# Patient Record
Sex: Female | Born: 1940 | Race: White | Hispanic: No | Marital: Single | Smoking: Former smoker
Health system: Southern US, Community
[De-identification: ages and names within clinical notes are randomized; demographics above are authoritative.]

## PROBLEM LIST (undated history)

## (undated) DIAGNOSIS — R079 Chest pain, unspecified: Secondary | ICD-10-CM

## (undated) DIAGNOSIS — Z860100 Personal history of colon polyps, unspecified: Secondary | ICD-10-CM

## (undated) DIAGNOSIS — E039 Hypothyroidism, unspecified: Secondary | ICD-10-CM

## (undated) DIAGNOSIS — Z9889 Other specified postprocedural states: Secondary | ICD-10-CM

## (undated) DIAGNOSIS — M069 Rheumatoid arthritis, unspecified: Secondary | ICD-10-CM

## (undated) DIAGNOSIS — J449 Chronic obstructive pulmonary disease, unspecified: Secondary | ICD-10-CM

## (undated) DIAGNOSIS — Z720 Tobacco use: Secondary | ICD-10-CM

## (undated) DIAGNOSIS — M8580 Other specified disorders of bone density and structure, unspecified site: Secondary | ICD-10-CM

## (undated) DIAGNOSIS — K5900 Constipation, unspecified: Secondary | ICD-10-CM

## (undated) DIAGNOSIS — N6019 Diffuse cystic mastopathy of unspecified breast: Secondary | ICD-10-CM

## (undated) DIAGNOSIS — M25571 Pain in right ankle and joints of right foot: Secondary | ICD-10-CM

## (undated) DIAGNOSIS — K649 Unspecified hemorrhoids: Secondary | ICD-10-CM

## (undated) DIAGNOSIS — M359 Systemic involvement of connective tissue, unspecified: Secondary | ICD-10-CM

## (undated) DIAGNOSIS — G894 Chronic pain syndrome: Secondary | ICD-10-CM

## (undated) DIAGNOSIS — F419 Anxiety disorder, unspecified: Secondary | ICD-10-CM

## (undated) DIAGNOSIS — E0789 Other specified disorders of thyroid: Secondary | ICD-10-CM

## (undated) DIAGNOSIS — R109 Unspecified abdominal pain: Secondary | ICD-10-CM

## (undated) DIAGNOSIS — N644 Mastodynia: Secondary | ICD-10-CM

## (undated) DIAGNOSIS — K219 Gastro-esophageal reflux disease without esophagitis: Secondary | ICD-10-CM

## (undated) DIAGNOSIS — R112 Nausea with vomiting, unspecified: Secondary | ICD-10-CM

## (undated) DIAGNOSIS — I701 Atherosclerosis of renal artery: Secondary | ICD-10-CM

## (undated) DIAGNOSIS — D649 Anemia, unspecified: Secondary | ICD-10-CM

## (undated) DIAGNOSIS — R0789 Other chest pain: Secondary | ICD-10-CM

## (undated) DIAGNOSIS — K5790 Diverticulosis of intestine, part unspecified, without perforation or abscess without bleeding: Secondary | ICD-10-CM

## (undated) DIAGNOSIS — K2970 Gastritis, unspecified, without bleeding: Secondary | ICD-10-CM

## (undated) HISTORY — DX: Diffuse cystic mastopathy of unspecified breast: N60.19

## (undated) HISTORY — PX: JOINT REPLACEMENT: SHX530

## (undated) HISTORY — DX: Anxiety disorder, unspecified: F41.9

## (undated) HISTORY — DX: Anemia, unspecified: D64.9

## (undated) HISTORY — DX: Atherosclerosis of renal artery: I70.1

## (undated) HISTORY — DX: Hypothyroidism, unspecified: E03.9

## (undated) HISTORY — DX: Other chest pain: R07.89

## (undated) HISTORY — DX: Constipation, unspecified: K59.00

## (undated) HISTORY — DX: Gastritis, unspecified, without bleeding: K29.70

## (undated) HISTORY — DX: Personal history of colon polyps, unspecified: Z86.0100

## (undated) HISTORY — DX: Unspecified abdominal pain: R10.9

## (undated) HISTORY — DX: Diverticulosis of intestine, part unspecified, without perforation or abscess without bleeding: K57.90

## (undated) HISTORY — DX: Chronic pain syndrome: G89.4

## (undated) HISTORY — PX: TONSILLECTOMY: SUR1361

## (undated) HISTORY — PX: LEG SURGERY: SHX1003

## (undated) HISTORY — DX: Pain in right ankle and joints of right foot: M25.571

## (undated) HISTORY — DX: Chest pain, unspecified: R07.9

## (undated) HISTORY — PX: CHOLECYSTECTOMY: SHX55

## (undated) HISTORY — DX: Other specified disorders of bone density and structure, unspecified site: M85.80

## (undated) HISTORY — DX: Mastodynia: N64.4

## (undated) HISTORY — DX: Chronic obstructive pulmonary disease, unspecified: J44.9

## (undated) HISTORY — DX: Unspecified hemorrhoids: K64.9

## (undated) HISTORY — DX: Gastro-esophageal reflux disease without esophagitis: K21.9

## (undated) HISTORY — DX: Personal history of colonic polyps: Z86.010

## (undated) HISTORY — DX: Tobacco use: Z72.0

## (undated) MED FILL — Ferumoxytol Inj 510 MG/17ML (30 MG/ML) (Elemental Fe): INTRAVENOUS | Qty: 17 | Status: AC

---

## 2001-07-12 ENCOUNTER — Ambulatory Visit (HOSPITAL_BASED_OUTPATIENT_CLINIC_OR_DEPARTMENT_OTHER): Admission: RE | Admit: 2001-07-12 | Discharge: 2001-07-12 | Payer: Self-pay | Admitting: Otolaryngology

## 2004-04-07 ENCOUNTER — Ambulatory Visit: Payer: Self-pay | Admitting: Unknown Physician Specialty

## 2004-04-13 ENCOUNTER — Ambulatory Visit: Payer: Self-pay | Admitting: Unknown Physician Specialty

## 2004-07-09 ENCOUNTER — Ambulatory Visit: Payer: Self-pay | Admitting: Cardiology

## 2004-07-28 ENCOUNTER — Ambulatory Visit: Payer: Self-pay | Admitting: Cardiology

## 2004-07-28 ENCOUNTER — Ambulatory Visit: Payer: Self-pay

## 2005-01-06 ENCOUNTER — Ambulatory Visit: Payer: Self-pay

## 2005-08-24 ENCOUNTER — Ambulatory Visit: Payer: Self-pay

## 2005-09-08 ENCOUNTER — Ambulatory Visit: Payer: Self-pay

## 2005-09-16 ENCOUNTER — Ambulatory Visit: Payer: Self-pay | Admitting: Orthopedic Surgery

## 2005-09-28 ENCOUNTER — Ambulatory Visit: Payer: Self-pay | Admitting: Orthopedic Surgery

## 2005-10-22 ENCOUNTER — Ambulatory Visit: Payer: Self-pay | Admitting: Internal Medicine

## 2005-11-16 ENCOUNTER — Ambulatory Visit: Payer: Self-pay | Admitting: Unknown Physician Specialty

## 2006-01-18 ENCOUNTER — Ambulatory Visit: Payer: Self-pay | Admitting: Internal Medicine

## 2006-06-21 ENCOUNTER — Ambulatory Visit: Payer: Self-pay | Admitting: Unknown Physician Specialty

## 2006-07-08 ENCOUNTER — Ambulatory Visit: Payer: Self-pay | Admitting: Cardiology

## 2006-07-21 ENCOUNTER — Ambulatory Visit: Payer: Self-pay | Admitting: Unknown Physician Specialty

## 2006-07-27 ENCOUNTER — Ambulatory Visit: Payer: Self-pay | Admitting: Internal Medicine

## 2006-08-01 ENCOUNTER — Ambulatory Visit: Payer: Self-pay

## 2006-08-23 ENCOUNTER — Ambulatory Visit: Payer: Self-pay | Admitting: Cardiology

## 2006-09-09 ENCOUNTER — Ambulatory Visit: Payer: Self-pay | Admitting: Pulmonary Disease

## 2006-11-02 ENCOUNTER — Ambulatory Visit: Payer: Self-pay | Admitting: Cardiology

## 2006-11-04 ENCOUNTER — Ambulatory Visit: Payer: Self-pay | Admitting: Pulmonary Disease

## 2006-11-30 ENCOUNTER — Encounter: Payer: Self-pay | Admitting: Cardiology

## 2006-11-30 ENCOUNTER — Ambulatory Visit: Payer: Self-pay

## 2007-01-05 ENCOUNTER — Ambulatory Visit: Payer: Self-pay | Admitting: Internal Medicine

## 2007-02-13 ENCOUNTER — Ambulatory Visit: Payer: Self-pay | Admitting: Internal Medicine

## 2007-02-15 ENCOUNTER — Ambulatory Visit: Payer: Self-pay | Admitting: Internal Medicine

## 2007-03-06 ENCOUNTER — Emergency Department (HOSPITAL_COMMUNITY): Admission: EM | Admit: 2007-03-06 | Discharge: 2007-03-07 | Payer: Self-pay | Admitting: Emergency Medicine

## 2007-03-08 ENCOUNTER — Ambulatory Visit: Payer: Self-pay | Admitting: Internal Medicine

## 2007-03-08 LAB — CONVERTED CEMR LAB: IgA: 135 mg/dL (ref 68–378)

## 2007-03-17 ENCOUNTER — Ambulatory Visit: Payer: Self-pay | Admitting: Internal Medicine

## 2007-03-17 LAB — CONVERTED CEMR LAB: BUN: 9 mg/dL (ref 6–23)

## 2007-03-24 ENCOUNTER — Ambulatory Visit: Payer: Self-pay | Admitting: Cardiology

## 2007-04-07 ENCOUNTER — Ambulatory Visit: Payer: Self-pay | Admitting: Cardiology

## 2007-04-10 ENCOUNTER — Ambulatory Visit: Payer: Self-pay | Admitting: Internal Medicine

## 2007-08-03 DIAGNOSIS — G894 Chronic pain syndrome: Secondary | ICD-10-CM

## 2007-08-03 DIAGNOSIS — Z860100 Personal history of colon polyps, unspecified: Secondary | ICD-10-CM | POA: Insufficient documentation

## 2007-08-03 DIAGNOSIS — Z8719 Personal history of other diseases of the digestive system: Secondary | ICD-10-CM

## 2007-08-03 DIAGNOSIS — D126 Benign neoplasm of colon, unspecified: Secondary | ICD-10-CM

## 2007-08-03 DIAGNOSIS — M949 Disorder of cartilage, unspecified: Secondary | ICD-10-CM

## 2007-08-03 DIAGNOSIS — K2970 Gastritis, unspecified, without bleeding: Secondary | ICD-10-CM | POA: Insufficient documentation

## 2007-08-03 DIAGNOSIS — R0789 Other chest pain: Secondary | ICD-10-CM

## 2007-08-03 DIAGNOSIS — K649 Unspecified hemorrhoids: Secondary | ICD-10-CM | POA: Insufficient documentation

## 2007-08-03 DIAGNOSIS — K2990 Gastroduodenitis, unspecified, without bleeding: Secondary | ICD-10-CM

## 2007-08-03 DIAGNOSIS — M899 Disorder of bone, unspecified: Secondary | ICD-10-CM | POA: Insufficient documentation

## 2007-08-03 DIAGNOSIS — K5730 Diverticulosis of large intestine without perforation or abscess without bleeding: Secondary | ICD-10-CM | POA: Insufficient documentation

## 2007-08-03 DIAGNOSIS — N6019 Diffuse cystic mastopathy of unspecified breast: Secondary | ICD-10-CM | POA: Insufficient documentation

## 2007-08-03 DIAGNOSIS — F411 Generalized anxiety disorder: Secondary | ICD-10-CM | POA: Insufficient documentation

## 2007-09-26 ENCOUNTER — Encounter: Payer: Self-pay | Admitting: Internal Medicine

## 2008-01-16 ENCOUNTER — Ambulatory Visit: Payer: Self-pay | Admitting: Vascular Surgery

## 2008-07-02 ENCOUNTER — Ambulatory Visit: Payer: Self-pay | Admitting: Cardiology

## 2009-01-09 ENCOUNTER — Telehealth: Payer: Self-pay | Admitting: Cardiology

## 2009-08-27 DIAGNOSIS — Z8639 Personal history of other endocrine, nutritional and metabolic disease: Secondary | ICD-10-CM

## 2009-08-27 DIAGNOSIS — I701 Atherosclerosis of renal artery: Secondary | ICD-10-CM | POA: Insufficient documentation

## 2009-08-27 DIAGNOSIS — J449 Chronic obstructive pulmonary disease, unspecified: Secondary | ICD-10-CM | POA: Insufficient documentation

## 2009-08-27 DIAGNOSIS — Z862 Personal history of diseases of the blood and blood-forming organs and certain disorders involving the immune mechanism: Secondary | ICD-10-CM

## 2009-08-27 DIAGNOSIS — F17200 Nicotine dependence, unspecified, uncomplicated: Secondary | ICD-10-CM | POA: Insufficient documentation

## 2009-08-27 DIAGNOSIS — E039 Hypothyroidism, unspecified: Secondary | ICD-10-CM | POA: Insufficient documentation

## 2009-08-27 DIAGNOSIS — J4489 Other specified chronic obstructive pulmonary disease: Secondary | ICD-10-CM | POA: Insufficient documentation

## 2009-08-27 DIAGNOSIS — K219 Gastro-esophageal reflux disease without esophagitis: Secondary | ICD-10-CM | POA: Insufficient documentation

## 2009-09-01 ENCOUNTER — Ambulatory Visit: Payer: Self-pay | Admitting: Cardiology

## 2009-10-01 ENCOUNTER — Ambulatory Visit: Payer: Self-pay | Admitting: Cardiology

## 2010-02-04 ENCOUNTER — Ambulatory Visit: Payer: Medicare Other | Admitting: Internal Medicine

## 2010-05-06 ENCOUNTER — Encounter (INDEPENDENT_AMBULATORY_CARE_PROVIDER_SITE_OTHER): Payer: Self-pay | Admitting: *Deleted

## 2010-07-09 NOTE — Letter (Signed)
Summary: Appointment - Missed  [facility], Main Office  [address]   [place], [place] [address]   Phone: [phone]  Fax: [phone]     May 06, 2010 MRN: [id number]   ESABELLA STOCKINGER [address] [place], [place]  [address]   Dear Ms. Erling Conte,  Our records indicate you missed your appointment in September with Dr. Riley Kill. It is very important that we reach you to reschedule this appointment. We look forward to participating in your health care needs. Please contact us at the number listed above at your earliest convenience to reschedule this appointment.     Sincerely, Neurosurgeon Team LG

## 2010-07-09 NOTE — Assessment & Plan Note (Signed)
Summary: F1Y   Visit Type:  1 year follow up  CC:  Pain on center of breast.  History of Present Illness: Has chronic acid reflux.  Took therapy for pelvic floor muscles, so she had physical therapy.  SHe has lost a fair amount of weight.   Took therapy for rectum, and was able to start having BM's again.  She gets some chest discomfort, mostly related to when her bowels are not working appropriately.   She still does smoke, probably less than a pack per day.    Current Medications (verified): 1)  Ascorbic Acid 250 Mg Tabs (Ascorbic Acid) .... Take 1 Tablet By Mouth Once A Day 2)  Calcium 600+d Plus Minerals 600-400 Mg-Unit Tabs (Calcium Carbonate-Vit D-Min) .... Take 1 Tablet By Mouth Once A Day 3)  Vitamin D 1000 Unit Tabs (Cholecalciferol) .... Take 1 Tablet By Mouth Once A Day 4)  Premarin 0.625 Mg/gm Crea (Estrogens, Conjugated) .... As Directed 5)  Hydroxychloroquine Sulfate 200 Mg Tabs (Hydroxychloroquine Sulfate) .... 2 Tablets By Mouth Once A Day 6)  Levothyroxine Sodium 50 Mcg Tabs (Levothyroxine Sodium) .... Take 1 Tablet By Mouth Once A Day 7)  Methotrexate 2.5 Mg Tabs (Methotrexate Sodium) .... 8 Tablets Once A Week 8)  Multivitamins  Tabs (Multiple Vitamin) .... Take 1 Tablet By Mouth Once A Day 9)  Nexium 40 Mg Cpdr (Esomeprazole Magnesium) .... Two Times A Day 10)  Prednisone 1 Mg Tabs (Prednisone) .... Take 1 Tablet By Mouth Once A Day 11)  Zantac 150 Mg Tabs (Ranitidine Hcl) .... Take 1 Tablet By Mouth Once A Day 12)  Sertraline Hcl 50 Mg Tabs (Sertraline Hcl) .... Take 1 Tablet By Mouth Once A Day 13)  Trazodone Hcl 50 Mg Tabs (Trazodone Hcl) .... At Bedtime 14)  Cymbalta 20 Mg Cpep (Duloxetine Hcl) .... At Bedtime 15)  Align  Caps (Probiotic Product) .... As Needed 16)  Cyclobenzaprine Hcl 10 Mg Tabs (Cyclobenzaprine Hcl) .... As Needed 17)  Ibuprofen 600 Mg Tabs (Ibuprofen) .... As Needed  Allergies (verified): 1)  ! * Symmetral 2)  ! * Etrafon 3)  ! *  Desbatal  Vital Signs:  Patient profile:   70 year old female Height:      65 inches Weight:      103.50 pounds BMI:     17.29 Pulse rate:   78 / minute Pulse rhythm:   regular Resp:     18 per minute BP sitting:   124 / 82  (left arm) Cuff size:   regular  Vitals Entered By: Vikki Ports (September 01, 2009 3:25 PM)  Physical Exam  General:  Well developed, well nourished, in no acute distress. Head:  normocephalic and atraumatic Eyes:  PERRLA/EOM intact; conjunctiva and lids normal. Lungs:  Prolonged expiration. Heart:  NOrmal S1 and S2.  No murmur, rub, or gallop. Abdomen:  Bowel sounds positive; abdomen soft and non-tender without masses, organomegaly, or hernias noted. No hepatosplenomegaly.  No abdominal bruit.   Rectal:  normal external exam   EKG  Procedure date:  09/01/2009  Findings:      NSR. WNl.  Impression & Recommendations:  Problem # 1:  CHEST PAIN, ATYPICAL (ICD-786.59) symptoms likely from reflux.  Not likely cardiac.  Will do POET study  (routine treadmill).  Nuclear study in 2008 was not ischemic, with normal perfusion and EF 78%, 7.5 minutes of exercise, normal contractility.  Therefore, simple GXT.  Discussion re: mechanism of ACS. Orders: EKG w/ Interpretation (93000)  Treadmill (Treadmill)  Problem # 2:  TOBACCO ABUSE (ICD-305.1) Clearly needs to stop.  Long discussion about this. Orders: EKG w/ Interpretation (93000) Treadmill (Treadmill)  Patient Instructions: 1)  Your physician recommends that you continue on your current medications as directed. Please refer to the Current Medication list given to you today. 2)  Your physician has requested that you have an exercise tolerance test.  For further information please visit [email or web address]  Please also follow instruction sheet, as given.  Appended Document: F1Y BP is normal.  Has no abdominal bruit.  Of note, CT previously revealed LRA with calcified lesion of 80%.

## 2010-10-20 NOTE — Assessment & Plan Note (Signed)
[facility]                            CARDIOLOGY OFFICE NOTE   NAME:[name], [name]                           MRN:          [id number]  DATE:11/02/2006                            DOB:          05-03-1941    [name] is in for follow-up.  She was unable to see the  gastroenterologist as they would not take her referral.  She has been  seen previously in [facility] and wants to be seen by [facility].  From a cardiac standpoint, she has not had any chest  pain or significant shortness of breath.  Her myocardial perfusion study  was not markedly abnormal.  She has seen a pulmonary doctor.  She does  continue to smoke about 15 cigarettes a day.   PHYSICAL EXAMINATION:  VITAL SIGNS:  Today blood pressure 116/70, pulse  79, there is slight decrease in prolonged expiration.  HEART:  Rhythm is regular.  NECK:  There are no carotid bruits.   EKG reveals normal sinus rhythm with incomplete right bundle branch  block.   We plan to get a two-dimensional echocardiogram on her and I will see  her back in follow-up in 6 months.  She will continue to follow up in  pulmonary medicine and we will make a referral to GI.     Arturo Morton. Riley Kill, MD, [facility]  Electronically Signed    TDS/MedQ  DD: 11/02/2006  DT: 11/02/2006  Job #: [id number]

## 2010-10-20 NOTE — Assessment & Plan Note (Signed)
[facility]                         GASTROENTEROLOGY OFFICE NOTE   NAME:[name], [name]                    MRN:          [id number]  DATE:02/13/2007                            DOB:          12/22/1940    CHIEF COMPLAINT:  Followup constipation.   Ms. [name] tried a MiraLax purge and MiraLax daily, but over time that  did not seem to help.  She increased the MiraLax to twice a day and  eventually ended up adding 2 Dulcolax a day, and for the past several  days of that she has had some bowel movements.  She feels she had very  infrequent bowel movements for a few weeks prior to this. She gets lower  abdominal pain radiating up into the chest and a lot of gas.  This is  making her life somewhat difficult.  She has been started on  methotrexate in the interim.  Her other medications are reviewed and  otherwise unchanged from my notes.  She has questions about whether or  not she should take Protonix given that she has osteoporosis.  She did  not start Boniva as recommended by Dr. Judithann Sheen.  She says her thyroid  problem is just mildly low.  She wonders about seeing an  endocrinologist about that.  She does not seem to have any other new  symptoms.  In fact, these symptoms are fairly chronic and similar to  what she has had before.   PHYSICAL EXAMINATION:  VITAL SIGNS:  She is 5 feet 5 inches, weight 109  pounds, looking appropriate for age.  Pulse 86, blood pressure 86/58.  ABDOMEN: Soft, not very distended if at all.  Bowel sounds are present.  There is some mild tenderness in the right and left lower quadrant  without mass.   ASSESSMENT:  This lady has constipation, predominantly irritable bowel  syndrome.  She is perhaps a little better at this time, but the MiraLax  did not work as well as I had hoped. It sounds like she has been on  Amitiza in the past, perhaps at the 24 mcg dose, and it caused nausea.  The bloating and gaseousness suggest the  possibility of bacterial  overgrowth.   PLAN:  1. I think a trial of Xifaxan 400 mg b.i.d. for 10 days is worth a      try.  2. Continue the current regimen.  We will try to go to every other day      with the Dulcolax.  3. Consider low-dose Amitiza depending upon these results.  4. Further plans pending the above.  I will see her back in 2 months,      but she is to contact me after she completes the 10-day course of      Xifaxan to see if that helps.  She is taking some sort of a      probiotic.  I had given her Align samples, but she switched to      something different.  5. Since she has gastroesophageal reflux disease, it sounds to me like      she should continue her  proton pump inhibitor therapy.  I think      that perhaps Prednisone and smoking did more to cause osteoporosis      than Protonix, though that is a risk factor.  She should take her      Boniva, I think, and I have asked her to follow up with Dr. Judithann Sheen      about that.  6. If she is not on calcium and vitamin D, that would be appropriate.      If she is on that, probably needs more and will discuss that with      her when she comes back.     Iva Boop, MD,FACG  Electronically Signed    CEG/MedQ  DD: 02/13/2007  DT: 02/13/2007  Job #: [id number]   cc:   Aram Beecham, MD

## 2010-10-20 NOTE — Assessment & Plan Note (Signed)
[facility]                         GASTROENTEROLOGY OFFICE NOTE   NAME:[name], [name]                           MRN:          [id number]  DATE:04/10/2007                            DOB:          [birth year]    CHIEF COMPLAINT:  Followup of constipation.   HISTORY OF PRESENT ILLNESS:  She continues to have problems with severe  constipation.  Her weight is really stable at this point since September  at 109 pounds.  I went ahead with a CT scan angiogram which showed no  mesenteric problems.  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  She tells me Dr. Riley Kill did talk to  her about this and indicated that he would not do anything further.  I  think that makes sense.  Of note, she was seen in October as well and  she was 107 pounds.   MEDICATIONS:  Medications are listed and reviewed in the chart.  1. MiraLax two scoops a day, sometimes one.  2. Dulcolax suppositories and pills intermittently.  3. Protonix 40 mg daily.  4. Caltrate Plus D 600 daily.  5. Centrum Silver daily.  6. Vitamin C 500 mg daily.   MEDICATION ALLERGIES:  Reviewed and updated.   PAST MEDICAL HISTORY:  Reviewed and updated, unchanged from previously.   Note, she has been taking something every day because she feels like she  has inadequate defecation and small stools.  There is a variety of  laxatives she has been using.  Lately, she has been taking extra MiraLax  and Dulcolax tablets.  Sometimes a Dulcolax suppository.  She tells me  Fleet's enemas do not really seem to work.   I reviewed the CT angio with her.  She was on Amitiza in the past at 24  mcg that caused profound nausea.   ASSESSMENT:  Constipation predominant irritable bowel syndrome.  Refractory to typical therapies.  We have tried antibiotics.  She has  tried high dose Amitiza. She has tried Clinical biochemist.   She had previously had  endoscopic evaluations, see my note of January 05, 2007, elsewhere.  Nothing seems to have helped her.  These are chronic problems.  She had  a 5 mm adenoma in 2005.   She has no antibodies for Celiac disease. It was negative.  She also had  a colonoscopy in 2008 which was negative as far as polyps or cancer.  She had internal hemorrhoids, diverticulosis and a tortuous colon.   RECOMMENDATIONS/PLANS:  1. At this point we will try the low dose Amitiza, 8 mcg b.i.d.  2. Also continue her regimen of MiraLax twice a day.  She add Dulcolax      suppositories and perhaps an enema using an enema bag with a 500 cc      warm water enema to see if that helps.  She will be referred to      [facility], with Dr. Dorita Fray, who is a constipation expert.  Note:  I thought I had done a Sitzmark study but that has not been      done.  I will defer this to the physicians in [place] to do so.      She may need an anorectal manometry.  She understands the plan.     Iva Boop, MD,FACG  Electronically Signed    CEG/MedQ  DD: 04/10/2007  DT: 04/11/2007  Job #: [id number]   cc:   [facility]  Aram Beecham, M.D.  Arturo Morton. Riley Kill, MD, [facility]

## 2010-10-20 NOTE — Assessment & Plan Note (Signed)
[facility]                         GASTROENTEROLOGY OFFICE NOTE   NAME:[name], [name]                           MRN:          [id number]  DATE:03/08/2007                            DOB:          08-08-1940    CHIEF COMPLAINT:  Persistent gas and bloating and constipation. See  previous note.   I have her Xifaxan for 10 days. That did not seem to help. She still has  these attacks as what she describes as gas with severe diffuse abdominal  pain and bloating. She is using Dulcolax 2 tablets a day and MiraLax  twice a day and complains that she is not really moving her bowels. She  has these watery small bowel movements. She chronically feels sore in  her abdomen. In the ER, they gave her a GI cocktail and an enema. It did  not provide relief. It had been several days before she had what she  would think was an adequate bowel movement.   MEDICATIONS:  Are listed and reviewed in the chart. Otherwise, she is  confused about Protonix and a probiotic that she is taking; she is  taking some type of a homeopathic probiotic after I had previously given  her Align which she says did not help. She says that she has had these  problems for 30 years and is concerned about them still.   She has also tried some coffee enemas without relief. She says she is  not really using any other homeopathic remedies.   PHYSICAL:  Weight 107 pounds, pulse 68, blood pressure 104/68. The  abdomen is soft. She is diffusely tender to superficial palpation  without organomegaly or mass.   ASSESSMENT:  This is most consistent with irritable-bowel phenomenon  with constipation-predominant irritable bowel and some alternating  habits at times.   PLAN:  1. She should not use the Dulcolax every day, waiting every few days      if she has not had a decent bowel habit to try that.  2. She is to continue the MiraLax.  3. Check celiac sprue antibiotics with tissue transglutaminase  antibody and an IgA level.  4. Abdominal film, two view, to be taken.  5. I have discussed low-dose amitiza. She had a terrible time with 24      mcg and said she did not move her bowels with that.  6. Trial of Florastor daily.  7. Telephone call pending results of these labs. She may need to go to      the [facility] functional bowel disorders clinic. She does not      really have obvious post-prandial pain and persistent weight loss,      so given that and the chronicity of these symptoms, ischemia is      less likely. She has lost some weight over the last 1-2 yrs. CT      angiogram of abdomen may be appropriate. Adhesions are another      possibility, though she had a small-bowel series a      couple of years ago.  8. Add dicyclomine 20  mg p.r.n.     Pamela Boop, MD,FACG  Electronically Signed    CEG/MedQ  DD: 03/08/2007  DT: 03/09/2007  Job #: [id number]   cc:   Aram Beecham, M.D.

## 2010-10-20 NOTE — Consult Note (Signed)
VASCULAR SURGERY CONSULTATION   [name], [name]  DOB:  1940-10-07                                       01/16/2008  HYQMV#:[id number]   I saw the patient in the office today in consultation concerning a left  renal artery stenosis.  She was referred by Dr. Vincente Poli.  This is a  pleasant 70 year old woman who has been having problems with chronic  abdominal pain, weight loss and loss of appetite.  On 11/20/2007 she  underwent a CT angiogram to work this up.  The celiac axis and SMA were  noted to be widely patent.  She was noted to have two renal arteries on  the right and two renal arteries on the left.  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  She had only mild aortoiliac atherosclerotic disease.   The patient states she has really never had problems with her blood  pressure and has no history of renal insufficiency.  She has had no  hematuria.  She is currently on no medications for her blood pressure.  She has apparently undergone a fairly extensive workup from a GI  standpoint and was told that she has a slow colon.  She also has  reflux and apparently some spasm in the rectum.   She denies any history of claudication, rest pain, nonhealing ulcers.  She has had no history of DVT or phlebitis.   PAST MEDICAL HISTORY:  Is fairly unremarkable.  She denies any history  of diabetes, hypertension, hypercholesterolemia, history of previous  myocardial infarction, history of congestive heart failure or history of  COPD.   FAMILY HISTORY:  She had a brother who had open heart surgery in his 79s  for coronary artery disease.   SOCIAL HISTORY:  She is married.  She has one child.  She smokes a pack  per day of cigarettes, has been smoking for as long she can remember.   Her review of systems and medications are documented on the medical  history form in her chart.   PHYSICAL EXAMINATION:  General:  This  is a pleasant 70 year old woman  who appears her stated age.  Vital signs:  Blood pressure is 101/70,  heart rate is 89.  Neck:  Is supple.  There is no cervical  lymphadenopathy.  I do not detect any carotid bruits.  Lungs:  Are clear  bilaterally to auscultation.  Cardiac:  She has a regular rate and  rhythm.  Abdomen:  Soft and nontender.  I cannot palpate an aneurysm.  I  cannot appreciate an abdominal bruit.  Vascular:  She has palpable  femoral, popliteal and pedal pulses.  I cannot appreciate femoral  bruits.  She has no significant lower extremity swelling.   I think that her left inferior pole renal artery stenosis is  asymptomatic and for this reason would not recommend any further workup  such as arteriography or consideration for intervention.  Likewise the  CT angiogram did not show any evidence of significant mesenteric artery  occlusive disease and I feel it is unlikely that her symptoms are  related to this given these findings.  The films were done at the  [facility] office and therefore I do not have access to the actual films  but the patient is willing to bring  these in for me to review and I  would be happy to review these once I have the actual films.  Finally,  she does complain of some leg pain at times.  However, I do not get any  clear-cut history of claudication, rest pain or nonhealing ulcers.  She  has palpable pedal pulses.  If in the future if she develops problems  with her blood pressure or evidence of renal insufficiency then I would  recommend further evaluation of the renal artery stenosis.  I will be  happy to see her back if any new vascular issues arise.   Di Kindle. Edilia Bo, M.D.  Electronically Signed  CSD/MEDQ  D:  01/16/2008  T:  01/17/2008  Job:  [id number]   cc:   Marcelino Duster L. Vincente Poli, M.D.  Dr Charyl Dancer

## 2010-10-20 NOTE — Letter (Signed)
April 07, 2007    Rica Records, MD  [address]  [place], [place] [address]   RE:  [name], SINGLE  MRN:  [id number]  /  DOB:  May 22, 1941   Dear Dr. Judithann Sheen:   I had the pleasure of seeing [name] in the office today in followup.  She really is asymptomatic from a cardiac standpoint.  She has seen the  pulmonologist here and she has been seeing gastroenterology for chronic  constipation.  They have made some changes in her medicines.  By CT  scanning, she does have a short segment in the inferior renal artery.  She has not been hypertensive, nor has she had significant renovascular  disease.  She also has mild aortoiliac atheromatous changes without  stenosis.  She feels well.   CURRENT MEDICATIONS:  1. Vitamin C 500 mg daily.  2. Centrum Silver daily.  3. Caltrate 600 D+.  4. Dulcolax daily.   PHYSICAL:  The blood pressure is 110/76, pulse 80.  There is slight decrease in breath sounds with some prolonged  expiration.  Cardiac rhythm is regular without a significant murmur.  There is no extremity edema.   ELECTROCARDIOGRAM:  Demonstrates normal sinus rhythm with incomplete  right bundle branch block.   I have strongly encouraged her and spent quite a bit of time with her  counseling her on smoking cessation.  Also, I think she needs a lipid  profile, and I thought it would be most appropriate to do this in your  office.  I have asked her to contact you for this.  With the renal  artery stenosis, if  her LDL is elevated, it would be reasonable, it seems, to consider  reasonably aggressive therapy for this.  Certainly, I would leave this  to your discretion.  Thanks for allowing me to share in her care.    Sincerely,      Arturo Morton. Riley Kill, MD, [facility]  Electronically Signed    TDS/MedQ  DD: 04/07/2007  DT: 04/08/2007  Job #: [id number]   CC:    Iva Boop, MD,FACG

## 2010-10-20 NOTE — Assessment & Plan Note (Signed)
[facility]                             PULMONARY OFFICE NOTE   NAME:[name]                           MRN:          [id number]  DATE:11/04/2006                            DOB:          June 26, 1940    I saw [name] in followup today for her tobacco abuse.   Since her last visit, she had undergone pulmonary function tests.  These  showed a post-bronchodilator FEV1/FVC ratio of 76%.  Her FEV1 was 2.52,  which was 11.2% of predicted. Her FVC was 3.32, which was 108%  predicted.  There was no significant bronchodilator response.  Her total  lung capacity was 5.30 liters, which was 1.05% predicted.  Her diffusion  capacity was 99% predicted.  Overall, these results are consistent with  the essential normal pulmonary function tests.   She says that her breathing was reasonably stable.  She has not started  the use of the Spiriva that I had given her at her last visit.  She had  apparently confused this with Advair and was concerned that she could  develop osteoporosis from this.  She also says that she had bought the  nicotine patches but never actually started using them.  She continues  to smoke cigarettes.   I have had an extensive conversation with her again regarding the  importance of smoking cessation.  I have emphasized to her that while we  did not see any evidence for significant air flow obstruction on her  pulmonary function tests from today, it is certainly possible that her  breathing symptoms and her pulmonary function tests could get worse in  the future if she did not stop smoking.   I have given her a prescription for Chantix, and I have discussed with  her the use of this as well as the possible side effects.  She says that  she would like to think about whether she would want to try using the  Chantix or not.  I have advised her that if she decides to use the  Chantix, she should call to schedule a follow-up appointment with me  about 4-6 weeks after she starts using the medication, otherwise I plan  on following up with her in approximately one year, at which time I  would like for her to undergo a follow-up chest x-ray.     Coralyn Helling, MD  Electronically Signed    VS/MedQ  DD: 11/04/2006  DT: 11/04/2006  Job #: [id number]   cc:   Arturo Morton. Riley Kill, MD, [facility]

## 2010-10-20 NOTE — Assessment & Plan Note (Signed)
[facility]                         GASTROENTEROLOGY OFFICE NOTE   NAME:[name], [name]                MRN:          [id number]  DATE:01/05/2007                            DOB:          16-Jul-1940    REQUESTING PHYSICIAN:  Shawnie Pons.   REASON FOR CONSULTATION:  Gas and constipation.   ASSESSMENT:  A 70 year old white woman with what sounds like  constipation-predominant irritable bowel syndrome.  She has had multiple  workups in the past.  It clearly seems like a functional syndrome to me.   RECOMMENDATIONS AND PLAN:  1. She is somewhat obstipated at this time.  She is to try a MiraLax      purge and then 1-2 doses of MiraLax daily on a regular basis.  If      that does not help, she is to call me back.  2. A trial of Align daily to try to alleviate gas and bloating.  3. Return to see me in 1 month for reassessment.   HISTORY:  [name] is complaining of problems of epigastric pain,  distension and bloating associated with difficulty in defecation.  There  has been, occasionally, nausea and vomiting at times of small amounts.  She has had some bright red blood per rectum after an enema.  Her  husband indicates she has tried some MiraLax and other laxatives like  that, but she has not been on something consistently.  She had been on  doxepin for years for insomnia, and she believes that that has  contributed to some of her problems.  She has had extensive workup  elsewhere.  She believes she has been losing some weight.  When she does  move her bowels she gets relief.  There is no real spontaneous bleeding  that I am aware of.  She is not describing any diarrhea.  There is no  reflux, there is no dysphagia, i.e., no classic reflux symptoms that I  can see at this time.  She is on Protonix.  Part of her previous workup  through Dr. Mechele Collin, the gastroenterologist at the [facility] in  [place], she has had a gastric emptying study  on June 21, 2006  which showed rapid emptying with 100% of administered meal clearing at  95 minutes.  Abdominal films with nonspecific gas patterns.  Multiple  times for the x-rays.  Upper GI with small bowel, November 16, 2005, showed  a small amount of reflux, a small duodenal diverticulum, otherwise  normal exam to the ileum.  CT abdomen and pelvis, August 24, 2005, normal  without significant abnormality.  Colonoscopy, July 21, 2006,  internal hemorrhoids, sigmoid diverticulosis, tortuous colon which  required good sedation and external pressure.  She was given 100 mg  phentanyl, 5 mg midazolam, 12.5 promethazine, and 4 mg Zofran.  Exam was  to the terminal ileum.  Upper GI endoscopy, July 21, 2006, showed  small hiatus hernia about 2 cm with EG junction at 38 cm.  Patchy,  erythematous mucosa in the gastric antrum was found and biopsies.  Biopsies demonstrated reactive changes with smooth muscle ingrowth into  the lamina.  She had previously had a colonoscopy in 2005 with a  tubular adenoma at that point.   CURRENT MEDICATIONS:  1. Celebrex 200 mg daily.  2. Plaquenil 400 mg daily.  3. Prednisone 1 mg daily.  4. Cymbalta 30 mg daily.  5. Synthroid 50 mcg daily.  6. Protonix 40 mg 2 each day.  7. Trazodone 150 mg at bedtime.  8. Folic acid 1 mg daily.  9. Flexeril has been used p.r.n.   DRUG ALLERGIES:  SYMMETREL, ETRAFON, DESTAL.   Additional supplements include vitamin C, Centrum Silver, and Caltrate  Plus.   PAST MEDICAL HISTORY:  1. As described in the HPI.  2. Hypothyroidism.  3. Chronic obstructive pulmonary disease.  4. Gastroesophageal reflux disease.  5. Anxiety.  6. Chronic constipation/irritable bowel syndrome.  7. Adenomatous polyp of the colon, 2005, none 2008.  8. Diverticulosis.  9. Hiatal hernia.  10.Atypical chest pain.  11.Bladder polyps.  12.Fibrocystic breast disease.  13.Rheumatoid arthritis.  14.Chronic pain syndrome.  15.Osteopenia.   16.Prior abnormal Pap smears.  17.History of abnormal pancreas on CT scan but negative endoscopic      ultrasound, Dr. Daisey Must, [facility], 2000.  18.Prior cholecystectomy.  19.Prior appendectomy.  20.Prior oophorectomy.  21.Prior cataract surgery.   FAMILY HISTORY:  Brother with heart disease, brother with prostate  cancer, half sister has diabetes, no colon polyps or cancer.   SOCIAL HISTORY:  The patient is divorced.  She is here with her  companion (I inadvertently said husband in the previous HPI).  She has 1  daughter.  She smokes a pack per day.  No alcohol or drug use.   REVIEW OF SYSTEMS:  Chronic back and joint pain, frequent cough,  anxiety,   PHYSICAL EXAMINATION:  Physical exam reveals a thin but well-developed,  middle-aged white woman in no acute distress.  Weight 109 pounds, blood pressure 110/68, pulse 60.  EYES:  Anicteric.  ENT:  Normal mouth, posterior pharynx.  NECK:  Supple, no thyromegaly or mass.  CHEST:  Clear.  HEART:  S1, S2, no murmurs, rubs, or gallops.  ABDOMEN:  Shows some mild tenderness below the umbilicus and some  fullness there.  I think this is probably some palpable colon, actually.  There is no discrete mass, there is no other organomegaly or mass.  RECTAL EXAM:  In the presence of female medical staff shows normal tone,  good squeeze, no abnormal distention, no mass, no rectocele.  EXTREMITIES:  No edema.  SKIN:  No rash that I can see.  PSYCH:  She is alert and oriented x3.  Appropriate affect, perhaps  slightly anxious, but, overall, normal.   I have reviewed multiple medical records from the [facility].  Note  that she has seen Dr. Caryl Ada at [facility] as well in the past.  Her  primary care physician is Dr. Aram Beecham.   Would be interesting to know if she has ever been tested with antibodies  for celiac disease.  I suspect she has, but we will try to clarify that  when she returns.     Iva Boop, MD,FACG  Electronically Signed    CEG/MedQ  DD: 01/11/2007  DT: 01/11/2007  Job #: [id number]   cc:   Arturo Morton. Riley Kill, MD, [facility]  Aram Beecham, MD

## 2010-10-20 NOTE — Assessment & Plan Note (Signed)
[facility]                            CARDIOLOGY OFFICE NOTE   NAME:[name]                  MRN:          [id number]  DATE:07/02/2008                            DOB:          07-Jul-1940    [name] is here for a followup visit.  She generally is quite stable.  She is not having any ongoing chest pain.  She does have a little bit of  muscle aches from time to time.  They had placed her on methotrexate for  her rheumatoid arthritis and she got scared and stopped it.  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  This was noted by CT  scanning.  She has not been significantly hypertensive in the past and  she has seen Dr. Waverly Ferrari in followup about this.  He has  recommended conservative course of management.   She also had some discomfort behind the sternum, which she thinks may be  related to indigestion.  There is absolutely no diaphoresis or shortness  of breath.  It is there for some prolonged period.  She does gets hoarse  at times.   Her medications include:  1. Pantoprazole 40 mg.  2. Fish oil daily.  3. __________ daily.  4. Plaquenil 200 mg two tablets daily.  5. __________ 1 daily.  6. Nicotine patch which she is using.  7. Vitamin C.  8. Centrum Silver.  9. Caltrate.  10.Celebrex 200 mg daily.  11.Prednisone 10 mg daily.  12.Synthroid 50 mcg daily.  13.Zoloft.  14.Dulcolax.  15.Vitamin D.  16.Finally, she is also taking a trazodone at night.  It has been cut      back because of constipation.   PHYSICAL EXAMINATION:  VITAL SIGNS:  Today, blood pressure 130/90, the  weight is 103 pounds, the pulse is 72.  HEENT:  Carotid upstrokes are brisk without bruits.  LUNGS:  The lung fields are clear.  There is no definite tenderness on  the chest noted.  EXTREMITIES:  No edema.  The blood pressure is equal in both arms.   The EKG  reveals sinus rhythm, incomplete right bundle.   IMPRESSION:  1. Continued tobacco use.  2. Evidence of renal artery stenosis without significant hypertension.  3. History of constipation.  4. Rheumatoid arthritis.   PLAN:  1. Return to clinic in 1 year.  2. At that time, a renal artery ultrasound will be recommended for      followup.     Arturo Morton. Riley Kill, MD, [facility]  Electronically Signed    TDS/MedQ  DD: 07/02/2008  DT: 07/03/2008  Job #: [id number]

## 2010-10-23 ENCOUNTER — Ambulatory Visit: Payer: Medicare Other | Admitting: Unknown Physician Specialty

## 2010-10-23 NOTE — Assessment & Plan Note (Signed)
[facility]                            CARDIOLOGY OFFICE NOTE   NAME:[name]                           MRN:          [id number]  DATE:07/08/2006                            DOB:          [birth year]    [name] is sent back through the courtesy of Dr. Lynnae Prude.  She  is a 70 year old female who has had a history of intermittent chest  discomfort.  The discomfort for the most part is not exertional,  although she did have some on the treadmill the other night.  She has  had a gastric emptying study and has had an extensive GI workup.  She is  scheduled for endoscopy on February 14.  Unfortunately the patient  smokes about 15-20 cigarettes a day; has a chronic inflammatory disease  in the form of rheumatoid arthritis for which she is on prednisone,  Plaquenil, and Celebrex; and also has a family history of heart disease  particularly in her brother.  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  She is seen at [facility] for her rheumatoid arthritis and has  been on a prednisone taper.  When she gets these episodes she becomes  hot and sweaty.  She takes Gaviscon which relieves some of the symptoms  and often she throws up.   PAST MEDICAL HISTORY:  Outlined previously in the chart.   MEDICATIONS:  1. Celebrex 200 mg daily.  2. Macrobid 100 mg p.r.n.  3. Plaquenil 200 mg two tablets daily.  4. Prednisone 1 mg daily.  5. Cymbalta 30 mg daily.  6. Synthroid 50 mcg daily.  7. Protonix 40 mg two tablets daily.  8. Trazodone.  9. Folic acid.   PHYSICAL EXAMINATION:  Blood pressure is 120/78, the pulse is 78.  LUNGS:  Fields are clear.  CARDIAC:  Rhythm is regular, the PMI is nondisplaced.  There is normal  first and second heart sounds, there is no murmurs, rubs, or gallops.   The electrocardiogram demonstrates normal sinus rhythm and essentially  within normal limits.   IMPRESSION:  1. Chest pain.  More than likely, the  patient's pain is not cardiac in      origin.  She has had this for quite some time, it is not clearly      exertion related.  Despite occasional and prior severe events she      has never had any known cardiac event and her current      electrocardiogram is unremarkable.  2. She has rheumatoid arthritis.  3. Smoker.   PLAN:  The patient's chest discomfort is certainly atypical for coronary  artery disease.  Its intermittent nature, lack of response to exercise,  and associated symptoms do not suggest a cardiac etiology.  Nonetheless,  with a chronic inflammatory disorder, continued smoking use, and  familial history of coronary artery disease she certainly is at risk.  An exercise tolerance test has been requested apparently by her  gastroenterologist, and we will make arrangements for her to have a  stress radionuclide study.  We considered  doing a routine exercise  stress test, but the patient has had chest pain on the treadmill.  We  will try to make that arrangement in the next few weeks.     Arturo Morton. Riley Kill, MD, [facility]  Electronically Signed    TDS/MedQ  DD: 07/08/2006  DT: 07/08/2006  Job #: [id number]

## 2010-10-23 NOTE — Assessment & Plan Note (Signed)
[facility]                            CARDIOLOGY OFFICE NOTE   NAME:[name], [name]                           MRN:          [id number]  DATE:08/23/2006                            DOB:          [birth year]    CHIEF COMPLAINT:  Shortness of breath.   HISTORY OF PRESENT ILLNESS:  The patient is in for followup.  We  reviewed in careful detail her nuclide imaging study from August 01, 2006.  She was exercised for 7-1/2 minutes.  There were no ST-T wave  changes, and myocardial perfusion study imaging was normal.  The  ejection fraction was 78%.  She has not had further chest pain.  Her  major complaint is that of shortness of breath.  I inquired about a  chest x-ray, and she apparently had been told at [facility]; she was called  and told there was a mass and that the mass was bigger.  They asked her  to get a TB test.  She says she has had this done.  She asked for  referral to pulmonary.  Her major complaint is that of shortness of  breath.  We have no data on her chest x-rays.  She did say she had a CT  scan done in [place].   PHYSICAL EXAMINATION:  VITAL SIGNS: Weight 111 pounds.  Blood pressure  128/81, pulse 83.  LUNGS:  The lung fields reveal decreased breath sounds bilaterally.  CARDIAC:  The heart sounds are slightly distant but without an obvious  murmur noted.  The PMI is nondisplaced.  There is no extremity edema.   At the present time, I am not sure as to the etiology of her symptoms or  findings.  She asked for referral to pulmonary medicine, and we will  make an appointment for her to see one of the pulmonary doctors.  With  regard to her x-ray findings, I have asked her to get her x-rays from  both [facility] and [place] so they can be seen at the office.  She does  continue to smoke, and we had an extensive discussion about this today.  She has rheumatoid arthritis and takes a number of medications, and she  has had difficulty with  discontinuation of smoking in the past.  I also  explained the mechanism of acute coronary syndrome to her and explained  to her that a myocardial perfusion imaging study does not exclude that.  We will make an appointment for her to see pulmonary medicine, and I  have asked her to get all of her x-rays to follow up with them.     Arturo Morton. Riley Kill, MD, [facility]  Electronically Signed    TDS/MedQ  DD: 08/23/2006  DT: 08/23/2006  Job #: [id number]   cc:   Leslye Peer, MD

## 2010-10-23 NOTE — Assessment & Plan Note (Signed)
[facility]                             PULMONARY OFFICE NOTE   NAME:[name]                           MRN:          [id number]  DATE:09/09/2006                            DOB:          1940-07-18    REFERRING PHYSICIAN:  Arturo Morton. Riley Kill, MD, [facility]   I met [name] today for evaluation of her abdominal chest x-ray.     She has a history of rheumatoid arthritis and had undergone a chest x-  ray at [facility] on July 29, 2006, and apparently the patient  was told that she had a mass on the chest x-ray, and as a result she had  a PPD placed, prior to being started on prednisone for her rheumatoid  arthritis.  Per the patient, the PPD was found to be negative, and she  was subsequently started on prednisone.  She continues to have symptoms  of dyspnea.  In addition to the concern about a possible lung mass,  pulmonary consultation was requested.  She says that she currently has  symptoms of a cough, with the production of clear to brownish sputum  mostly in the morning, on a daily basis.  She denies having any symptoms  of chest tightness or wheezing.  She has not had any recent fevers,  chills or sweats.  She say that she has been losing weight for  approximately the last two years, but her appetite is reasonably stable.  She does not have any chest pains, abdominal pain or nauseousness.  She  denies any skin rashes or leg swelling.  She had previously been on  methotrexate for her rheumatoid arthritis but is currently on Plaquenil  and prednisone.  She was previously seen by Dr. Deboraha Sprang in [facility] for  her pulmonary difficulties, but has not seen a pulmonologist for the  last several years.   PAST MEDICAL HISTORY:  Significant for reflux, rheumatoid arthritis,  hypothyroidism, anxiety, sciatica.   PAST SURGICAL HISTORY:  Significant for a cholecystectomy, tubal  ligation, tonsillectomy.   CURRENT MEDICATIONS:  1. Celebrex 200 mg  q.d.  2. Plaquenil 400 mg q.d.  3. Prednisone 1 mg q.d.  4. Cymbalta 30 mg q.d.  5. Synthroid 50 mcg q.d.  6. Protonix 80 mg q.d.  7. Trazodone.  8. Folic acid 1 mg q.d.  9. Flexeril 10 mg q.d.   ALLERGIES:  SHE HAS ALLERGIES TO SYMMETREL AND ETRAFON.   SOCIAL HISTORY:  She is single.  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  She currently smokes 3/4 packs of cigarettes per  day.  She has smoked for the last 30 years.  There is no significant  history of alcohol use.   FAMILY HISTORY:  Significant for her brother had heart disease and  prostate cancer.   REVIEW OF SYSTEMS:  She does complain of a feeling of gas pressure, as  well as occasional reflux.   PHYSICAL EXAMINATION:  VITAL SIGNS:  She is 5 feet 5 inches tall, 113  pounds, temperature 97.9.  Blood pressure is 120/80.  Heart rate is 81,  Oxygen saturation 96% on room air.  HEENT:  Pupils reactive.  There is no sinus tenderness, no nasal  discharge, no oral lesions, no lymphadenopathy.  HEART:  S1, S2, regular rate and rhythm.  CHEST:  There were decreased breath sounds, prolonged expiratory phase  but no wheezing.  ABDOMEN:  Thin, soft, nontender.  EXTREMITIES:  With no edema, cyanosis or clubbing.  NEUROLOGIC:  No focal deficits were appreciated.   Chest x-ray from July 29, 2006 was compared to x-ray from November 08, 2002, and showed bi-apical pleural thickening, with mild apical  fibrosis.  CT scan of the chest from July 27, 2006 was reviewed by  me and showed minimal apical fibrotic changes, no adenopathy, no focal  masses and no infiltrates.   IMPRESSION:  1. Abnormal chest x-ray.  This likely represents either a prior      infection or a inflammatory process.  It certainly does not appear      that she has an acute process.  I did not see any evidence for a      mass lesion, as was the impression of the patient.  Therefore I do      not feel that she would need to have any further  intervention such      as bronchoscopy or tissue sampling at this time.  2. Dyspnea with a history of tobacco abuse.  I am concerned that she      probably has underlying chronic obstructive pulmonary disease with      emphysema.  To further evaluate this, I will schedule her for a      pulmonary function test.  I have also given her a sample of      Spiriva, which she is to use one puff daily.  3. Tobacco abuse.  I have counseled her with regards to smoking      cessation.  She says she would like to try a nicotine patch at the      present time, although I discussed with her several other medical      therapy that she could try, if she is unsuccessful with the      nicotine patch.  4. Rheumatoid arthritis, with previous use of methotrexate, currently      on Plaquenil and prednisone.  She is due to have followup with Dr.      Close at [facility].  I will review her pulmonary function      tests to determine if she is having any involvement of her      rheumatoid arthritis, which could be attributing to her symptoms of      dyspnea.   I will follow up with her in approximately 4 to 6 weeks.     Coralyn Helling, MD  Electronically Signed    VS/MedQ  DD: 09/13/2006  DT: 09/13/2006  Job #: [id number]   cc:   Arturo Morton. Riley Kill, MD, [facility]  Aram Beecham, MD  Megan Close, MD

## 2010-10-30 ENCOUNTER — Ambulatory Visit: Payer: Medicare Other | Admitting: Podiatry

## 2011-02-24 ENCOUNTER — Emergency Department: Payer: Self-pay | Admitting: Emergency Medicine

## 2011-03-18 LAB — URINALYSIS, ROUTINE W REFLEX MICROSCOPIC
Bilirubin Urine: NEGATIVE
Ketones, ur: NEGATIVE
Nitrite: NEGATIVE
Protein, ur: NEGATIVE

## 2011-09-17 ENCOUNTER — Other Ambulatory Visit: Payer: Self-pay | Admitting: Obstetrics and Gynecology

## 2011-09-17 DIAGNOSIS — R928 Other abnormal and inconclusive findings on diagnostic imaging of breast: Secondary | ICD-10-CM

## 2011-09-21 ENCOUNTER — Ambulatory Visit
Admission: RE | Admit: 2011-09-21 | Discharge: 2011-09-21 | Disposition: A | Discharge status: Home / Self Care | Payer: Medicare Other | Source: Ambulatory Visit | Attending: Obstetrics and Gynecology | Admitting: Obstetrics and Gynecology

## 2011-09-21 DIAGNOSIS — R928 Other abnormal and inconclusive findings on diagnostic imaging of breast: Secondary | ICD-10-CM

## 2011-11-05 ENCOUNTER — Emergency Department (HOSPITAL_COMMUNITY): Payer: Medicare Other

## 2011-11-05 ENCOUNTER — Emergency Department (HOSPITAL_COMMUNITY)
Admission: EM | Admit: 2011-11-05 | Discharge: 2011-11-05 | Disposition: A | Discharge status: Home Health | Payer: Medicare Other | Attending: Emergency Medicine | Admitting: Emergency Medicine

## 2011-11-05 ENCOUNTER — Encounter (HOSPITAL_COMMUNITY): Payer: Self-pay | Admitting: *Deleted

## 2011-11-05 DIAGNOSIS — M069 Rheumatoid arthritis, unspecified: Secondary | ICD-10-CM | POA: Insufficient documentation

## 2011-11-05 DIAGNOSIS — K5900 Constipation, unspecified: Secondary | ICD-10-CM | POA: Insufficient documentation

## 2011-11-05 DIAGNOSIS — R110 Nausea: Secondary | ICD-10-CM | POA: Insufficient documentation

## 2011-11-05 DIAGNOSIS — F17200 Nicotine dependence, unspecified, uncomplicated: Secondary | ICD-10-CM | POA: Insufficient documentation

## 2011-11-05 DIAGNOSIS — R109 Unspecified abdominal pain: Secondary | ICD-10-CM | POA: Insufficient documentation

## 2011-11-05 HISTORY — DX: Rheumatoid arthritis, unspecified: M06.9

## 2011-11-05 HISTORY — DX: Other specified disorders of thyroid: E07.89

## 2011-11-05 LAB — POCT I-STAT, CHEM 8
Calcium, Ion: 1.18 mmol/L (ref 1.12–1.32)
Glucose, Bld: 90 mg/dL (ref 70–99)
HCT: 37 % (ref 36.0–46.0)
TCO2: 26 mmol/L (ref 0–100)

## 2011-11-05 MED ORDER — PEG 3350-KCL-NA BICARB-NACL 420 G PO SOLR: 4000.0000 mL | Freq: Once | ORAL | Status: AC

## 2011-11-05 NOTE — ED Notes (Signed)
Pt states she has hx of "sluggish colon". Sees gastrologist at home, couldn't get an apt so came to ED. C/o pain in colon and abd. States stomach gets "bloated" easily. Pt used enema last night, states only "small amount of water went in" and felt nauseous after.

## 2011-11-05 NOTE — Discharge Instructions (Signed)
You do not  Have an obstruction.  Use golytely to promote a bowel movement.  Follow up with your doctor for reevaluation. Return for worse symptoms.

## 2011-11-05 NOTE — ED Provider Notes (Signed)
History     CSN: [id number]  Arrival date & time 11/05/11  1006   First MD Initiated Contact with Patient 11/05/11 1056      Chief Complaint  Patient presents with  . Constipation    (Consider location/radiation/quality/duration/timing/severity/associated sxs/prior treatment) Patient is a 71 y.o. female presenting with constipation. The history is provided by the patient.  Constipation  Associated symptoms include abdominal pain and nausea. Pertinent negatives include no fever, no diarrhea and no vomiting.  the pt is a 10 y female with hx of chronic constipation. She c/o no bm for several days despite using laxatives and enemas.   She feels nauseated but no vomiting. No hx of abd surgery.   She is passing gas.    Past Medical History  Diagnosis Date  . Arthritis, rheumatoid   . Thyroid binding globulin deficiency     Past Surgical History  Procedure Date  . Cholecystectomy   . Tonsillectomy     No family history on file.  History  Substance Use Topics  . Smoking status: Current Everyday Smoker -- 1.0 packs/day    Types: Cigarettes  . Smokeless tobacco: Not on file  . Alcohol Use: No    OB History    Grav Para Term Preterm Abortions TAB SAB Ect Mult Living                  Review of Systems  Constitutional: Negative for fever and chills.  Gastrointestinal: Positive for nausea, abdominal pain and constipation. Negative for vomiting and diarrhea.  All other systems reviewed and are negative.    Allergies  Hydrocodone  Home Medications   Current Outpatient Rx  Name Route Sig Dispense Refill  . CYANOCOBALAMIN 1000 MCG/ML IJ SOLN Intramuscular Inject 1,000 mcg into the muscle once. Pt gets every 6 weeks    . DEXLANSOPRAZOLE 60 MG PO CPDR Oral Take 60 mg by mouth daily.    Marland Kitchen FLUTICASONE PROPIONATE 50 MCG/ACT NA SUSP Nasal Place 2 sprays into the nose daily.    Marland Kitchen HYDROXYCHLOROQUINE SULFATE 200 MG PO TABS Oral Take 400 mg by mouth daily.    Marland Kitchen LEVOTHYROXINE  SODIUM 88 MCG PO TABS Oral Take 88 mcg by mouth daily.    Marland Kitchen METHOTREXATE 2.5 MG PO TABS Oral Take 20 mg by mouth once a week. Caution:Chemotherapy. Protect from light. ** pt takes 8 tabs of 2.5mg  tablet to equal total dose of 20mg ** pt takes on Sunday    . MULTI-VITAMIN/MINERALS PO TABS Oral Take 1 tablet by mouth daily.    . SERTRALINE HCL 50 MG PO TABS Oral Take 50 mg by mouth daily.    . TRAZODONE HCL 50 MG PO TABS Oral Take 50 mg by mouth at bedtime.    Marland Kitchen VITAMIN C 500 MG PO TABS Oral Take 500 mg by mouth daily.    Marland Kitchen VITAMIN D (CHOLECALCIFEROL) PO Oral Take 1 tablet by mouth daily.      BP 102/60  Pulse 86  Temp(Src) 98.6 F (37 C) (Oral)  Resp 18  SpO2 100%  Physical Exam  Nursing note and vitals reviewed. Constitutional: She is oriented to person, place, and time. She appears well-developed and well-nourished. No distress.  HENT:  Head: Normocephalic and atraumatic.  Eyes: Conjunctivae are normal.  Neck: Normal range of motion.  Cardiovascular: Normal rate.   No murmur heard. Pulmonary/Chest: Effort normal. No respiratory distress.  Abdominal: Soft. She exhibits no distension. There is no tenderness.  Hypoactive bowel sounds  Genitourinary:       Rectal exam performed with rn chaperone No stool in rectal vault  Musculoskeletal: Normal range of motion.  Neurological: She is alert and oriented to person, place, and time.  Skin: Skin is warm and dry.  Psychiatric: She has a normal mood and affect. Thought content normal.    ED Course  Procedures (including critical care time)  Labs Reviewed - No data to display No results found.   No diagnosis found.    MDM  constipation        Cheri Guppy, MD 11/05/11 1335

## 2011-11-05 NOTE — ED Notes (Signed)
Pt c/o constipation that began on 10/11/11. States she saw PCP on 10/14/11 who recommended Milk of Magnesia. Pt states she used enema last night, colon only "accept small amount of water", followed by "heavy nausea".

## 2012-04-07 ENCOUNTER — Ambulatory Visit: Payer: Self-pay

## 2012-05-01 ENCOUNTER — Ambulatory Visit: Payer: Self-pay | Admitting: Otolaryngology

## 2012-05-11 ENCOUNTER — Ambulatory Visit: Payer: Self-pay | Admitting: Otolaryngology

## 2013-02-03 ENCOUNTER — Ambulatory Visit: Payer: Self-pay | Admitting: Internal Medicine

## 2013-09-17 ENCOUNTER — Ambulatory Visit: Payer: Self-pay | Admitting: Otolaryngology

## 2013-09-24 ENCOUNTER — Telehealth: Payer: Self-pay | Admitting: Internal Medicine

## 2013-09-24 NOTE — Telephone Encounter (Signed)
I have contacted the patient and she does not want to make an appt.  She wants an appt with a Dr. Justin Mend.  I have notified her that there is no Dr. Justin Mend in our practice.  She declines an appt.  I have notified Dr. Earlie Server office

## 2013-10-02 ENCOUNTER — Ambulatory Visit: Payer: Medicare Other | Admitting: Internal Medicine

## 2013-10-10 DIAGNOSIS — D510 Vitamin B12 deficiency anemia due to intrinsic factor deficiency: Secondary | ICD-10-CM | POA: Insufficient documentation

## 2013-10-10 DIAGNOSIS — M810 Age-related osteoporosis without current pathological fracture: Secondary | ICD-10-CM | POA: Insufficient documentation

## 2014-01-10 ENCOUNTER — Ambulatory Visit: Payer: Self-pay | Admitting: Internal Medicine

## 2014-04-05 ENCOUNTER — Ambulatory Visit: Payer: Self-pay | Admitting: Internal Medicine

## 2014-04-18 ENCOUNTER — Other Ambulatory Visit: Payer: Self-pay | Admitting: Gastroenterology

## 2014-04-18 DIAGNOSIS — R634 Abnormal weight loss: Secondary | ICD-10-CM

## 2014-04-18 DIAGNOSIS — R1012 Left upper quadrant pain: Secondary | ICD-10-CM

## 2014-04-23 ENCOUNTER — Other Ambulatory Visit: Payer: Self-pay | Admitting: Gastroenterology

## 2014-04-23 DIAGNOSIS — K559 Vascular disorder of intestine, unspecified: Secondary | ICD-10-CM

## 2014-04-23 DIAGNOSIS — R1012 Left upper quadrant pain: Secondary | ICD-10-CM

## 2014-04-24 ENCOUNTER — Ambulatory Visit
Admission: RE | Admit: 2014-04-24 | Discharge: 2014-04-24 | Disposition: A | Discharge status: Home / Self Care | Payer: Medicare Other | Source: Ambulatory Visit | Attending: Gastroenterology | Admitting: Gastroenterology

## 2014-04-24 DIAGNOSIS — R1012 Left upper quadrant pain: Secondary | ICD-10-CM

## 2014-04-24 DIAGNOSIS — K559 Vascular disorder of intestine, unspecified: Secondary | ICD-10-CM

## 2014-04-24 MED ORDER — IOHEXOL 350 MG/ML SOLN
80.0000 mL | Freq: Once | INTRAVENOUS | Status: AC | PRN
  Administered 2014-04-24: 80 mL via INTRAVENOUS

## 2014-09-04 ENCOUNTER — Telehealth: Payer: Self-pay

## 2014-09-04 NOTE — Telephone Encounter (Signed)
09/04/14 3 Disc Received from [facility] and filed on shelf.Britt Bottom

## 2014-09-24 NOTE — Op Note (Signed)
PATIENT NAME:  [name], [name] MR#:  [id number] DATE OF BIRTH:  08/25/1940  DATE OF PROCEDURE:  05/11/2012  PREOPERATIVE DIAGNOSIS: Left pharyngoepiglottic fold lesion.  POSTOPERATIVE DIAGNOSIS: Left pharyngoepiglottic fold lesion.  PROCEDURE: Microscopic direct laryngoscopy with microscopic cold steel excision of left pharyngoepiglottic fold lesion.   SURGEON: Janalee Dane, MD  DESCRIPTION OF PROCEDURE: The patient was placed in the supine position on the Operating Room table. After the patient had been turned 90 degrees counterclockwise, placed in the slight neck extended position, the Dedo laryngoscope was used to perform laryngoscopy. The laryngoscope was suspended using the suspension system after careful examination of the larynx, pharynx and hypopharynx had been carried out with the Dedo. The only lesion that was found was in the left pharyngoepiglottic fold. Using the microscope with the 400 mm lens and up-biting cup forceps with microlaryngoscopy scissors the lesion was meticulously dissected from the left pharyngoepiglottic fold. A local soaked Neuro pledget was placed, left in place for approximately five minutes then replaced with another Neuro Patty, left in place for another five minutes. The bleeding had stopped. Another careful examination revealed no other lesions. The patient was then returned to anesthesia, allowed to emerge from anesthesia in the Operating Room, taken to the recovery room in stable condition. There were no complications. Estimated blood loss less than 5 mL.   ____________________________ J. Nadeen Landau, MD j[facility]:cms D: 05/11/2012 09:47:06 ET T: 05/11/2012 10:40:12 ET JOB#: [id number]  cc: Janalee Dane, MD, <Dictator> Nicholos Johns MD ELECTRONICALLY SIGNED 05/26/2012 11:38

## 2014-12-28 ENCOUNTER — Other Ambulatory Visit: Payer: Self-pay

## 2014-12-28 ENCOUNTER — Emergency Department: Payer: Medicare Other

## 2014-12-28 ENCOUNTER — Emergency Department
Admission: EM | Admit: 2014-12-28 | Discharge: 2014-12-28 | Disposition: A | Discharge status: Home / Self Care | Payer: Medicare Other | Attending: Emergency Medicine | Admitting: Emergency Medicine

## 2014-12-28 ENCOUNTER — Encounter: Payer: Self-pay | Admitting: Emergency Medicine

## 2014-12-28 DIAGNOSIS — Z79899 Other long term (current) drug therapy: Secondary | ICD-10-CM | POA: Insufficient documentation

## 2014-12-28 DIAGNOSIS — L03115 Cellulitis of right lower limb: Secondary | ICD-10-CM

## 2014-12-28 DIAGNOSIS — R1110 Vomiting, unspecified: Secondary | ICD-10-CM | POA: Diagnosis not present

## 2014-12-28 DIAGNOSIS — Z720 Tobacco use: Secondary | ICD-10-CM | POA: Diagnosis not present

## 2014-12-28 DIAGNOSIS — E871 Hypo-osmolality and hyponatremia: Secondary | ICD-10-CM | POA: Insufficient documentation

## 2014-12-28 DIAGNOSIS — K5901 Slow transit constipation: Secondary | ICD-10-CM | POA: Insufficient documentation

## 2014-12-28 DIAGNOSIS — Z7951 Long term (current) use of inhaled steroids: Secondary | ICD-10-CM | POA: Diagnosis not present

## 2014-12-28 DIAGNOSIS — K5900 Constipation, unspecified: Secondary | ICD-10-CM | POA: Diagnosis present

## 2014-12-28 LAB — URINALYSIS COMPLETE WITH MICROSCOPIC (ARMC ONLY)
Bacteria, UA: NONE SEEN
Bilirubin Urine: NEGATIVE
GLUCOSE, UA: NEGATIVE mg/dL
HGB URINE DIPSTICK: NEGATIVE
Ketones, ur: NEGATIVE mg/dL
LEUKOCYTES UA: NEGATIVE
NITRITE: NEGATIVE
PH: 6 (ref 5.0–8.0)
PROTEIN: NEGATIVE mg/dL
RBC / HPF: NONE SEEN RBC/hpf (ref 0–5)
Specific Gravity, Urine: 1.003 — ABNORMAL LOW (ref 1.005–1.030)

## 2014-12-28 LAB — COMPREHENSIVE METABOLIC PANEL
ALT: 21 U/L (ref 14–54)
ANION GAP: 10 (ref 5–15)
AST: 29 U/L (ref 15–41)
Albumin: 4.3 g/dL (ref 3.5–5.0)
Alkaline Phosphatase: 76 U/L (ref 38–126)
BUN: 9 mg/dL (ref 6–20)
CALCIUM: 8.6 mg/dL — AB (ref 8.9–10.3)
CO2: 27 mmol/L (ref 22–32)
CREATININE: 0.93 mg/dL (ref 0.44–1.00)
Chloride: 88 mmol/L — ABNORMAL LOW (ref 101–111)
GFR, EST NON AFRICAN AMERICAN: 60 mL/min — AB (ref >60)
GLUCOSE: 144 mg/dL — AB (ref 65–99)
Potassium: 2.8 mmol/L — CL (ref 3.5–5.1)
SODIUM: 125 mmol/L — AB (ref 135–145)
TOTAL PROTEIN: 6.9 g/dL (ref 6.5–8.1)
Total Bilirubin: 1.5 mg/dL — ABNORMAL HIGH (ref 0.3–1.2)

## 2014-12-28 LAB — CBC
HEMATOCRIT: 38.1 % (ref 35.0–47.0)
HEMOGLOBIN: 13.3 g/dL (ref 12.0–16.0)
MCH: 31.3 pg (ref 26.0–34.0)
MCHC: 34.9 g/dL (ref 32.0–36.0)
MCV: 89.7 fL (ref 80.0–100.0)
Platelets: 299 10*3/uL (ref 150–440)
RBC: 4.24 MIL/uL (ref 3.80–5.20)
RDW: 14.5 % (ref 11.5–14.5)
WBC: 11.7 10*3/uL — ABNORMAL HIGH (ref 3.6–11.0)

## 2014-12-28 LAB — SODIUM: Sodium: 127 mmol/L — ABNORMAL LOW (ref 135–145)

## 2014-12-28 LAB — LIPASE, BLOOD: LIPASE: 15 U/L — AB (ref 22–51)

## 2014-12-28 MED ORDER — POTASSIUM CHLORIDE CRYS ER 20 MEQ PO TBCR
40.0000 meq | EXTENDED_RELEASE_TABLET | Freq: Once | ORAL | Status: AC
  Administered 2014-12-28: 40 meq via ORAL
  Filled 2014-12-28: qty 2

## 2014-12-28 MED ORDER — SODIUM CHLORIDE 0.9 % IV BOLUS (SEPSIS)
500.0000 mL | Freq: Once | INTRAVENOUS | Status: AC
  Administered 2014-12-28: 500 mL via INTRAVENOUS

## 2014-12-28 MED ORDER — SODIUM CHLORIDE 0.9 % IV BOLUS (SEPSIS)
250.0000 mL | Freq: Once | INTRAVENOUS | Status: AC
  Administered 2014-12-28: 250 mL via INTRAVENOUS

## 2014-12-28 MED ORDER — DOXYCYCLINE HYCLATE 100 MG PO CAPS
100.0000 mg | ORAL_CAPSULE | Freq: Two times a day (BID) | ORAL | Status: DC
Start: 2014-12-28 — End: 2015-02-18

## 2014-12-28 MED ORDER — POTASSIUM CHLORIDE CRYS ER 20 MEQ PO TBCR: 20.0000 meq | EXTENDED_RELEASE_TABLET | Freq: Every day | ORAL | Status: DC

## 2014-12-28 MED ORDER — MAGNESIUM CITRATE PO SOLN: 1.0000 | Freq: Once | ORAL | Status: DC

## 2014-12-28 MED ORDER — DOXYCYCLINE HYCLATE 100 MG PO TABS
100.0000 mg | ORAL_TABLET | Freq: Once | ORAL | Status: AC
  Administered 2014-12-28: 100 mg via ORAL
  Filled 2014-12-28: qty 1

## 2014-12-28 NOTE — ED Provider Notes (Signed)
[facility] Emergency Department Provider Note  ____________________________________________  Time seen: Approximately 3:06 PM  I have reviewed the triage vital signs and the nursing notes.   HISTORY  Chief Complaint Constipation and Foot Pain    HPI [name] is a 74 y.o. female states he has a history of slow bowels she has trouble with for years. She states that she has been unable to have a bowel movement for about 1 week, but is passing gas. She states she has some crampy pain intermittent abdomen from time to time. Presently mild. She's been taking oral laxity is and tried an enema at home with no relief. She states this happened many a time and this is not uncommon for her. No new or unusual symptoms but does think she needs additional enema. In addition she has not been able to have as much oral intake recently because of her constipation though she is not vomiting. No pain or in her around the rectum. Does not feel impacted.  Patient also relates that for about 24-48 hours for right foot has been slightly red and swollen over the right ankle and across the bridge of the foot. This happened about 6 months ago and was treated with cortisone and she states is improved. She does not endorse any fevers, she is able to walk on the foot but is slightly sore over the lateral ankle. He denies history of gout.  She does have a history of rheumatoid arthritis.  Past Medical History  Diagnosis Date  . Arthritis, rheumatoid   . Thyroid binding globulin deficiency     Patient Active Problem List   Diagnosis Date Noted  . HYPOTHYROIDISM 08/27/2009  . TOBACCO ABUSE 08/27/2009  . RENAL ARTERY STENOSIS 08/27/2009  . COPD 08/27/2009  . GERD 08/27/2009  . ARTHRITIS, RHEUMATOID, HX OF 08/27/2009  . COLONIC POLYPS 08/03/2007  . ANXIETY 08/03/2007  . CHRONIC PAIN SYNDROME 08/03/2007  . HEMORRHOIDS 08/03/2007  . GASTRITIS 08/03/2007  . DIVERTICULOSIS, COLON  08/03/2007  . FIBROCYSTIC BREAST DISEASE 08/03/2007  . OSTEOPENIA 08/03/2007  . CHEST PAIN, ATYPICAL 08/03/2007  . PERSONAL HISTORY OF COLONIC POLYPS 08/03/2007  . CONSTIPATION, CHRONIC, HX OF 08/03/2007    Past Surgical History  Procedure Laterality Date  . Cholecystectomy    . Tonsillectomy      Current Outpatient Rx  Name  Route  Sig  Dispense  Refill  . cyanocobalamin (,VITAMIN B-12,) 1000 MCG/ML injection   Intramuscular   Inject 1,000 mcg into the muscle once. Pt gets every 6 weeks         . dexlansoprazole (DEXILANT) 60 MG capsule   Oral   Take 60 mg by mouth daily.         . fluticasone (FLONASE) 50 MCG/ACT nasal spray   Nasal   Place 2 sprays into the nose daily.         . hydroxychloroquine (PLAQUENIL) 200 MG tablet   Oral   Take 400 mg by mouth daily.         Marland Kitchen levothyroxine (SYNTHROID, LEVOTHROID) 88 MCG tablet   Oral   Take 88 mcg by mouth daily.         . methotrexate (RHEUMATREX) 2.5 MG tablet   Oral   Take 20 mg by mouth once a week. Caution:Chemotherapy. Protect from light. ** pt takes 8 tabs of 2.5mg  tablet to equal total dose of 20mg ** pt takes on Sunday         . Multiple  Vitamins-Minerals (MULTIVITAMIN WITH MINERALS) tablet   Oral   Take 1 tablet by mouth daily.         . sertraline (ZOLOFT) 50 MG tablet   Oral   Take 50 mg by mouth daily.         . traZODone (DESYREL) 50 MG tablet   Oral   Take 50 mg by mouth at bedtime.         . vitamin C (ASCORBIC ACID) 500 MG tablet   Oral   Take 500 mg by mouth daily.         Marland Kitchen VITAMIN D, CHOLECALCIFEROL, PO   Oral   Take 1 tablet by mouth daily.           Allergies Hydrocodone  No family history on file.  Social History History  Substance Use Topics  . Smoking status: Current Every Day Smoker -- 1.00 packs/day    Types: Cigarettes  . Smokeless tobacco: Not on file  . Alcohol Use: No    Review of Systems Constitutional: No fever/chills Eyes: No visual  changes. ENT: No sore throat. Cardiovascular: Denies chest pain. Respiratory: Denies shortness of breath. Gastrointestinal: No nausea, no vomiting.  No diarrhea.  Genitourinary: Negative for dysuria. Musculoskeletal: Negative for back pain. Skin: Negative for rash. Neurological: Negative for headaches, focal weakness or numbness.  10-point ROS otherwise negative.  ____________________________________________   PHYSICAL EXAM:  VITAL SIGNS: ED Triage Vitals  Enc Vitals Group     BP 12/28/14 1330 83/62 mmHg     Pulse Rate 12/28/14 1330 98     Resp 12/28/14 1330 20     Temp 12/28/14 1330 98.7 F (37.1 C)     Temp Source 12/28/14 1330 Oral     SpO2 12/28/14 1330 98 %     Weight 12/28/14 1330 94 lb (42.638 kg)     Height 12/28/14 1330 5\' 5"  (1.651 m)     Head Cir --      Peak Flow --      Pain Score 12/28/14 1336 7     Pain Loc --      Pain Edu? --      Excl. in [place]? --    Patient's blood pressure 989 systolic at the time I examined 3:15 PM.  Constitutional: Alert and oriented. Well appearing and in no acute distress. Eyes: Conjunctivae are normal. PERRL. EOMI. Head: Atraumatic. Nose: No congestion/rhinnorhea. Mouth/Throat: Mucous membranes are moist.  Oropharynx non-erythematous. Neck: No stridor.   Cardiovascular: Normal rate, regular rhythm. Grossly normal heart sounds.  Good peripheral circulation. Respiratory: Normal respiratory effort.  No retractions. Lungs CTAB. Gastrointestinal: Soft and nontender except for some very mild aching periumbilical. No distention. No abdominal bruits. No CVA tenderness. Normal bowel sounds. Musculoskeletal: No lower extremity tenderness nor edema.  No joint effusions. Dorsalis pedis normal both feet. The right foot demonstrates an area about the size of the palm with some erythema and slight induration and tenderness over the bridge of the right foot and lateral ankle. She has good range of motion of the ankle without significant pain in  the joint and there is no joint effusion. Neurologic:  Normal speech and language. No gross focal neurologic deficits are appreciated. No gait instability. Skin:  Skin is warm, dry and intact. No rash noted. Psychiatric: Mood and affect are normal. Speech and behavior are normal.  ____________________________________________   LABS (all labs ordered are listed, but only abnormal results are displayed)  Labs Reviewed  LIPASE, BLOOD -  Abnormal; Notable for the following:    Lipase 15 (*)    All other components within normal limits  COMPREHENSIVE METABOLIC PANEL - Abnormal; Notable for the following:    Sodium 125 (*)    Potassium 2.8 (*)    Chloride 88 (*)    Glucose, Bld 144 (*)    Calcium 8.6 (*)    Total Bilirubin 1.5 (*)    GFR calc non Af Amer 60 (*)    All other components within normal limits  CBC - Abnormal; Notable for the following:    WBC 11.7 (*)    All other components within normal limits  URINALYSIS COMPLETEWITH MICROSCOPIC ([facility] ONLY) - Abnormal; Notable for the following:    Color, Urine YELLOW (*)    APPearance CLEAR (*)    Specific Gravity, Urine 1.003 (*)    Squamous Epithelial / LPF 0-5 (*)    All other components within normal limits  SODIUM - Abnormal; Notable for the following:    Sodium 127 (*)    All other components within normal limits   ____________________________________________  EKG  ED ECG REPORT I, QUALE, MARK, the attending physician, personally viewed and interpreted this ECG.  Date: 12/28/2014 EKG Time: 3:05 PM Rate: 86 Rhythm: normal sinus rhythm QRS Axis: Incomplete right bundle Intervals: normal ST/T Wave abnormalities: normal Conduction Disutrbances: Sinus rhythm occasional PVC, incomplete right bundle Narrative Interpretation: Sinus rhythm occasional PVC with an incomplete right bundle without ischemic change noted.  ____________________________________________  RADIOLOGY  DG Foot Complete Right (Final result) Result  time: 12/28/14 14:37:48   Final result by Rad Results In Interface (12/28/14 14:37:48)   Narrative:   CLINICAL DATA: Pain at the base of the fifth metatarsal with tenderness and swelling. Initial encounter.  EXAM: RIGHT FOOT COMPLETE - 3+ VIEW  COMPARISON: None.  FINDINGS: No acute bony or joint abnormality is identified. There is degenerative change about the fifth MTP joint. Subchondral cyst formation and mild deformity of fifth metatarsal head may be due to old trauma or avascular necrosis. Soft tissues are unremarkable.  IMPRESSION: No acute abnormality.  Fifth MTP degenerative change with possible remote trauma or avascular necrosis of the fifth metatarsal head.   DG Abd 2 Views (Final result) Result time: 12/28/14 15:57:12   Final result by Rad Results In Interface (12/28/14 15:57:12)   Narrative:   CLINICAL DATA: Abdominal pain and no bowel movement for 1 week  EXAM: ABDOMEN - 2 VIEW  COMPARISON: None.  FINDINGS: Scattered large and small bowel gas is noted. No free air is seen. Increased density is noted along the course of the duodenum consistent with ingested material. Fecal material is seen within the colon although no constipation or obstructive changes are noted. No acute bony abnormality is seen.  IMPRESSION: No acute abnormality noted.   Electronically Signed By: Inez Catalina M.D. On: 12/28/2014 15:57        ____________________________________________   PROCEDURES  Procedure(s) performed: None  Critical Care performed: No  ____________________________________________   INITIAL IMPRESSION / ASSESSMENT AND PLAN / ED COURSE  Pertinent labs & imaging results that were available during my care of the patient were reviewed by me and considered in my medical decision making (see chart for details).  Patient presents with 2 concerns that seem to be unrelated. She has abdominal pain and constipation which sounds to be very  chronic in nature and unchanged from previous episodes. We will obtain a abdominal x-ray to evaluate for evidence of obstruction though her history  does not suggest it as she is not vomiting and she is passing flatus with a very benign exam the abdomen at this time. We'll give a soapsuds enema as well.  In addition, patient has some tenderness and swelling over the right foot and ankle. impression that with a slightly elevated white count this may represent cellulitis, and less likely to be a rheumatoid type flare. We will treat her with doxycycline for this. There is nothing to suggest acute septic joint at this time based on exam.  Patient also has mild hypotension with blood [id number] - 166 systolic, initially was in the 80s at triage however I question the accuracy of this single read. We will hydrate her gently with 500 mL of normal saline I suspect she is slightly hyponatremic and hypokalemic from not eating well and some mild dehydration, we will recheck her sodium thereafter. I will also replete her potassium.  ----------------------------------------- 4:13 PM on 12/28/2014 -----------------------------------------  Patient's blood pressure is improved to 108/52. Heart rate 80. She is currently on the bedpan and states that initially started as liquid stool but is now having good bowel movement. Appears that she is improved. Her x-ray demonstrates no acute abnormality.  We will recheck sodium now having hydrated.   ----------------------------------------- 5:20 PM on 12/28/2014 -----------------------------------------  Patient sodium level has improved slowly with hydration, I do believe that she was slightly hypovolemic with some mild hypotension and mild hyponatremia associated. This appears to be correcting. Her current blood pressure is 130/83 with a heart rate of 82 she denies pain and feels well. She tolerated by mouth here. We'll discharge her to home to follow-up with Dr. Sabra Heck  early this week. We'll place her on doxycycline for treatment of what appears to be a mild right lower leg cellulitis and also give her referral to orthopedics for evaluation.  I discussed return precautions, hydration, and treatment recommendations for her cellulitis with both the patient and her brother who are very accommodating and pleasant.  ____________________________________________   FINAL CLINICAL IMPRESSION(S) / ED DIAGNOSES  Final diagnoses:  Vomiting  Cellulitis of right ankle  Slow transit constipation  Dehydration with hyponatremia      Delman Kitten, MD 12/28/14 [phone]

## 2014-12-28 NOTE — ED Notes (Signed)
States has abd pain and no BM x 1 week, also concerned re right foot noted swollen outer aspect, painful.

## 2014-12-28 NOTE — ED Notes (Signed)
Pt arrives with complaints of right foot pain and redness and tenderness, papable dorsal pedial pulses +2, pt states the sam thing happened 6 months ago and she was given a cortisone shot, pt denies any injury to site, pt also complains of lower abd pain, pt states she has not had a BM in 1 week, pt states hx of sluggish colon and has been doing enemas with no success, pt awake and alert during assessment in no distress, husband at bedside

## 2014-12-28 NOTE — ED Notes (Signed)
Pt had large BM.  

## 2014-12-28 NOTE — Discharge Instructions (Signed)
Cellulitis  Please follow-up with Dr. Emily Filbert early this week for reevaluation of both her cellulitis as well as your low sodium and mild dehydration.  Return to the emergency room if you feel weak or dizzy, have confusion vomiting or nausea, developed severe abdominal pain, notice the  redness is worsening around the ankle you are unable to walk on the ankle or other new concerns arise.  You may wish to follow up with Dr. Earnestine Leys of orthopedics next week as well if you're ankle pain and redness is not improving.   Cellulitis is an infection of the skin and the tissue beneath it. The infected area is usually red and tender. Cellulitis occurs most often in the arms and lower legs.  CAUSES  Cellulitis is caused by bacteria that enter the skin through cracks or cuts in the skin. The most common types of bacteria that cause cellulitis are staphylococci and streptococci. SIGNS AND SYMPTOMS   Redness and warmth.  Swelling.  Tenderness or pain.  Fever. DIAGNOSIS  Your health care provider can usually determine what is wrong based on a physical exam. Blood tests may also be done. TREATMENT  Treatment usually involves taking an antibiotic medicine. HOME CARE INSTRUCTIONS   Take your antibiotic medicine as directed by your health care provider. Finish the antibiotic even if you start to feel better.  Keep the infected arm or leg elevated to reduce swelling.  Apply a warm cloth to the affected area up to 4 times per day to relieve pain.  Take medicines only as directed by your health care provider.  Keep all follow-up visits as directed by your health care provider. SEEK MEDICAL CARE IF:   You notice red streaks coming from the infected area.  Your red area gets larger or turns dark in color.  Your bone or joint underneath the infected area becomes painful after the skin has healed.  Your infection returns in the same area or another area.  You notice a swollen bump in  the infected area.  You develop new symptoms.  You have a fever. SEEK IMMEDIATE MEDICAL CARE IF:   You feel very sleepy.  You develop vomiting or diarrhea.  You have a general ill feeling (malaise) with muscle aches and pains. MAKE SURE YOU:   Understand these instructions.  Will watch your condition.  Will get help right away if you are not doing well or get worse. Document Released: 03/03/2005 Document Revised: 10/08/2013 Document Reviewed: 08/09/2011 [facility] Patient Information 2015 [place], [place]. This information is not intended to replace advice given to you by your health care provider. Make sure you discuss any questions you have with your health care provider.

## 2014-12-28 NOTE — ED Notes (Signed)
Critical lab Potassium, 2.8  Archie Balboa, MD informed new orders for a EKG. Pt in xray at this time.

## 2014-12-28 NOTE — ED Notes (Signed)
Pt unable to urinate and bp 83/62. Pt states feels fine, but has not been eating or drinking well since her foot began hurting. States had blood work this past week and they only found her sodium to be low. Has had this foot pain in past and they gave her a cortisone shot for it, but she doesn't remember the diagnosis

## 2014-12-30 ENCOUNTER — Emergency Department
Admission: EM | Admit: 2014-12-30 | Discharge: 2014-12-30 | Disposition: A | Discharge status: Home / Self Care | Payer: Medicare Other | Attending: Emergency Medicine | Admitting: Emergency Medicine

## 2014-12-30 ENCOUNTER — Other Ambulatory Visit: Payer: Self-pay

## 2014-12-30 ENCOUNTER — Emergency Department: Payer: Medicare Other

## 2014-12-30 ENCOUNTER — Encounter: Payer: Self-pay | Admitting: Emergency Medicine

## 2014-12-30 DIAGNOSIS — R1084 Generalized abdominal pain: Secondary | ICD-10-CM | POA: Diagnosis not present

## 2014-12-30 DIAGNOSIS — M7981 Nontraumatic hematoma of soft tissue: Secondary | ICD-10-CM | POA: Diagnosis not present

## 2014-12-30 DIAGNOSIS — R109 Unspecified abdominal pain: Secondary | ICD-10-CM | POA: Diagnosis present

## 2014-12-30 DIAGNOSIS — Z720 Tobacco use: Secondary | ICD-10-CM | POA: Insufficient documentation

## 2014-12-30 DIAGNOSIS — K5900 Constipation, unspecified: Secondary | ICD-10-CM | POA: Diagnosis not present

## 2014-12-30 LAB — COMPREHENSIVE METABOLIC PANEL
ALT: 17 U/L (ref 14–54)
AST: 23 U/L (ref 15–41)
Albumin: 3.7 g/dL (ref 3.5–5.0)
Alkaline Phosphatase: 62 U/L (ref 38–126)
Anion gap: 8 (ref 5–15)
BILIRUBIN TOTAL: 0.8 mg/dL (ref 0.3–1.2)
BUN: 6 mg/dL (ref 6–20)
CO2: 27 mmol/L (ref 22–32)
Calcium: 8.6 mg/dL — ABNORMAL LOW (ref 8.9–10.3)
Chloride: 93 mmol/L — ABNORMAL LOW (ref 101–111)
Creatinine, Ser: 0.76 mg/dL (ref 0.44–1.00)
GFR calc Af Amer: >60 mL/min (ref >60)
GFR calc non Af Amer: >60 mL/min (ref >60)
Glucose, Bld: 95 mg/dL (ref 65–99)
Potassium: 3.5 mmol/L (ref 3.5–5.1)
Sodium: 128 mmol/L — ABNORMAL LOW (ref 135–145)
Total Protein: 6.3 g/dL — ABNORMAL LOW (ref 6.5–8.1)

## 2014-12-30 LAB — CBC
HCT: 33.5 % — ABNORMAL LOW (ref 35.0–47.0)
HEMOGLOBIN: 11.9 g/dL — AB (ref 12.0–16.0)
MCH: 32.1 pg (ref 26.0–34.0)
MCHC: 35.4 g/dL (ref 32.0–36.0)
MCV: 90.7 fL (ref 80.0–100.0)
Platelets: 257 10*3/uL (ref 150–440)
RBC: 3.7 MIL/uL — ABNORMAL LOW (ref 3.80–5.20)
RDW: 14.4 % (ref 11.5–14.5)
WBC: 6.1 10*3/uL (ref 3.6–11.0)

## 2014-12-30 LAB — LIPASE, BLOOD: LIPASE: 21 U/L — AB (ref 22–51)

## 2014-12-30 MED ORDER — ONDANSETRON HCL 4 MG/2ML IJ SOLN
INTRAMUSCULAR | Status: AC
  Administered 2014-12-30: 4 mg via INTRAVENOUS
  Filled 2014-12-30: qty 2

## 2014-12-30 MED ORDER — ONDANSETRON HCL 4 MG/2ML IJ SOLN
4.0000 mg | Freq: Once | INTRAMUSCULAR | Status: AC
  Administered 2014-12-30: 4 mg via INTRAVENOUS

## 2014-12-30 MED ORDER — IOHEXOL 240 MG/ML SOLN
25.0000 mL | Freq: Once | INTRAMUSCULAR | Status: AC | PRN
  Administered 2014-12-30: 25 mL via ORAL

## 2014-12-30 MED ORDER — DIAZEPAM 5 MG PO TABS
5.0000 mg | ORAL_TABLET | Freq: Once | ORAL | Status: AC
  Administered 2014-12-30: 5 mg via ORAL
  Filled 2014-12-30: qty 1

## 2014-12-30 MED ORDER — IOHEXOL 300 MG/ML  SOLN
80.0000 mL | Freq: Once | INTRAMUSCULAR | Status: AC | PRN
  Administered 2014-12-30: 80 mL via INTRAVENOUS

## 2014-12-30 MED ORDER — IOHEXOL 240 MG/ML SOLN: 25.0000 mL | Freq: Once | INTRAMUSCULAR | Status: DC | PRN

## 2014-12-30 NOTE — ED Provider Notes (Signed)
[facility] Emergency Department Provider Note     Time seen: ----------------------------------------- 3:50 PM on 12/30/2014 -----------------------------------------    I have reviewed the triage vital signs and the nursing notes.   HISTORY  Chief Complaint Abdominal Pain    HPI [name] is a 74 y.o. female who presents to ER with abdominal pain and indigestion. Patient states she was here on Saturday for foot pain and abdominal pain. She states she has slow transit constipation and abdominal pain. States she sees Dr. Collene Mares in [facility] for gastroenterology. She denies fevers chills or other complaints, had normal labs done several days ago. Pain is dull, nonfocal. Patient reports more pain around the umbilicus.   Past Medical History  Diagnosis Date  . Arthritis, rheumatoid   . Thyroid binding globulin deficiency     Patient Active Problem List   Diagnosis Date Noted  . HYPOTHYROIDISM 08/27/2009  . TOBACCO ABUSE 08/27/2009  . RENAL ARTERY STENOSIS 08/27/2009  . COPD 08/27/2009  . GERD 08/27/2009  . ARTHRITIS, RHEUMATOID, HX OF 08/27/2009  . COLONIC POLYPS 08/03/2007  . ANXIETY 08/03/2007  . CHRONIC PAIN SYNDROME 08/03/2007  . HEMORRHOIDS 08/03/2007  . GASTRITIS 08/03/2007  . DIVERTICULOSIS, COLON 08/03/2007  . FIBROCYSTIC BREAST DISEASE 08/03/2007  . OSTEOPENIA 08/03/2007  . CHEST PAIN, ATYPICAL 08/03/2007  . PERSONAL HISTORY OF COLONIC POLYPS 08/03/2007  . CONSTIPATION, CHRONIC, HX OF 08/03/2007    Past Surgical History  Procedure Laterality Date  . Cholecystectomy    . Tonsillectomy      Allergies Hydrocodone and Percocet  Social History History  Substance Use Topics  . Smoking status: Current Every Day Smoker -- 1.00 packs/day    Types: Cigarettes  . Smokeless tobacco: Not on file  . Alcohol Use: No    Review of Systems Constitutional: Negative for fever. Eyes: Negative for visual changes. ENT: Negative  for sore throat. Cardiovascular: Negative for chest pain. Respiratory: Negative for shortness of breath. Gastrointestinal: Positive for abdominal pain, constipation Genitourinary: Negative for dysuria. Musculoskeletal: Negative for back pain. Positive right ankle pain and swelling and ecchymosis noted around the right ankle laterally Skin: Negative for rash. Neurological: Negative for headaches, focal weakness or numbness.  10-point ROS otherwise negative.  ____________________________________________   PHYSICAL EXAM:  VITAL SIGNS: ED Triage Vitals  Enc Vitals Group     BP 12/30/14 1156 121/79 mmHg     Pulse Rate 12/30/14 1156 84     Resp 12/30/14 1156 20     Temp 12/30/14 1156 98 F (36.7 C)     Temp Source 12/30/14 1156 Oral     SpO2 12/30/14 1156 100 %     Weight 12/30/14 1154 94 lb (42.638 kg)     Height 12/30/14 1154 5\' 5"  (1.651 m)     Head Cir --      Peak Flow --      Pain Score 12/30/14 1154 8     Pain Loc --      Pain Edu? --      Excl. in [place]? --     Constitutional: Alert and oriented. Well appearing and in no distress. Eyes: Conjunctivae are normal. PERRL. Normal extraocular movements. ENT   Head: Normocephalic and atraumatic.   Nose: No congestion/rhinnorhea.   Mouth/Throat: Mucous membranes are moist.   Neck: No stridor. Hematological/Lymphatic/Immunilogical: No cervical lymphadenopathy. Cardiovascular: Normal rate, regular rhythm. Normal and symmetric distal pulses are present in all extremities. No murmurs, rubs, or gallops. Respiratory: Normal respiratory effort without tachypnea  nor retractions. Breath sounds are clear and equal bilaterally. No wheezes/rales/rhonchi. Gastrointestinal: Soft and nontender. No distention. No abdominal bruits. There is no CVA tenderness. Musculoskeletal: Nontender with normal range of motion in all extremities. No joint effusions.  No lower extremity tenderness nor edema. Neurologic:  Normal speech and  language. No gross focal neurologic deficits are appreciated. Speech is normal. No gait instability. Skin:  Skin is warm, dry and intact. Mild ecchymosis noted around the lateral malleolus and foot laterally. Psychiatric: Mood and affect are normal. Speech and behavior are normal. Patient exhibits appropriate insight and judgment. ____________________________________________  EKG: Interpreted by me. Normal sinus rhythm with occasional PVCs, rate is 83 bpm, normal PR interval, normal QRS with, normal QT interval. No evidence of acute infarction  ____________________________________________  ED COURSE:  Pertinent labs & imaging results that were available during my care of the patient were reviewed by me and considered in my medical decision making (see chart for details). Patient is in no acute distress, appears to have chronic pain ____________________________________________    LABS (pertinent positives/negatives)  Labs Reviewed  LIPASE, BLOOD - Abnormal; Notable for the following:    Lipase 21 (*)    All other components within normal limits  COMPREHENSIVE METABOLIC PANEL - Abnormal; Notable for the following:    Sodium 128 (*)    Chloride 93 (*)    Calcium 8.6 (*)    Total Protein 6.3 (*)    All other components within normal limits  CBC - Abnormal; Notable for the following:    RBC 3.70 (*)    Hemoglobin 11.9 (*)    HCT 33.5 (*)    All other components within normal limits    RADIOLOGY  CT abdomen and pelvis IMPRESSION: Minimal wall thickening of the distal descending and proximal sigmoid colon without significant adjacent inflammatory change or free fluid. Findings may represent under distention versus mild acute colitis.  Stable liver cysts and sub cm left renal cortical hypodensity likely a cyst but too small to characterize.  Diverticulosis of the colon.  Mild stable compression along the left side of the superior endplate of  L3. ____________________________________________  FINAL ASSESSMENT AND PLAN  Abdominal pain, right ankle pain  Plan: Patient with labs and imaging as dictated above. Patient is in no acute distress, no clear cause for her symptoms. She stable for outpatient follow-up doctor or with their gastroenterologist.   Earleen Newport, MD   Earleen Newport, MD 12/30/14 [phone]

## 2014-12-30 NOTE — Discharge Instructions (Signed)

## 2014-12-30 NOTE — ED Notes (Signed)
Pt presents with RLQ pain and indigestion. Pt was seen here on Saturday for right foot pain. Pt was then started on doxycycline and has only had two doses and started having abd pain. Pt was also given po potassium for  Low levels on Saturday. Pt denies any n/v.

## 2014-12-30 NOTE — ED Notes (Signed)
Pt arrives with complaints of lower abd pain, nausea and vomiting and cellulitis to right ankle, pt was seen in Er on Saturday for same symptoms

## 2015-02-13 DIAGNOSIS — J449 Chronic obstructive pulmonary disease, unspecified: Secondary | ICD-10-CM | POA: Insufficient documentation

## 2015-02-13 DIAGNOSIS — R0789 Other chest pain: Secondary | ICD-10-CM | POA: Insufficient documentation

## 2015-02-13 DIAGNOSIS — K5900 Constipation, unspecified: Secondary | ICD-10-CM | POA: Insufficient documentation

## 2015-02-13 DIAGNOSIS — K2970 Gastritis, unspecified, without bleeding: Secondary | ICD-10-CM | POA: Insufficient documentation

## 2015-02-13 DIAGNOSIS — E0789 Other specified disorders of thyroid: Secondary | ICD-10-CM | POA: Insufficient documentation

## 2015-02-13 DIAGNOSIS — R109 Unspecified abdominal pain: Secondary | ICD-10-CM | POA: Insufficient documentation

## 2015-02-13 DIAGNOSIS — N6019 Diffuse cystic mastopathy of unspecified breast: Secondary | ICD-10-CM | POA: Insufficient documentation

## 2015-02-13 DIAGNOSIS — K5790 Diverticulosis of intestine, part unspecified, without perforation or abscess without bleeding: Secondary | ICD-10-CM | POA: Insufficient documentation

## 2015-02-13 DIAGNOSIS — N644 Mastodynia: Secondary | ICD-10-CM | POA: Insufficient documentation

## 2015-02-13 DIAGNOSIS — K219 Gastro-esophageal reflux disease without esophagitis: Secondary | ICD-10-CM | POA: Insufficient documentation

## 2015-02-13 DIAGNOSIS — F419 Anxiety disorder, unspecified: Secondary | ICD-10-CM | POA: Insufficient documentation

## 2015-02-13 DIAGNOSIS — Z860100 Personal history of colon polyps, unspecified: Secondary | ICD-10-CM | POA: Insufficient documentation

## 2015-02-13 DIAGNOSIS — K649 Unspecified hemorrhoids: Secondary | ICD-10-CM | POA: Insufficient documentation

## 2015-02-13 DIAGNOSIS — M8580 Other specified disorders of bone density and structure, unspecified site: Secondary | ICD-10-CM | POA: Insufficient documentation

## 2015-02-13 DIAGNOSIS — R079 Chest pain, unspecified: Secondary | ICD-10-CM | POA: Insufficient documentation

## 2015-02-13 DIAGNOSIS — M069 Rheumatoid arthritis, unspecified: Secondary | ICD-10-CM | POA: Insufficient documentation

## 2015-02-13 DIAGNOSIS — Z720 Tobacco use: Secondary | ICD-10-CM | POA: Insufficient documentation

## 2015-02-13 DIAGNOSIS — E039 Hypothyroidism, unspecified: Secondary | ICD-10-CM | POA: Insufficient documentation

## 2015-02-13 DIAGNOSIS — I701 Atherosclerosis of renal artery: Secondary | ICD-10-CM | POA: Insufficient documentation

## 2015-02-18 ENCOUNTER — Encounter: Payer: Self-pay | Admitting: Cardiology

## 2015-02-18 ENCOUNTER — Ambulatory Visit (INDEPENDENT_AMBULATORY_CARE_PROVIDER_SITE_OTHER): Payer: Medicare Other | Admitting: Cardiology

## 2015-02-18 VITALS — BP 100/62 | HR 92 | Ht 63.0 in | Wt 91.0 lb

## 2015-02-18 DIAGNOSIS — K219 Gastro-esophageal reflux disease without esophagitis: Secondary | ICD-10-CM

## 2015-02-18 DIAGNOSIS — M069 Rheumatoid arthritis, unspecified: Secondary | ICD-10-CM | POA: Diagnosis not present

## 2015-02-18 DIAGNOSIS — R0789 Other chest pain: Secondary | ICD-10-CM

## 2015-02-18 NOTE — Progress Notes (Signed)
Cardiology Office Note   Date:  02/18/2015   ID:  [name], [name] [birth year], MRN [id number]  PCP:  Rusty Aus., MD  Cardiologist:   Candee Furbish, MD   Chief Complaint  Patient presents with  . New Patient (Initial Visit)      History of Present Illness: [name] is a 74 y.o. female former patient of Dr. Maren Beach here to establish care. Last clinic visit was reviewed from 2008 where a nuclear stress test was reviewed, she exercised for 7-1/2 minutes with no ST segment changes in perfusion images were normal as well as ejection fraction. Apparently she had been told at [facility] but there was a mass that was getting bigger. She had extensive workup, referral to pulmonary was requested at that time. Dr. Lia Foyer stated that he was not sure of the etiology of her findings. She was smoking at the time. She also had rheumatoid arthritis and took many medications.  Here today with complaint of previous epigastric chest discomfort that radiated up her chest wall bilaterally. She had one isolated episode of left arm pain at night. She does not recall this being positional or having any muscular strain associated with this. She had seen her gastroenterologist recently who is treating her for GERD and they suggested she get a cardiac evaluation based upon her chest and symptoms. Brother had CBAG. She is still smoking.   Currently she is chest pain-free. She has problems maintaining weight.      Past Medical History  Diagnosis Date  . Arthritis, rheumatoid   . Thyroid binding globulin deficiency   . Breast pain   . GERD (gastroesophageal reflux disease)   . Chest pain   . Tobacco abuse   . Chest discomfort   . Abdominal pain   . Hypothyroidism   . Renal artery stenosis   . COPD (chronic obstructive pulmonary disease)   . Hx of colonic polyps   . Anxiety   . Chronic pain syndrome   . Hemorrhoids   . Gastritis   . Diverticulosis   . Fibrocystic breast disease   .  Osteopenia   . Constipation   . Right ankle pain     Past Surgical History  Procedure Laterality Date  . Cholecystectomy    . Tonsillectomy    . Leg surgery       Current Outpatient Prescriptions  Medication Sig Dispense Refill  . ALPRAZolam (XANAX) 0.5 MG tablet Take by mouth.    . cyanocobalamin (,VITAMIN B-12,) 1000 MCG/ML injection Inject 1,000 mcg into the muscle once. Pt gets every 6 weeks    . dexlansoprazole (DEXILANT) 60 MG capsule Take 60 mg by mouth daily.    . DULoxetine (CYMBALTA) 20 MG capsule Take 20 mg by mouth daily.    Marland Kitchen esomeprazole (NEXIUM) 40 MG capsule Take 40 mg by mouth 2 (two) times daily before a meal.    . fluticasone (FLONASE) 50 MCG/ACT nasal spray Place 2 sprays into the nose daily.    . hydroxychloroquine (PLAQUENIL) 200 MG tablet TAKE ONE TABLET TWICE DAILY    . levothyroxine (SYNTHROID, LEVOTHROID) 88 MCG tablet Take 88 mcg by mouth daily.    . Methotrexate Sodium (METHOTREXATE, PF,) 200 MG/8ML injection ADMINISTER 0.8MLS ONCE A WEEK    . Multiple Vitamins-Minerals (MULTIVITAMIN WITH MINERALS) tablet Take 1 tablet by mouth daily.    Marland Kitchen omeprazole (PRILOSEC) 20 MG capsule Take 20 mg by mouth.    . potassium chloride SA (K-DUR,KLOR-CON) 20 MEQ  tablet Take 1 tablet (20 mEq total) by mouth daily. 3 tablet 0  . Probiotic Product (ALIGN PO) Take by mouth as needed.    . ranitidine (ZANTAC) 150 MG tablet Take 150 mg by mouth 2 (two) times daily.    . sertraline (ZOLOFT) 50 MG tablet Take 50 mg by mouth daily.    . traZODone (DESYREL) 50 MG tablet Take 50 mg by mouth at bedtime.    . vitamin C (ASCORBIC ACID) 500 MG tablet Take 500 mg by mouth daily.    Marland Kitchen VITAMIN D, CHOLECALCIFEROL, PO Take 1 tablet by mouth daily.    Marland Kitchen senna (SENOKOT) 8.6 MG tablet Take 16 mg by mouth.     No current facility-administered medications for this visit.    Allergies:   Etrafon; Hydrocodone; Mirtazapine; Doxycycline; and Percocet    Social History:  The patient  reports  that she has been smoking Cigarettes.  She has been smoking about 1.00 pack per day. She uses smokeless tobacco. She reports that she does not use illicit drugs.   Family History:  The patient's family history includes Heart attack in her brother; Heart disease in her sister; Hyperlipidemia in her brother; Hypertension in her brother.    ROS:  Please see the history of present illness.   Otherwise, review of systems are positive for generalized fatigue, body aches at times, arthralgias, anxiety..   All other systems are reviewed and negative.    PHYSICAL EXAM: VS:  BP 100/62 mmHg  Pulse 92  Ht 5\' 3"  (1.6 m)  Wt 91 lb (41.277 kg)  BMI 16.12 kg/m2 , BMI Body mass index is 16.12 kg/(m^2). GEN: Thin, in no acute distress, well dressed HEENT: normal Neck: no JVD, carotid bruits, or masses Cardiac: RRR; no murmurs, rubs, or gallops,no edema no ectopy Respiratory:  clear to auscultation bilaterally, normal work of breathing GI: soft, nontender, nondistended, + BS, scaphoid MS: no deformity or atrophy Skin: warm and dry, no rash Neuro:  Strength and sensation are intact Pulses: Palpable distal pulses Psych: euthymic mood, full affect   EKG:  EKG is not ordered today. The ekg from 7/25 sets 16 shows sinus rhythm with PVCs, nonspecific ST-T wave changes   Recent Labs: 12/30/2014: ALT 17; BUN 6; Creatinine, Ser 0.76; Hemoglobin 11.9*; Platelets 257; Potassium 3.5; Sodium 128*    Lipid Panel No results found for: CHOL, TRIG, HDL, CHOLHDL, VLDL, LDLCALC, LDLDIRECT    Wt Readings from Last 3 Encounters:  02/18/15 91 lb (41.277 kg)  12/30/14 94 lb (42.638 kg)  12/28/14 94 lb (42.638 kg)      Other studies Reviewed: Additional studies/ records that were reviewed today include: Prior paper chart records and scanned in office documents reviewed. Lab work reviewed. EKGs reviewed. Review of the above records demonstrates: As above   ASSESSMENT AND PLAN:  1.  Atypical chest pain/left  arm pain-with her advanced age, rheumatoid arthritis, continued smoking and brother who had open-heart surgery, we will proceed with nuclear stress test for further risk stratification. Pharmacologic test. She would not be able to walk the treadmill. Hopefully if low risk, this will be reassuring. Continue with treatment for GERD otherwise. Left arm pain could have been musculoskeletal. We shall see.  2. Smoking-encourage cessation. She is not ready to quit.  3. Rheumatoid arthritis-medications reviewed per rheumatology.  4. GERD - per GI. Medications reviewed.   Current medicines are reviewed at length with the patient today.  The patient does not have concerns regarding medicines.  The following changes have been made:  no change  Labs/ tests ordered today include: Pharmacologic nuclear stress test.  No orders of the defined types were placed in this encounter.     Disposition:   We will go ahead and follow-up with results of stress test.  Signed, Candee Furbish, MD  02/18/2015 11:43 AM    [facility], [place], [place]  [address] Phone: [phone]; Fax: [phone]

## 2015-02-18 NOTE — Patient Instructions (Signed)
Medication Instructions:  The current medical regimen is effective;  continue present plan and medications.  Testing/Procedures: Your physician has requested that you have a lexiscan myoview. For further information please visit [email or web address]. Please follow instruction sheet, as given.  Follow-Up: Follow up as needed after testing.  Thank you for choosing [facility]!!     

## 2015-02-25 ENCOUNTER — Encounter: Payer: Self-pay | Admitting: Cardiology

## 2015-02-26 ENCOUNTER — Telehealth (HOSPITAL_COMMUNITY): Payer: Self-pay | Admitting: *Deleted

## 2015-02-26 NOTE — Telephone Encounter (Signed)
Patient given detailed instructions per Myocardial Perfusion Study Information Sheet for test on 03/03/15 at 915 Patient notified to arrive 15 minutes early and that it is imperative to arrive on time for appointment to keep from having the test rescheduled.  If you need to cancel or reschedule your appointment, please call the office within 24 hours of your appointment. Failure to do so may result in a cancellation of your appointment, and a $50 no show fee. Patient verbalized understanding. Hubbard Robinson, RN

## 2015-03-03 ENCOUNTER — Encounter (HOSPITAL_COMMUNITY): Payer: Medicare Other

## 2015-03-04 DIAGNOSIS — M8080XA Other osteoporosis with current pathological fracture, unspecified site, initial encounter for fracture: Secondary | ICD-10-CM | POA: Insufficient documentation

## 2015-03-07 DIAGNOSIS — M1A00X0 Idiopathic chronic gout, unspecified site, without tophus (tophi): Secondary | ICD-10-CM | POA: Insufficient documentation

## 2015-03-19 ENCOUNTER — Other Ambulatory Visit: Payer: Self-pay | Admitting: Internal Medicine

## 2015-03-19 DIAGNOSIS — R0602 Shortness of breath: Secondary | ICD-10-CM

## 2015-03-20 ENCOUNTER — Ambulatory Visit
Admission: RE | Admit: 2015-03-20 | Discharge: 2015-03-20 | Disposition: A | Discharge status: Home / Self Care | Payer: Medicare Other | Source: Ambulatory Visit | Attending: Internal Medicine | Admitting: Internal Medicine

## 2015-03-20 DIAGNOSIS — R911 Solitary pulmonary nodule: Secondary | ICD-10-CM | POA: Diagnosis not present

## 2015-03-20 DIAGNOSIS — R0602 Shortness of breath: Secondary | ICD-10-CM | POA: Insufficient documentation

## 2015-03-20 HISTORY — DX: Systemic involvement of connective tissue, unspecified: M35.9

## 2015-03-20 MED ORDER — IOHEXOL 300 MG/ML  SOLN
75.0000 mL | Freq: Once | INTRAMUSCULAR | Status: AC | PRN
  Administered 2015-03-20: 60 mL via INTRAVENOUS

## 2015-03-21 ENCOUNTER — Other Ambulatory Visit: Payer: Self-pay | Admitting: Internal Medicine

## 2015-03-21 DIAGNOSIS — I38 Endocarditis, valve unspecified: Secondary | ICD-10-CM | POA: Insufficient documentation

## 2015-03-21 DIAGNOSIS — I6523 Occlusion and stenosis of bilateral carotid arteries: Secondary | ICD-10-CM

## 2015-03-25 ENCOUNTER — Ambulatory Visit
Admission: RE | Admit: 2015-03-25 | Discharge: 2015-03-25 | Disposition: A | Discharge status: Home / Self Care | Payer: Medicare Other | Source: Ambulatory Visit | Attending: Internal Medicine | Admitting: Internal Medicine

## 2015-03-25 DIAGNOSIS — I6523 Occlusion and stenosis of bilateral carotid arteries: Secondary | ICD-10-CM | POA: Insufficient documentation

## 2015-05-07 ENCOUNTER — Encounter: Payer: Medicare Other | Attending: Surgery | Admitting: Surgery

## 2015-05-07 DIAGNOSIS — M069 Rheumatoid arthritis, unspecified: Secondary | ICD-10-CM | POA: Insufficient documentation

## 2015-05-07 DIAGNOSIS — L02521 Furuncle right hand: Secondary | ICD-10-CM | POA: Diagnosis not present

## 2015-05-07 DIAGNOSIS — X58XXXA Exposure to other specified factors, initial encounter: Secondary | ICD-10-CM | POA: Insufficient documentation

## 2015-05-07 DIAGNOSIS — L97222 Non-pressure chronic ulcer of left calf with fat layer exposed: Secondary | ICD-10-CM | POA: Diagnosis not present

## 2015-05-07 DIAGNOSIS — T8131XA Disruption of external operation (surgical) wound, not elsewhere classified, initial encounter: Secondary | ICD-10-CM | POA: Insufficient documentation

## 2015-05-07 DIAGNOSIS — F17210 Nicotine dependence, cigarettes, uncomplicated: Secondary | ICD-10-CM | POA: Insufficient documentation

## 2015-05-07 DIAGNOSIS — J449 Chronic obstructive pulmonary disease, unspecified: Secondary | ICD-10-CM | POA: Insufficient documentation

## 2015-05-14 ENCOUNTER — Encounter: Payer: Medicare Other | Attending: Surgery | Admitting: Surgery

## 2015-05-14 DIAGNOSIS — X58XXXA Exposure to other specified factors, initial encounter: Secondary | ICD-10-CM | POA: Insufficient documentation

## 2015-05-14 DIAGNOSIS — M069 Rheumatoid arthritis, unspecified: Secondary | ICD-10-CM | POA: Insufficient documentation

## 2015-05-14 DIAGNOSIS — T8131XA Disruption of external operation (surgical) wound, not elsewhere classified, initial encounter: Secondary | ICD-10-CM | POA: Insufficient documentation

## 2015-05-14 DIAGNOSIS — L02521 Furuncle right hand: Secondary | ICD-10-CM | POA: Diagnosis not present

## 2015-05-14 DIAGNOSIS — R600 Localized edema: Secondary | ICD-10-CM | POA: Diagnosis not present

## 2015-05-14 DIAGNOSIS — L97222 Non-pressure chronic ulcer of left calf with fat layer exposed: Secondary | ICD-10-CM | POA: Diagnosis not present

## 2015-05-14 DIAGNOSIS — J449 Chronic obstructive pulmonary disease, unspecified: Secondary | ICD-10-CM | POA: Insufficient documentation

## 2015-05-14 DIAGNOSIS — F17210 Nicotine dependence, cigarettes, uncomplicated: Secondary | ICD-10-CM | POA: Diagnosis not present
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
## 2015-05-14 NOTE — Progress Notes (Signed)
YEILYN, ROBERTA ([id number]) Visit Report for 05/07/2015 Chief Complaint Document Details Patient Name: RIONNA, GLEED Date of Service: 05/07/2015 9:45 AM Medical Record Patient Account Number: [id number] Number: Treating RN: Cornell Barman 07-Apr-1941 (74 y.o. Other Clinician: Date of Birth/Sex: Female) Treating BURNS III, Primary Care Physician: Emily Filbert Physician/Extender: Thayer Jew Referring Physician: Suella Grove in Treatment: 0 Information Obtained from: Patient Chief Complaint Left calf and right finger ulceration Electronic Signature(s) Signed: 05/07/2015 4:02:10 PM By: Loletha Grayer MD Entered By: Loletha Grayer on 05/07/2015 13:17:18 Leone Payor ([id number]) -------------------------------------------------------------------------------- Debridement Details Patient Name: Leone Payor Date of Service: 05/07/2015 9:45 AM Medical Record Patient Account Number: [id number] Number: Treating RN: Cornell Barman 07-19-1940 (74 y.o. Other Clinician: Date of Birth/Sex: Female) Treating BURNS III, Primary Care Physician: Emily Filbert Physician/Extender: Thayer Jew Referring Physician: Suella Grove in Treatment: 0 Debridement Performed for Wound #2 Left,Anterior Lower Leg Assessment: Performed By: Physician BURNS III, Teressa Senter., MD Debridement: Debridement Pre-procedure Yes Verification/Time Out Taken: Start Time: 11:29 Pain Control: Other : lidocaine 4% Level: Skin/Subcutaneous Tissue Total Area Debrided (L x 0.5 (cm) x 0.5 (cm) = 0.25 (cm) W): Tissue and other Viable, Non-Viable, Fat, Fibrin/Slough material debrided: Instrument: Curette Bleeding: Minimum Hemostasis Achieved: Pressure End Time: 11:31 Procedural Pain: 0 Post Procedural Pain: 0 Response to Treatment: Procedure was tolerated well Post Debridement Measurements of Total Wound Length: (cm) 0.5 Width: (cm) 0.5 Depth: (cm) 0.4 Volume: (cm) 0.079 Post Procedure  Diagnosis Same as Pre-procedure Electronic Signature(s) Signed: 05/07/2015 4:02:10 PM By: Loletha Grayer MD Signed: 05/12/2015 5:12:08 PM By: Gretta Cool RN, BSN, Kim RN, BSN Entered By: Loletha Grayer on 05/07/2015 13:15:59 Leone Payor ([id number]) -------------------------------------------------------------------------------- Debridement Details Patient Name: Leone Payor Date of Service: 05/07/2015 9:45 AM Medical Record Patient Account Number: [id number] Number: Treating RN: Cornell Barman 09-Mar-1941 (74 y.o. Other Clinician: Date of Birth/Sex: Female) Treating BURNS III, Primary Care Physician: Emily Filbert Physician/Extender: Thayer Jew Referring Physician: Suella Grove in Treatment: 0 Debridement Performed for Wound #1 Right,Posterior Hand - 4th Digit Assessment: Performed By: Physician BURNS III, Teressa Senter., MD Debridement: Open Wound/Selective Debridement Selective Description: Pre-procedure Yes Verification/Time Out Taken: Start Time: 11:29 Pain Control: Other : lidocaine 4% Total Area Debrided (L x 1 (cm) x 1.5 (cm) = 1.5 (cm) W): Tissue and other Non-Viable, Callus, Exudate, Fibrin/Slough material debrided: Instrument: Curette Bleeding: None End Time: 11:31 Procedural Pain: 0 Post Procedural Pain: 0 Response to Treatment: Procedure was tolerated well Post Debridement Measurements of Total Wound Length: (cm) 1 Width: (cm) 1.5 Depth: (cm) 0.2 Volume: (cm) 0.2 Post Procedure Diagnosis Same as Pre-procedure Electronic Signature(s) Signed: 05/07/2015 4:02:10 PM By: Loletha Grayer MD Signed: 05/12/2015 5:12:08 PM By: Gretta Cool RN, BSN, Kim RN, BSN Entered By: Loletha Grayer on 05/07/2015 13:16:30 Leone Payor ([id number]) -------------------------------------------------------------------------------- HPI Details Patient Name: Leone Payor Date of Service: 05/07/2015 9:45 AM Medical Record Patient Account Number:  [id number] Number: Treating RN: Cornell Barman 10-17-1940 (74 y.o. Other Clinician: Date of Birth/Sex: Female) Treating BURNS III, Primary Care Physician: Emily Filbert Physician/Extender: Thayer Jew Referring Physician: Suella Grove in Treatment: 0 History of Present Illness HPI Description: Pleasant 74 year old with history of rheumatoid arthritis, smoking, and COPD. No history of diabetes or PAD. Left ABI 1.2. 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 Pathology reportedly showed clear margins. The wound was repaired primarily but dehisced. She developed localized edema and was treated with Unna boots without significant improvement. Completed a course of doxycycline. Currently  performing dressing changes with mupirocin cream and triamcinolone cream. She has compression stockings but cannot apply them secondary to weakness in her hands. She complains of a raised area on the dorsum of her right ring finger which she attributes to trauma from a ring. She denies any significant pain. No fever or chills. No significant drainage. Electronic Signature(s) Signed: 05/07/2015 4:02:10 PM By: Loletha Grayer MD Entered By: Loletha Grayer on 05/07/2015 13:23:32 Leone Payor ([id number]) -------------------------------------------------------------------------------- Physical Exam Details Patient Name: Leone Payor Date of Service: 05/07/2015 9:45 AM Medical Record Patient Account Number: [id number] Number: Treating RN: Cornell Barman 1940/12/17 (74 y.o. Other Clinician: Date of Birth/Sex: Female) Treating BURNS III, Primary Care Physician: Emily Filbert Physician/Extender: Thayer Jew Referring Physician: Suella Grove in Treatment: 0 Constitutional . Pulse regular. Respirations normal and unlabored. Afebrile. Marland Kitchen Respiratory WNL. No retractions.. Cardiovascular Pedal Pulses WNL. Integumentary (Hair, Skin) .Marland Kitchen Neurological Sensation normal to  touch, pin,and vibration. Psychiatric Judgement and insight Intact.. Oriented times 3.. No evidence of depression, anxiety, or agitation.. Notes Left anterior calf ulceration. Full thickness. No exposed deep structures. No evidence for infection. Minimal edema. Palpable DP. L ABI 1.2. Raised hypertrophic callus on right ring finger. No drainage or infection. Electronic Signature(s) Signed: 05/07/2015 4:02:10 PM By: Loletha Grayer MD Entered By: Loletha Grayer on 05/07/2015 13:25:24 Leone Payor ([id number]) -------------------------------------------------------------------------------- Physician Orders Details Patient Name: Leone Payor Date of Service: 05/07/2015 9:45 AM Medical Record Patient Account Number: [id number] Number: Treating RN: Ahmed Prima 02/23/1941 (74 y.o. Other Clinician: Date of Birth/Sex: Female) Treating BURNS III, Primary Care Physician: Emily Filbert Physician/Extender: Thayer Jew Referring Physician: Suella Grove in Treatment: 0 Verbal / Phone Orders: Yes Clinician: Carolyne Fiscal, Debi Read Back and Verified: Yes Diagnosis Coding Wound Cleansing Wound #1 Right,Posterior Hand - 4th Digit o Clean wound with Normal Saline. Wound #2 Left,Anterior Lower Leg o Clean wound with Normal Saline. Primary Wound Dressing Wound #1 Right,Posterior Hand - 4th Digit o Other: - mupirocin cream Wound #2 Left,Anterior Lower Leg o Prisma Ag - prisma on inside wound and Triamcinolone cream around outside of wound Secondary Dressing Wound #1 Right,Posterior Hand - 4th Digit o Gauze and Kerlix/Conform Wound #2 Left,Anterior Lower Leg o Gauze and Kerlix/Conform Dressing Change Frequency Wound #1 Right,Posterior Hand - 4th Digit o Change dressing every day. Wound #2 Left,Anterior Lower Leg o Change dressing every other day. Follow-up Appointments Wound #1 Right,Posterior Hand - 4th Digit o Return Appointment in 1 week. Wound #2  Left,Anterior Lower Leg o Return Appointment in 1 week. KIMELA, ADCOX ([id number]) Edema Control Wound #1 Right,Posterior Hand - 4th Digit o Tubigrip - left leg Wound #2 Left,Anterior Lower Leg o Tubigrip - left leg Electronic Signature(s) Signed: 05/07/2015 4:02:10 PM By: Loletha Grayer MD Signed: 05/13/2015 6:00:56 PM By: Alric Quan Entered By: Alric Quan on 05/07/2015 11:40:56 Leone Payor ([id number]) -------------------------------------------------------------------------------- Problem List Details Patient Name: Leone Payor Date of Service: 05/07/2015 9:45 AM Medical Record Patient Account Number: [id number] Number: Treating RN: Cornell Barman 30-Sep-1940 (74 y.o. 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 T81.31XA Disruption of external operation (surgical) wound, not 05/07/2015 Yes elsewhere classified, initial encounter M06.9 Rheumatoid arthritis, unspecified 05/07/2015 Yes J44.9 Chronic obstructive pulmonary disease, unspecified 05/07/2015 Yes L02.521 Furuncle right hand 05/07/2015  Yes Inactive Problems Resolved Problems Electronic Signature(s) Signed: 05/07/2015 4:02:10 PM By: Loletha Grayer MD Entered By: Loletha Grayer on 05/07/2015 13:16:57 Leone Payor ([id number]) -------------------------------------------------------------------------------- Progress Note/History and Physical Details Patient Name: Leone Payor Date of Service: 05/07/2015 9:45 AM Medical Record Patient Account Number: [id number] Number: Treating RN: Cornell Barman [birth year] (74 y.o. Other Clinician: Date of Birth/Sex: Female)  Treating BURNS III, Primary Care Physician: Emily Filbert Physician/Extender: Thayer Jew Referring Physician: Suella Grove in Treatment: 0 Subjective Chief Complaint Information obtained from Patient Left calf and right finger ulceration History of Present Illness (HPI) Pleasant 74 year old with history of rheumatoid arthritis, smoking, and COPD. No history of diabetes or PAD. Left ABI 1.2. 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 Pathology reportedly showed clear margins. The wound was repaired primarily but dehisced. She developed localized edema and was treated with Unna boots without significant improvement. Completed a course of doxycycline. Currently performing dressing changes with mupirocin cream and triamcinolone cream. She has compression stockings but cannot apply them secondary to weakness in her hands. She complains of a raised area on the dorsum of her right ring finger which she attributes to trauma from a ring. She denies any significant pain. No fever or chills. No significant drainage. Wound History Patient presents with 2 open wounds that have been present for approximately 2 months. Patient has been treating wounds in the following manner: vasline. Laboratory tests have not been performed in the last month. Patient reportedly has not tested positive for an antibiotic resistant organism. Patient reportedly has not tested positive for osteomyelitis. Patient reportedly has not had testing performed to evaluate circulation in the legs. Patient experiences the following problems associated with their wounds: swelling. Patient History Information obtained from Patient. Allergies Percocet (Severity: Moderate, Reaction: vomiting), oxycodone (Severity: Moderate, Reaction: vomiting) Family History Cancer - Siblings, Diabetes - Siblings, Heart Disease - Siblings, Hypertension - Siblings, Stroke - Siblings, No family history of Hereditary  Spherocytosis, Kidney Disease, Lung Disease, Seizures, Thyroid Problems, Tuberculosis. TYFFANI, ZIMMERS ([id number]) Social History Current every day smoker - 3/4 pack a day, Marital Status - Widowed, Alcohol Use - Never, Drug Use - No History, Caffeine Use - Daily. Medical History Eyes Patient has history of Cataracts - removed and implants put in Respiratory Denies history of Chronic Obstructive Pulmonary Disease (COPD) Musculoskeletal Patient has history of Rheumatoid Arthritis Medical And Surgical History Notes Gastrointestinal IBS Musculoskeletal 3rd lumbar fx never fixed Review of Systems (ROS) Constitutional Symptoms (General Health) The patient has no complaints or symptoms. Eyes Complains or has symptoms of Glasses / Contacts - glasses. Ear/Nose/Mouth/Throat The patient has no complaints or symptoms. Hematologic/Lymphatic The patient has no complaints or symptoms. Respiratory The patient has no complaints or symptoms. Cardiovascular The patient has no complaints or symptoms. Endocrine Complains or has symptoms of Thyroid disease. Genitourinary The patient has no complaints or symptoms. Immunological The patient has no complaints or symptoms. Integumentary (Skin) Complains or has symptoms of Wounds, Swelling. Neurologic The patient has no complaints or symptoms. Oncologic The patient has no complaints or symptoms. Psychiatric Complains or has symptoms of Anxiety. MARYETTA, CSASZAR ([id number]) Objective Constitutional Pulse regular. Respirations normal and unlabored. Afebrile. Vitals Time Taken: 10:35 AM, Height: 64 in, Source: Stated, Weight: 88 lbs, Source: Stated, BMI: 15.1, Temperature: 97.7 F, Pulse: 80 bpm, Respiratory Rate: 18 breaths/min, Blood Pressure: 102/67 mmHg. Respiratory WNL. No retractions.. Cardiovascular Pedal Pulses WNL. Neurological Sensation normal to touch, pin,and vibration. Psychiatric Judgement and insight  Intact..  Oriented times 3.. No evidence of depression, anxiety, or agitation.. General Notes: Left anterior calf ulceration. Full thickness. No exposed deep structures. No evidence for infection. Minimal edema. Palpable DP. L ABI 1.2. Raised hypertrophic callus on right ring finger. No drainage or infection. Integumentary (Hair, Skin) Wound #1 status is Open. Original cause of wound was Gradually Appeared. The wound is located on the Right,Posterior Hand - 4th Digit. The wound measures 1cm length x 1.5cm width x 0.1cm depth; 1.178cm^2 area and 0.118cm^3 volume. The wound is limited to skin breakdown. There is no tunneling or undermining noted. There is a none present amount of drainage noted. The wound margin is flat and intact. There is medium (34-66%) pink granulation within the wound bed. There is no necrotic tissue within the wound bed. The periwound skin appearance had no abnormalities noted for color. The periwound skin appearance exhibited: Scarring, Dry/Scaly. Periwound temperature was noted as No Abnormality. The periwound has tenderness on palpation. Wound #2 status is Open. Original cause of wound was Surgical Injury. The wound is located on the Left,Anterior Lower Leg. The wound measures 0.5cm length x 0.5cm width x 0.3cm depth; 0.196cm^2 area and 0.059cm^3 volume. The wound is limited to skin breakdown. There is no tunneling or undermining noted. There is a none present amount of drainage noted. The wound margin is flat and intact. There is no granulation within the wound bed. There is a large (67-100%) amount of necrotic tissue within the wound bed including Adherent Slough. The periwound skin appearance had no abnormalities noted for texture. The periwound skin appearance exhibited: Dry/Scaly. NIKKIE, THORSON ([id number]) Assessment Active Problems ICD-10 [phone] - Non-pressure chronic ulcer of left calf with fat layer exposed T81.31XA - Disruption of external operation (surgical)  wound, not elsewhere classified, initial encounter M06.9 - Rheumatoid arthritis, unspecified J44.9 - Chronic obstructive pulmonary disease, unspecified L02.521 - Furuncle right hand Left anterior calf ulceration, status post excision of squamous cell carcinoma. Right ring finger callus. Procedures Wound #1 Wound #1 is a To be determined located on the Right,Posterior Hand - 4th Digit . There was an Open Wound debridement with total area of 1.5 sq cm performed by BURNS III, Teressa Senter., MD. with the following instrument(s): Curette to remove Non-Viable tissue/material including Exudate, Fibrin/Slough, and Callus after achieving pain control using Other (lidocaine 4%). A time out was conducted prior to the start of the procedure. There was no bleeding. The procedure was tolerated well with a pain level of 0 throughout and a pain level of 0 following the procedure. Post Debridement Measurements: 1cm length x 1.5cm width x 0.2cm depth; 0.2cm^3 volume. Post procedure Diagnosis Wound #1: Same as Pre-Procedure Wound #2 Wound #2 is a To be determined located on the Left,Anterior Lower Leg . There was a Skin/Subcutaneous Tissue Debridement [id number]) debridement with total area of 0.25 sq cm performed by BURNS III, Teressa Senter., MD. with the following instrument(s): Curette to remove Viable and Non-Viable tissue/material including Fat and Fibrin/Slough after achieving pain control using Other (lidocaine 4%). A time out was conducted prior to the start of the procedure. A Minimum amount of bleeding was controlled with Pressure. The procedure was tolerated well with a pain level of 0 throughout and a pain level of 0 following the procedure. Post Debridement Measurements: 0.5cm length x 0.5cm width x 0.4cm depth; 0.079cm^3 volume. Post procedure Diagnosis Wound #2: Same as Pre-Procedure KENLEIGH, ASHWELL ([id number]) Plan Wound Cleansing: Wound #1 Right,Posterior Hand - 4th Digit: Clean wound with  Normal Saline. Wound #2 Left,Anterior Lower Leg: Clean wound with Normal Saline. Primary Wound Dressing: Wound #1 Right,Posterior Hand - 4th Digit: Other: - mupirocin cream Wound #2 Left,Anterior Lower Leg: Prisma Ag - prisma on inside wound and Triamcinolone cream around outside of wound Secondary Dressing: Wound #1 Right,Posterior Hand - 4th Digit: Gauze and Kerlix/Conform Wound #2 Left,Anterior Lower Leg: Gauze and Kerlix/Conform Dressing Change Frequency: Wound #1 Right,Posterior Hand - 4th Digit: Change dressing every day. Wound #2 Left,Anterior Lower Leg: Change dressing every other day. Follow-up Appointments: Wound #1 Right,Posterior Hand - 4th Digit: Return Appointment in 1 week. Wound #2 Left,Anterior Lower Leg: Return Appointment in 1 week. Edema Control: Wound #1 Right,Posterior Hand - 4th Digit: Tubigrip - left leg Wound #2 Left,Anterior Lower Leg: Tubigrip - left leg Promogran Prisma to left calf ulceration. Tubigrip for edema control. Mupirocin cream to right ring finger skin lesion. Electronic Signature(s) Signed: 05/07/2015 4:02:10 PM By: Loletha Grayer MD Leone Payor ([id number]) Entered By: Loletha Grayer on 05/07/2015 13:30:59 Leone Payor ([id number]) -------------------------------------------------------------------------------- ROS/PFSH Details Patient Name: Leone Payor Date of Service: 05/07/2015 9:45 AM Medical Record Patient Account Number: [id number] Number: Treating RN: Ahmed Prima 23-Aug-1940 (74 y.o. Other Clinician: Date of Birth/Sex: Female) Treating BURNS III, Primary Care Physician: Emily Filbert Physician/Extender: Thayer Jew Referring Physician: Suella Grove in Treatment: 0 Label Progress Note Print Version as History and Physical for this encounter Information Obtained From Patient Wound History Do you currently have one or more open woundso Yes How many open wounds do you currently haveo  2 Approximately how long have you had your woundso 2 months How have you been treating your wound(s) until nowo vasline Has your wound(s) ever healed and then re-openedo No Have you had any lab work done in the past montho No Have you tested positive for an antibiotic resistant organism (MRSA, VRE)o No Have you tested positive for osteomyelitis (bone infection)o No Have you had any tests for circulation on your legso No Have you had other problems associated with your woundso Swelling Eyes Complaints and Symptoms: Positive for: Glasses / Contacts - glasses Medical History: Positive for: Cataracts - removed and implants put in Endocrine Complaints and Symptoms: Positive for: Thyroid disease Integumentary (Skin) Complaints and Symptoms: Positive for: Wounds; Swelling Psychiatric Complaints and Symptoms: Positive for: Anxiety Constitutional Symptoms (General Health) EARLYNE, FERDERER ([id number]) Complaints and Symptoms: No Complaints or Symptoms Ear/Nose/Mouth/Throat Complaints and Symptoms: No Complaints or Symptoms Hematologic/Lymphatic Complaints and Symptoms: No Complaints or Symptoms Respiratory Complaints and Symptoms: No Complaints or Symptoms Medical History: Negative for: Chronic Obstructive Pulmonary Disease (COPD) Cardiovascular Complaints and Symptoms: No Complaints or Symptoms Gastrointestinal Medical History: Past Medical History Notes: IBS Genitourinary Complaints and Symptoms: No Complaints or Symptoms Immunological Complaints and Symptoms: No Complaints or Symptoms Musculoskeletal Medical History: Positive for: Rheumatoid Arthritis Past Medical History Notes: 3rd lumbar fx never fixed Neurologic LAKITA, DULING ([id number]) Complaints and Symptoms: No Complaints or Symptoms Oncologic Complaints and Symptoms: No Complaints or Symptoms HBO Extended History Items Eyes: Cataracts Immunizations Immunization Notes: pt states that she is  unsure Family and Social History Cancer: Yes - Siblings; Diabetes: Yes - Siblings; Heart Disease: Yes - Siblings; Hereditary Spherocytosis: No; Hypertension: Yes - Siblings; Kidney Disease: No; Lung Disease: No; Seizures: No; Stroke: Yes - Siblings; Thyroid Problems: No; Tuberculosis: No; Current every day smoker - 3/4 pack a day; Marital Status - Widowed; Alcohol Use: Never; Drug Use: No History; Caffeine Use: Daily; Financial Concerns: No; Food, Clothing or  Shelter Needs: No; Support System Lacking: No; Transportation Concerns: No; Advanced Directives: No; Patient does not want information on Advanced Directives; Do not resuscitate: No; Living Will: Yes (Not Provided); Medical Power of Attorney: No Physician Affirmation I have reviewed and agree with the above information. Electronic Signature(s) Signed: 05/07/2015 4:02:10 PM By: Loletha Grayer MD Signed: 05/13/2015 6:00:56 PM By: Alric Quan Entered By: Loletha Grayer on 05/07/2015 13:27:53 Leone Payor ([id number]) -------------------------------------------------------------------------------- SuperBill Details Patient Name: Leone Payor Date of Service: 05/07/2015 Medical Record Patient Account Number: [id number] Number: Treating RN: Cornell Barman 10/22/1940 (74 y.o. Other Clinician: Date of Birth/Sex: Female) Treating BURNS III, Primary Care Physician: Emily Filbert Physician/Extender: Thayer Jew Referring Physician: Suella Grove in Treatment: 0 Diagnosis Coding ICD-10 Codes Code Description [phone] Non-pressure chronic ulcer of left calf with fat layer exposed Disruption of external operation (surgical) wound, not elsewhere classified, initial T81.31XA encounter M06.9 Rheumatoid arthritis, unspecified J44.9 Chronic obstructive pulmonary disease, unspecified L02.521 Furuncle right hand L84 Corns and callosities Facility Procedures CPT4 Code: [id number] Description: 11042 - DEB SUBQ TISSUE 20 SQ CM/<  ICD-10 Description Diagnosis L97.222 Non-pressure chronic ulcer of left calf with fat lay Modifier: er exposed Quantity: 1 CPT4 Code: [id number] Description: 97597 - DEBRIDE WOUND 1ST 20 SQ CM OR < ICD-10 Description Diagnosis L02.521 Furuncle right hand L84 Corns and callosities Modifier: Quantity: 1 Physician Procedures CPT4: Description Modifier Quantity Code [id number] - WC PHYS LEVEL 4 - NEW PT 1 ICD-10 Description Diagnosis L97.222 Non-pressure chronic ulcer of left calf with fat layer exposed T81.31XA Disruption of external operation (surgical) wound, not  elsewhere classified, initial encounter L02.521 Furuncle right hand SHONDIA, FLIKKEMA ([id number]) Electronic Signature(s) Signed: 05/07/2015 4:02:10 PM By: Loletha Grayer MD Entered By: Loletha Grayer on 05/07/2015 13:27:36

## 2015-05-14 NOTE — Progress Notes (Signed)
[name], [name] ([id number]) Visit Report for 05/07/2015 Abuse/Suicide Risk Screen Details Patient Name: [name], [name] Date of Service: 05/07/2015 9:45 AM Medical Record Patient Account Number: [id number] Number: Treating RN: Ahmed Prima 1940-08-10 (74 y.o. Other Clinician: Date of Birth/Sex: Female) Treating BURNS III, Primary Care Physician: Emily Filbert Physician/Extender: Thayer Jew Referring Physician: Suella Grove in Treatment: 0 Abuse/Suicide Risk Screen Items Answer ABUSE/SUICIDE RISK SCREEN: Has anyone close to you tried to hurt or harm you recentlyo No Do you feel uncomfortable with anyone in your familyo No Has anyone forced you do things that you didnot want to doo No Do you have any thoughts of harming yourselfo No Patient displays signs or symptoms of abuse and/or neglect. No Electronic Signature(s) Signed: 05/13/2015 6:00:56 PM By: Alric Quan Entered By: Alric Quan on 05/07/2015 11:16:48 [name] ([id number]) -------------------------------------------------------------------------------- Activities of Daily Living Details Patient Name: [name] Date of Service: 05/07/2015 9:45 AM Medical Record Patient Account Number: [id number] Number: Treating RN: Ahmed Prima 08/01/1940 (74 y.o. Other Clinician: Date of Birth/Sex: Female) Treating BURNS III, Primary Care Physician: Emily Filbert Physician/Extender: Thayer Jew Referring Physician: Suella Grove in Treatment: 0 Activities of Daily Living Items Answer Activities of Daily Living (Please select one for each item) Drive Automobile Completely Able Take Medications Completely Able Use Telephone Completely Able Care for Appearance Completely Able Use Toilet Completely Able Bath / Shower Completely Able Dress Self Completely Able Feed Self Completely Able Walk Completely Able Get In / Out Bed Completely Able Housework Completely Able Prepare Meals Completely  [place] for Self Completely Able Electronic Signature(s) Signed: 05/13/2015 6:00:56 PM By: Alric Quan Entered By: Alric Quan on 05/07/2015 11:16:53 [name] ([id number]) -------------------------------------------------------------------------------- Education Assessment Details Patient Name: [name] Date of Service: 05/07/2015 9:45 AM Medical Record Patient Account Number: [id number] Number: Treating RN: Ahmed Prima 1940-06-26 (74 y.o. Other Clinician: Date of Birth/Sex: Female) Treating BURNS III, Primary Care Physician: Emily Filbert Physician/Extender: Thayer Jew Referring Physician: Suella Grove in Treatment: 0 Primary Learner Assessed: Patient Learning Preferences/Education Level/Primary Language Learning Preference: Explanation, Printed Material Highest Education Level: College or Above Preferred Language: English Cognitive Barrier Assessment/Beliefs Language Barrier: No Translator Needed: No Memory Deficit: No Emotional Barrier: No Cultural/Religious Beliefs Affecting Medical No Care: Physical Barrier Assessment Impaired Vision: Yes Glasses Impaired Hearing: No Decreased Hand dexterity: No Knowledge/Comprehension Assessment Knowledge Level: High Comprehension Level: High Ability to understand written High instructions: Ability to understand verbal High instructions: Motivation Assessment Anxiety Level: Calm Cooperation: Cooperative Education Importance: Acknowledges Need Interest in Health Problems: Asks Questions Perception: Coherent Readiness to Engage in Self- High Management Activities: Electronic Signature(s) [name], [name] ([id number]) Signed: 05/13/2015 6:00:56 PM By: Alric Quan Entered By: Alric Quan on 05/07/2015 11:16:57 [name] ([id number]) -------------------------------------------------------------------------------- Fall Risk Assessment  Details Patient Name: [name] Date of Service: 05/07/2015 9:45 AM Medical Record Patient Account Number: [id number] Number: Treating RN: Ahmed Prima [birth year] (74 y.o. Other Clinician: Date of Birth/Sex: Female) Treating BURNS III, Primary Care Physician: Emily Filbert Physician/Extender: Thayer Jew Referring Physician: Suella Grove in Treatment: 0 Fall Risk Assessment Items Have you had 2 or more falls in the last 12 monthso 0 No Have you had any fall that resulted in injury in the last 12 monthso 0 No FALL RISK ASSESSMENT: History of falling - immediate or within 3 months 0 No Secondary diagnosis 0 No Ambulatory aid None/bed rest/wheelchair/nurse 0 No Crutches/cane/walker 0 No Furniture 0 No IV Access/Saline Lock 0 No Gait/Training Normal/bed rest/immobile  0 No Weak 10 Yes Impaired 0 No Mental Status Oriented to own ability 0 No Electronic Signature(s) Signed: 05/13/2015 6:00:56 PM By: Alric Quan Entered By: Alric Quan on 05/07/2015 11:17:01 [name] ([id number]) -------------------------------------------------------------------------------- Foot Assessment Details Patient Name: [name] Date of Service: 05/07/2015 9:45 AM Medical Record Patient Account Number: [id number] Number: Treating RN: Ahmed Prima 05/13/1941 (74 y.o. Other Clinician: Date of Birth/Sex: Female) Treating BURNS III, Primary Care Physician: Emily Filbert Physician/Extender: Thayer Jew Referring Physician: Suella Grove in Treatment: 0 Foot Assessment Items Site Locations + = Sensation present, - = Sensation absent, C = Callus, U = Ulcer R = Redness, W = Warmth, M = Maceration, PU = Pre-ulcerative lesion F = Fissure, S = Swelling, D = Dryness Assessment Right: Left: Other Deformity: No No Prior Foot Ulcer: No No Prior Amputation: No No Charcot Joint: No No Ambulatory Status: Gait: Electronic Signature(s) Signed: 05/13/2015 6:00:56 PM By:  Alric Quan Entered By: Alric Quan on 05/07/2015 11:17:14 [name] ([id number]) -------------------------------------------------------------------------------- Nutrition Risk Assessment Details Patient Name: [name] Date of Service: 05/07/2015 9:45 AM Medical Record Patient Account Number: [id number] Number: Treating RN: Ahmed Prima Jan 30, 1941 (74 y.o. Other Clinician: Date of Birth/Sex: Female) Treating BURNS III, Primary Care Physician: Emily Filbert Physician/Extender: Thayer Jew Referring Physician: Suella Grove in Treatment: 0 Height (in): 64 Weight (lbs): 88 Body Mass Index (BMI): 15.1 Nutrition Risk Assessment Items NUTRITION RISK SCREEN: I have an illness or condition that made me change the kind and/or 0 No amount of food I eat I eat fewer than two meals per day 0 No I eat few fruits and vegetables, or milk products 0 No I have three or more drinks of beer, liquor or wine almost every day 0 No I have tooth or mouth problems that make it hard for me to eat 0 No I don't always have enough money to buy the food I need 0 No I eat alone most of the time 1 Yes I take three or more different prescribed or over-the-counter drugs a 0 No day Without wanting to, I have lost or gained 10 pounds in the last six 0 No months I am not always physically able to shop, cook and/or feed myself 0 No Nutrition Protocols Good Risk Protocol Moderate Risk Protocol Electronic Signature(s) Signed: 05/13/2015 6:00:56 PM By: Alric Quan Entered By: Alric Quan on 05/07/2015 11:17:04

## 2015-05-15 NOTE — Progress Notes (Addendum)
SHALLAN, CINDRIC ([id number]) Visit Report for 05/14/2015 Arrival Information Details Patient Name: [name], [name] Date of Service: 05/14/2015 12:45 PM Medical Record Number: [id number] Patient Account Number: [id number] Date of Birth/Sex: 05-18-1941 (74 y.o. Female) Treating RN: Afful, RN, BSN, Velva Harman Primary Care Physician: Emily Filbert Other Clinician: Referring Physician: Emily Filbert Treating Physician/Extender: BURNS III, Charlean Sanfilippo in Treatment: 1 Visit Information History Since Last Visit Added or deleted any medications: No Patient Arrived: Ambulatory Any new allergies or adverse reactions: No Arrival Time: 12:52 Had a fall or experienced change in No Accompanied By: self activities of daily living that may affect Transfer Assistance: None risk of falls: Patient Identification Verified: Yes Signs or symptoms of abuse/neglect since last No Secondary Verification Process Yes visito Completed: Hospitalized since last visit: No Patient Requires Transmission-Based No Has Dressing in Place as Prescribed: Yes Precautions: Pain Present Now: No Patient Has Alerts: No Electronic Signature(s) Signed: 05/14/2015 12:55:00 PM By: Regan Lemming BSN, RN Entered By: Regan Lemming on 05/14/2015 12:55:00 Leone Payor ([id number]) -------------------------------------------------------------------------------- Encounter Discharge Information Details Patient Name: Leone Payor Date of Service: 05/14/2015 12:45 PM Medical Record Number: [id number] Patient Account Number: [id number] Date of Birth/Sex: 1941-04-12 (74 y.o. Female) Treating RN: Baruch Gouty, RN, BSN, Velva Harman Primary Care Physician: Emily Filbert Other Clinician: Referring Physician: Emily Filbert Treating Physician/Extender: BURNS III, Charlean Sanfilippo in Treatment: 1 Encounter Discharge Information Items Discharge Pain Level: 0 Discharge Condition: Stable Ambulatory Status: Ambulatory Discharge Destination:  Home Transportation: Private Auto Accompanied By: self Schedule Follow-up Appointment: No Medication Reconciliation completed and provided to Patient/Care No Birdella Sippel: Provided on Clinical Summary of Care: 05/14/2015 Form Type Recipient Paper Patient [place] Electronic Signature(s) Signed: 05/14/2015 1:23:22 PM By: Regan Lemming BSN, RN Previous Signature: 05/14/2015 1:22:07 PM Version By: Ruthine Dose Entered By: Regan Lemming on 05/14/2015 13:23:22 Leone Payor ([id number]) -------------------------------------------------------------------------------- Lower Extremity Assessment Details Patient Name: Leone Payor Date of Service: 05/14/2015 12:45 PM Medical Record Number: [id number] Patient Account Number: [id number] Date of Birth/Sex: 05-28-1941 (74 y.o. Female) Treating RN: Afful, RN, BSN, Velva Harman Primary Care Physician: Emily Filbert Other Clinician: Referring Physician: Emily Filbert Treating Physician/Extender: BURNS III, Charlean Sanfilippo in Treatment: 1 Edema Assessment Assessed: [Left: No] [Right: No] E[Left: dema] [Right: :] Calf Left: Right: Point of Measurement: 31.5 cm From Medial Instep cm cm Ankle Left: Right: Point of Measurement: 9.5 cm From Medial Instep cm cm Vascular Assessment Claudication: Claudication Assessment [Left:None] Pulses: Posterior Tibial Dorsalis Pedis Palpable: [Left:Yes] Extremity colors, hair growth, and conditions: Extremity Color: [Left:Normal] Hair Growth on Extremity: [Left:No] Temperature of Extremity: [Left:Warm] Capillary Refill: [Left:< 3 seconds] Toe Nail Assessment Left: Right: Thick: No Discolored: No Deformed: No Improper Length and Hygiene: No Electronic Signature(s) Signed: 05/14/2015 12:59:29 PM By: Regan Lemming BSN, RN Entered By: Regan Lemming on 05/14/2015 12:59:29 Leone Payor ([id number] Payor ([id number]) -------------------------------------------------------------------------------- Multi  Wound Chart Details Patient Name: Leone Payor Date of Service: 05/14/2015 12:45 PM Medical Record Number: [id number] Patient Account Number: [id number] Date of Birth/Sex: 08/31/1940 (74 y.o. Female) Treating RN: Baruch Gouty, RN, BSN, Velva Harman Primary Care Physician: Emily Filbert Other Clinician: Referring Physician: Emily Filbert Treating Physician/Extender: BURNS III, Charlean Sanfilippo in Treatment: 1 Vital Signs Height(in): 64 Pulse(bpm): 79 Weight(lbs): 88 Blood Pressure 123/70 (mmHg): Body Mass Index(BMI): 15 Temperature(F): 97.5 Respiratory Rate 18 (breaths/min): Photos: [1:No Photos] [2:No Photos] [N/A:N/A] Wound Location: [1:Right Hand - 4th Digit - Posterior] [2:Left Lower Leg - Anterior N/A] Wounding Event: [1:Gradually Appeared] [2:Surgical Injury] [N/A:N/A] Primary Etiology: [1:To be determined] [  2:To be determined] [N/A:N/A] Comorbid History: [1:Cataracts, Rheumatoid Arthritis] [2:Cataracts, Rheumatoid Arthritis] [N/A:N/A] Date Acquired: [1:04/06/2015] [2:04/06/2015] [N/A:N/A] Weeks of Treatment: [1:1] [2:1] [N/A:N/A] Wound Status: [1:Open] [2:Open] [N/A:N/A] Measurements L x W x D 0x0x0 [2:0.4x0.4x0.3] [N/A:N/A] (cm) Area (cm) : [1:0] [2:0.126] [N/A:N/A] Volume (cm) : [1:0] [2:0.038] [N/A:N/A] % Reduction in Area: [1:100.00%] [2:35.70%] [N/A:N/A] % Reduction in Volume: 100.00% [2:35.60%] [N/A:N/A] Classification: [1:Partial Thickness] [2:Partial Thickness] [N/A:N/A] Exudate Amount: [1:None Present] [2:Small] [N/A:N/A] Exudate Type: [1:N/A] [2:Serous] [N/A:N/A] Exudate Color: [1:N/A] [2:amber] [N/A:N/A] Wound Margin: [1:Flat and Intact] [2:Flat and Intact] [N/A:N/A] Granulation Amount: [1:Medium (34-66%)] [2:None Present (0%)] [N/A:N/A] Granulation Quality: [1:Pink] [2:N/A] [N/A:N/A] Necrotic Amount: [1:None Present (0%)] [2:Large (67-100%)] [N/A:N/A] Exposed Structures: [1:Fascia: No Fat: No Tendon: No Muscle: No Joint: No Bone: No] [2:Fascia: No Fat: No Tendon: No  Muscle: No Joint: No Bone: No] [N/A:N/A] Limited to Skin Limited to Skin Breakdown Breakdown Epithelialization: None None N/A Periwound Skin Texture: Scarring: Yes No Abnormalities Noted N/A Periwound Skin Dry/Scaly: Yes Moist: Yes N/A Moisture: Dry/Scaly: No Periwound Skin Color: No Abnormalities Noted No Abnormalities Noted N/A Temperature: No Abnormality No Abnormality N/A Tenderness on No No N/A Palpation: Wound Preparation: Ulcer Cleansing: Ulcer Cleansing: N/A Rinsed/Irrigated with Rinsed/Irrigated with Saline Saline Topical Anesthetic Topical Anesthetic Applied: None Applied: Other: lidocaine 4% Treatment Notes Electronic Signature(s) Signed: 05/14/2015 1:08:58 PM By: Regan Lemming BSN, RN Entered By: Regan Lemming on 05/14/2015 13:08:58 Leone Payor ([id number]) -------------------------------------------------------------------------------- Multi-Disciplinary Care Plan Details Patient Name: Leone Payor Date of Service: 05/14/2015 12:45 PM Medical Record Number: [id number] Patient Account Number: [id number] Date of Birth/Sex: 1940-09-20 (74 y.o. Female) Treating RN: Afful, RN, BSN, Velva Harman Primary Care Physician: Emily Filbert Other Clinician: Referring Physician: Emily Filbert Treating Physician/Extender: BURNS III, Charlean Sanfilippo in Treatment: 1 Active Inactive Abuse / Safety / Falls / Self Care Management Nursing Diagnoses: Potential for falls Goals: Patient will not experience any injury related to fire Date Initiated: 05/07/2015 Goal Status: Active Interventions: Assess fall risk on admission and as needed Notes: Nutrition Nursing Diagnoses: Potential for alteratiion in Nutrition/Potential for imbalanced nutrition Goals: Patient/caregiver agrees to and verbalizes understanding of need to use nutritional supplements and/or vitamins as prescribed Date Initiated: 05/07/2015 Goal Status: Active Interventions: Provide education on  nutrition Notes: Orientation to the Wound Care Program Nursing Diagnoses: Knowledge deficit related to the wound healing center program Goals: Patient/caregiver will verbalize understanding of the [place], [place]. ([id number]) Date Initiated: 05/07/2015 Goal Status: Active Interventions: Provide education on orientation to the wound center Notes: Wound/Skin Impairment Nursing Diagnoses: Impaired tissue integrity Knowledge deficit related to smoking impact on wound healing Knowledge deficit related to ulceration/compromised skin integrity Goals: Patient will demonstrate a reduced rate of smoking or cessation of smoking Date Initiated: 05/07/2015 Goal Status: Active Ulcer/skin breakdown will have a volume reduction of 30% by week 4 Date Initiated: 05/07/2015 Goal Status: Active Ulcer/skin breakdown will have a volume reduction of 50% by week 8 Date Initiated: 05/07/2015 Goal Status: Active Ulcer/skin breakdown will have a volume reduction of 80% by week 12 Date Initiated: 05/07/2015 Goal Status: Active Interventions: Assess patient/caregiver ability to obtain necessary supplies Assess patient/caregiver ability to perform ulcer/skin care regimen upon admission and as needed Notes: Electronic Signature(s) Signed: 05/14/2015 1:07:56 PM By: Regan Lemming BSN, RN Entered By: Regan Lemming on 05/14/2015 13:07:56 Leone Payor ([id number]) -------------------------------------------------------------------------------- Pain Assessment Details Patient Name: Leone Payor Date of Service: 05/14/2015 12:45 PM Medical Record Number: [id number] Patient Account Number: [id number] Date of Birth/Sex: 05/24/1941 (  74 y.o. Female) Treating RN: Baruch Gouty, RN, BSN, Velva Harman Primary Care Physician: Emily Filbert Other Clinician: Referring Physician: Emily Filbert Treating Physician/Extender: BURNS III, Charlean Sanfilippo in Treatment: 1 Active Problems Location of Pain  Severity and Description of Pain Patient Has Paino No Site Locations Pain Management and Medication Current Pain Management: Electronic Signature(s) Signed: 05/14/2015 12:55:07 PM By: Regan Lemming BSN, RN Entered By: Regan Lemming on 05/14/2015 12:55:06 Leone Payor ([id number]) -------------------------------------------------------------------------------- Patient/Caregiver Education Details Patient Name: Leone Payor Date of Service: 05/14/2015 12:45 PM Medical Record Number: [id number] Patient Account Number: [id number] Date of Birth/Gender: [birth year] (74 y.o. Female) Treating RN: Baruch Gouty, RN, BSN, Velva Harman Primary Care Physician: Emily Filbert Other Clinician: Referring Physician: Emily Filbert Treating Physician/Extender: BURNS III, Charlean Sanfilippo in Treatment: 1 Education Assessment Education Provided To: Patient Education Topics Provided Nutrition: Methods: Explain/Verbal Responses: State content correctly Welcome To The [place]: Methods: Explain/Verbal Responses: State content correctly Electronic Signature(s) Signed: 05/14/2015 1:23:35 PM By: Regan Lemming BSN, RN Entered By: Regan Lemming on 05/14/2015 13:23:35 Leone Payor ([id number]) -------------------------------------------------------------------------------- Wound Assessment Details Patient Name: Leone Payor Date of Service: 05/14/2015 12:45 PM Medical Record Number: [id number] Patient Account Number: [id number] Date of Birth/Sex: Dec 25, 1940 (74 y.o. Female) Treating RN: Baruch Gouty, RN, BSN, Velva Harman Primary Care Physician: Emily Filbert Other Clinician: Referring Physician: Emily Filbert Treating Physician/Extender: BURNS III, Charlean Sanfilippo in Treatment: 1 Wound Status Wound Number: 1 Primary Etiology: To be determined Wound Location: Right Hand - 4th Digit - Wound Status: Healed - Epithelialized Posterior Comorbid History: Cataracts, Rheumatoid Arthritis Wounding Event: Gradually Appeared Date  Acquired: 04/06/2015 Weeks Of Treatment: 1 Clustered Wound: No Photos Photo Uploaded By: Regan Lemming on 05/14/2015 14:10:54 Wound Measurements Length: (cm) 0 % Reduction Width: (cm) 0 % Reduction Depth: (cm) 0 Epithelializ Area: (cm) 0 Tunneling: Volume: (cm) 0 Undermining in Area: 100% in Volume: 100% ation: None No : No Wound Description Classification: Partial Thickness Wound Margin: Flat and Intact Exudate Amount: None Present Foul Odor After Cleansing: No Wound Bed Granulation Amount: Medium (34-66%) Exposed Structure Granulation Quality: Pink Fascia Exposed: No Necrotic Amount: None Present (0%) Fat Layer Exposed: No Tendon Exposed: No Muscle Exposed: No DAGNEY, SAVASTANO ([id number]) Joint Exposed: No Bone Exposed: No Limited to Skin Breakdown Periwound Skin Texture Texture Color No Abnormalities Noted: No No Abnormalities Noted: Yes Scarring: Yes Temperature / Pain Moisture Temperature: No Abnormality No Abnormalities Noted: No Dry / Scaly: Yes Wound Preparation Ulcer Cleansing: Rinsed/Irrigated with Saline Topical Anesthetic Applied: None Electronic Signature(s) Signed: 05/14/2015 1:11:20 PM By: Regan Lemming BSN, RN Previous Signature: 05/14/2015 1:01:57 PM Version By: Regan Lemming BSN, RN Entered By: Regan Lemming on 05/14/2015 13:11:19 Leone Payor ([id number]) -------------------------------------------------------------------------------- Wound Assessment Details Patient Name: Leone Payor Date of Service: 05/14/2015 12:45 PM Medical Record Number: [id number] Patient Account Number: [id number] Date of Birth/Sex: January 05, 1941 (74 y.o. Female) Treating RN: Baruch Gouty, RN, BSN, Velva Harman Primary Care Physician: Emily Filbert Other Clinician: Referring Physician: Emily Filbert Treating Physician/Extender: BURNS III, Charlean Sanfilippo in Treatment: 1 Wound Status Wound Number: 2 Primary Etiology: To be determined Wound Location: Left Lower Leg -  Anterior Wound Status: Open Wounding Event: Surgical Injury Comorbid History: Cataracts, Rheumatoid Arthritis Date Acquired: 04/06/2015 Weeks Of Treatment: 1 Clustered Wound: No Photos Photo Uploaded By: Regan Lemming on 05/14/2015 14:11:33 Wound Measurements Length: (cm) 0.4 Width: (cm) 0.4 Depth: (cm) 0.3 Area: (cm) 0.126 Volume: (cm) 0.038 % Reduction in Area: 35.7% % Reduction in Volume: 35.6% Epithelialization: None Tunneling: No Undermining: No Wound  Description Classification: Partial Thickness Wound Margin: Flat and Intact Exudate Amount: Small Exudate Type: Serous Exudate Color: amber Foul Odor After Cleansing: No Wound Bed Granulation Amount: None Present (0%) Exposed Structure Necrotic Amount: Large (67-100%) Fascia Exposed: No Necrotic Quality: Adherent Slough Fat Layer Exposed: No Tendon Exposed: No LISSETE, ROOKER ([id number]) Muscle Exposed: No Joint Exposed: No Bone Exposed: No Limited to Skin Breakdown Periwound Skin Texture Texture Color No Abnormalities Noted: Yes No Abnormalities Noted: No Moisture Temperature / Pain No Abnormalities Noted: No Temperature: No Abnormality Dry / Scaly: No Moist: Yes Wound Preparation Ulcer Cleansing: Rinsed/Irrigated with Saline Topical Anesthetic Applied: Other: lidocaine 4%, Treatment Notes Wound #2 (Left, Anterior Lower Leg) 1. Cleansed with: Clean wound with Normal Saline 4. Dressing Applied: Promogran 5. Secondary Dressing Applied Bordered Foam Dressing 7. Secured with Financial risk analyst) Signed: 05/14/2015 1:02:38 PM By: Regan Lemming BSN, RN Entered By: Regan Lemming on 05/14/2015 13:02:38 Leone Payor ([id number]) -------------------------------------------------------------------------------- Vitals Details Patient Name: Leone Payor Date of Service: 05/14/2015 12:45 PM Medical Record Number: [id number] Patient Account Number: [id number] Date of Birth/Sex: [birth year]  (74 y.o. Female) Treating RN: Afful, RN, BSN, [place] Primary Care Physician: Emily Filbert Other Clinician: Referring Physician: Emily Filbert Treating Physician/Extender: BURNS III, Charlean Sanfilippo in Treatment: 1 Vital Signs Time Taken: 12:55 Temperature (F): 97.5 Height (in): 64 Pulse (bpm): 79 Weight (lbs): 88 Respiratory Rate (breaths/min): 18 Body Mass Index (BMI): 15.1 Blood Pressure (mmHg): 123/70 Reference Range: 80 - 120 mg / dl Electronic Signature(s) Signed: 05/14/2015 12:57:35 PM By: Regan Lemming BSN, RN Entered By: Regan Lemming on 05/14/2015 12:57:35

## 2015-05-15 NOTE — Progress Notes (Signed)
YENA, DEETS ([id number]) Visit Report for 05/14/2015 Chief Complaint Document Details Patient Name: [name], [name] Date of Service: 05/14/2015 12:45 PM Medical Record Patient Account Number: [id number] Number: Treating RN: Baruch Gouty, RN, BSN, Rita 10-05-1940 [phone] y.o. Other Clinician: Date of Birth/Sex: Female) Treating BURNS III, Primary Care Physician: Emily Filbert Physician/Extender: Thayer Jew Referring Physician: Melina Modena in Treatment: 1 Information Obtained from: Patient Chief Complaint Left calf and right finger ulceration Electronic Signature(s) Signed: 05/14/2015 2:46:32 PM By: Loletha Grayer MD Entered By: Loletha Grayer on 05/14/2015 13:16:55 Leone Payor ([id number]) -------------------------------------------------------------------------------- Debridement Details Patient Name: Leone Payor Date of Service: 05/14/2015 12:45 PM Medical Record Patient Account Number: [id number] Number: Treating RN: Baruch Gouty, RN, BSN, Rita Oct 27, 1940 [phone] y.o. Other Clinician: Date of Birth/Sex: Female) Treating BURNS III, Primary Care Physician: Emily Filbert Physician/Extender: Thayer Jew Referring Physician: Melina Modena in Treatment: 1 Debridement Performed for Wound #2 Left,Anterior Lower Leg Assessment: Performed By: Physician BURNS III, Teressa Senter., MD Debridement: Debridement Pre-procedure Yes Verification/Time Out Taken: Start Time: 13:07 Pain Control: Lidocaine 4% Topical Solution Level: Skin/Subcutaneous Tissue Total Area Debrided (L x 0.4 (cm) x 0.4 (cm) = 0.16 (cm) W): Tissue and other Viable, Non-Viable, Fat, Fibrin/Slough, Subcutaneous material debrided: Instrument: Curette Bleeding: Minimum Hemostasis Achieved: Pressure End Time: 13:10 Procedural Pain: 0 Post Procedural Pain: 0 Response to Treatment: Procedure was tolerated well Post Debridement Measurements of Total Wound Length: (cm) 0.5 Width: (cm)  0.5 Depth: (cm) 0.3 Volume: (cm) 0.059 Post Procedure Diagnosis Same as Pre-procedure Electronic Signature(s) Signed: 05/14/2015 2:11:52 PM By: Regan Lemming BSN, RN Signed: 05/14/2015 2:46:32 PM By: Loletha Grayer MD Previous Signature: 05/14/2015 1:10:23 PM Version By: Regan Lemming BSN, RN Entered By: Loletha Grayer on 05/14/2015 13:16:47 Leone Payor ([id number] Payor ([id number]) -------------------------------------------------------------------------------- HPI Details Patient Name: Leone Payor Date of Service: 05/14/2015 12:45 PM Medical Record Patient Account Number: [id number] Number: Treating RN: Baruch Gouty, RN, BSN, Rita [birth year] [id number] y.o. Other Clinician: Date of Birth/Sex: Female) Treating BURNS III, Primary Care Physician: Emily Filbert Physician/Extender: Thayer Jew Referring Physician: Melina Modena in Treatment: 1 History of Present Illness HPI Description: Pleasant 74 year old with history of rheumatoid arthritis, smoking, and COPD. No history of diabetes or PAD. Left ABI 1.2. 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 Pathology reportedly showed clear margins. The wound was closed primarily but dehisced. She developed localized edema and was treated with Unna boots without significant improvement. Completed a course of doxycycline. Performing dressing changes with Promogran Prisma. Using a Tubigrip for edema control. Cannot apply compression stockings secondary to weakness in her hands. She returns to clinic for follow-up and complains of stinging with the Promogran Prisma. She denies any significant pain. No fever or chills. No significant drainage. Electronic Signature(s) Signed: 05/14/2015 2:46:32 PM By: Loletha Grayer MD Entered By: Loletha Grayer on 05/14/2015 13:18:25 Leone Payor  ([id number]) -------------------------------------------------------------------------------- Physical Exam Details Patient Name: Leone Payor Date of Service: 05/14/2015 12:45 PM Medical Record Patient Account Number: [id number] Number: Treating RN: Baruch Gouty, RN, BSN, Rita 21-Nov-1940 [phone] y.o. Other Clinician: Date of Birth/Sex: Female) Treating BURNS III, Primary Care Physician: Emily Filbert Physician/Extender: Thayer Jew Referring Physician: Melina Modena in Treatment: 1 Constitutional . Pulse regular. Respirations normal and unlabored. Afebrile. . Notes Left anterior calf ulceration. Full thickness. No exposed deep structures. No evidence for infection. Minimal edema. Palpable DP. L ABI 1.2. Raised hypertrophic callus on right ring finger. No  drainage or infection. Electronic Signature(s) Signed: 05/14/2015 2:46:32 PM By: Loletha Grayer MD Entered By: Loletha Grayer on 05/14/2015 13:18:52 Leone Payor ([id number]) -------------------------------------------------------------------------------- Physician Orders Details Patient Name: Leone Payor Date of Service: 05/14/2015 12:45 PM Medical Record Patient Account Number: [id number] Number: Treating RN: Baruch Gouty, RN, BSN, Rita Oct 15, 1940 [phone] y.o. Other Clinician: Date of Birth/Sex: Female) Treating BURNS III, Primary Care Physician: Emily Filbert Physician/Extender: Thayer Jew Referring Physician: Melina Modena in Treatment: 1 Verbal / Phone Orders: Yes Clinician: Afful, RN, BSN, Rita Read Back and Verified: Yes Diagnosis Coding Wound Cleansing Wound #2 Left,Anterior Lower Leg o Clean wound with Normal Saline. Primary Wound Dressing Wound #2 Left,Anterior Lower Leg o Promogran Secondary Dressing Wound #2 Left,Anterior Lower Leg o Gauze and Kerlix/Conform Dressing Change Frequency Wound #2 Left,Anterior Lower Leg o Change dressing every other day. Follow-up  Appointments Wound #2 Left,Anterior Lower Leg o Return Appointment in 1 week. Edema Control Wound #2 Left,Anterior Lower Leg o Tubigrip - left leg Electronic Signature(s) Signed: 05/14/2015 1:12:18 PM By: Regan Lemming BSN, RN Signed: 05/14/2015 2:46:32 PM By: Loletha Grayer MD Entered By: Regan Lemming on 05/14/2015 13:12:18 Leone Payor ([id number]) -------------------------------------------------------------------------------- Problem List Details Patient Name: Leone Payor Date of Service: 05/14/2015 12:45 PM Medical Record Patient Account Number: [id number] Number: Treating RN: Baruch Gouty, RN, BSN, Rita 06-17-1940 [phone] y.o. 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 T81.31XA Disruption of external operation (surgical) wound, not 05/07/2015 Yes elsewhere classified, initial encounter M06.9 Rheumatoid arthritis, unspecified 05/07/2015 Yes J44.9 Chronic obstructive pulmonary disease, unspecified 05/07/2015 Yes L02.521 Furuncle right hand 05/07/2015 Yes Inactive Problems Resolved Problems Electronic Signature(s) Signed: 05/14/2015 2:46:32 PM By: Loletha Grayer MD Entered By: Loletha Grayer on 05/14/2015 13:16:21 Leone Payor ([id number]) -------------------------------------------------------------------------------- Progress Note Details Patient Name: Leone Payor Date of Service: 05/14/2015 12:45 PM Medical Record Patient Account Number: [id number] Number: Treating RN: Baruch Gouty, RN, BSN, Rita April 05, 1941 [phone] y.o. Other Clinician: Date of Birth/Sex: Female) Treating BURNS III, Primary Care Physician: Emily Filbert Physician/Extender: Thayer Jew Referring Physician: Melina Modena in Treatment: 1 Subjective Chief Complaint Information obtained from Patient Left calf and right finger ulceration History of Present Illness (HPI) Pleasant 74 year old with history of rheumatoid arthritis, smoking, and COPD. No history of diabetes or PAD. Left ABI 1.2. 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 Pathology reportedly showed clear margins. The wound was closed primarily but dehisced. She developed localized edema and was treated with Unna boots without significant improvement. Completed a course of doxycycline. Performing dressing changes with Promogran Prisma. Using a Tubigrip for edema control. Cannot apply compression stockings secondary to weakness in her hands. She returns to clinic for follow-up and complains of stinging with the Promogran Prisma. She denies any significant pain. No fever or chills. No significant drainage. Objective Constitutional Pulse regular. Respirations normal and unlabored. Afebrile. Vitals Time Taken: 12:55 PM, Height: 64 in, Weight: 88 lbs, BMI: 15.1, Temperature: 97.5 F, Pulse: 79 bpm, Respiratory Rate: 18 breaths/min, Blood Pressure: 123/70 mmHg. General Notes: Left anterior calf ulceration. Full thickness. No exposed deep structures. No evidence for infection. Minimal edema. Palpable DP. L ABI 1.2. Raised hypertrophic callus on right ring finger. No drainage or infection. Integumentary (Hair, Skin)  BRYLEIGH, BAUWENS ([id number]) Wound #1 status is Healed - Epithelialized. Original cause of wound was Gradually Appeared. The wound is located on the Right,Posterior Hand - 4th Digit. The wound measures 0cm length x 0cm width x 0cm depth; 0cm^2 area and 0cm^3 volume. The wound is limited to skin breakdown. There is no tunneling or undermining noted. There is a none present amount of drainage noted. The wound margin is flat and  intact. There is medium (34-66%) pink granulation within the wound bed. There is no necrotic tissue within the wound bed. The periwound skin appearance had no abnormalities noted for color. The periwound skin appearance exhibited: Scarring, Dry/Scaly. Periwound temperature was noted as No Abnormality. Wound #2 status is Open. Original cause of wound was Surgical Injury. The wound is located on the Left,Anterior Lower Leg. The wound measures 0.4cm length x 0.4cm width x 0.3cm depth; 0.126cm^2 area and 0.038cm^3 volume. The wound is limited to skin breakdown. There is no tunneling or undermining noted. There is a small amount of serous drainage noted. The wound margin is flat and intact. There is no granulation within the wound bed. There is a large (67-100%) amount of necrotic tissue within the wound bed including Adherent Slough. The periwound skin appearance had no abnormalities noted for texture. The periwound skin appearance exhibited: Moist. The periwound skin appearance did not exhibit: Dry/Scaly. Periwound temperature was noted as No Abnormality. Assessment Active Problems ICD-10 [id number] - Non-pressure chronic ulcer of left calf with fat layer exposed T81.31XA - Disruption of external operation (surgical) wound, not elsewhere classified, initial encounter M06.9 - Rheumatoid arthritis, unspecified J44.9 - Chronic obstructive pulmonary disease, unspecified L02.521 - Furuncle right hand Left calf ulceration status post excision of squamous cell carcinoma. Procedures Wound #2 Wound #2 is a To be determined located on the Left,Anterior Lower Leg . There was a Skin/Subcutaneous Tissue Debridement [id number]) debridement with total area of 0.16 sq cm performed by BURNS III, Teressa Senter., MD. with the following instrument(s): Curette to remove Viable and Non-Viable tissue/material including Fat, Fibrin/Slough, and Subcutaneous after achieving pain control using Lidocaine 4% Topical Solution.  A time out was conducted prior to the start of the procedure. A Minimum amount of bleeding was controlled with Pressure. The procedure was tolerated well with a pain level of 0 throughout and a pain level of 0 following the procedure. Post Debridement Measurements: 0.5cm length x 0.5cm width x 0.3cm depth; 0.059cm^3 volume. ADIYAH, WADLER ([id number]) Post procedure Diagnosis Wound #2: Same as Pre-Procedure Plan Wound Cleansing: Wound #2 Left,Anterior Lower Leg: Clean wound with Normal Saline. Primary Wound Dressing: Wound #2 Left,Anterior Lower Leg: Promogran Secondary Dressing: Wound #2 Left,Anterior Lower Leg: Gauze and Kerlix/Conform Dressing Change Frequency: Wound #2 Left,Anterior Lower Leg: Change dressing every other day. Follow-up Appointments: Wound #2 Left,Anterior Lower Leg: Return Appointment in 1 week. Edema Control: Wound #2 Left,Anterior Lower Leg: Tubigrip - left leg Switch to Promogran dressing changes. Tubigrip for edema control. If no significant improvement we'll consider punch biopsy and skin substitutes. Electronic Signature(s) Signed: 05/14/2015 2:46:32 PM By: Loletha Grayer MD Entered By: Loletha Grayer on 05/14/2015 13:19:31 Leone Payor ([id number]) -------------------------------------------------------------------------------- SuperBill Details Patient Name: Leone Payor Date of Service: 05/14/2015 Medical Record Patient Account Number: [id number] Number: Treating RN: Baruch Gouty, RN, BSN, Rita 10/21/1940 [phone] y.o. Other Clinician: Date of Birth/Sex: Female) Treating BURNS III, Primary Care Physician: Emily Filbert Physician/Extender: Thayer Jew Referring Physician: Melina Modena in Treatment: 1 Diagnosis Coding ICD-10 Codes Code Description [phone]  Non-pressure chronic ulcer of left calf with fat layer exposed Disruption of external operation (surgical) wound, not elsewhere classified,  initial T81.31XA encounter M06.9 Rheumatoid arthritis, unspecified J44.9 Chronic obstructive pulmonary disease, unspecified L02.521 Furuncle right hand Facility Procedures CPT4 Code: [id number] Description: [id number] - DEB SUBQ TISSUE 20 SQ CM/< ICD-10 Description Diagnosis L97.222 Non-pressure chronic ulcer of left calf with fat la Modifier: yer exposed Quantity: 1 Physician Procedures CPT4 Code: [id number] Description: [id number] - WC PHYS SUBQ TISS 20 SQ CM ICD-10 Description Diagnosis L97.222 Non-pressure chronic ulcer of left calf with fat la Modifier: yer exposed Quantity: 1 Electronic Signature(s) Signed: 05/14/2015 2:46:32 PM By: Loletha Grayer MD Entered By: Loletha Grayer on 05/14/2015 13:19:43

## 2015-05-21 ENCOUNTER — Encounter: Payer: Medicare Other | Admitting: Surgery

## 2015-05-21 DIAGNOSIS — L97222 Non-pressure chronic ulcer of left calf with fat layer exposed: Secondary | ICD-10-CM | POA: Diagnosis not present
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
## 2015-05-22 NOTE — Progress Notes (Signed)
RAYDENE, MORNEAU ([id number]) Visit Report for 05/21/2015 Chief Complaint Document Details Patient Name: [name], [name] Date of Service: 05/21/2015 2:15 PM Medical Record Patient Account Number: [id number] Number: Treating RN: Baruch Gouty, RN, BSN, Rita July 21, 1940 [phone] y.o. Other Clinician: Date of Birth/Sex: Female) Treating BURNS III, Primary Care Physician: Emily Filbert Physician/Extender: Thayer Jew Referring Physician: Melina Modena in Treatment: 2 Information Obtained from: Patient Chief Complaint Left calf ulcerations. Electronic Signature(s) Signed: 05/21/2015 3:49:04 PM By: Loletha Grayer MD Entered By: Loletha Grayer on 05/21/2015 15:07:27 Leone Payor ([id number]) -------------------------------------------------------------------------------- Debridement Details Patient Name: Leone Payor Date of Service: 05/21/2015 2:15 PM Medical Record Patient Account Number: [id number] Number: Treating RN: Baruch Gouty, RN, BSN, Rita [birth year] [phone] y.o. Other Clinician: Date of Birth/Sex: Female) Treating BURNS III, Primary Care Physician: Emily Filbert Physician/Extender: Thayer Jew Referring Physician: Melina Modena in Treatment: 2 Debridement Performed for Wound #2 Left,Anterior Lower Leg Assessment: Performed By: Physician BURNS III, Teressa Senter., MD Debridement: Debridement Pre-procedure Yes Verification/Time Out Taken: Start Time: 14:38 Pain Control: Lidocaine 4% Topical Solution Level: Skin/Subcutaneous Tissue Total Area Debrided (L x 0.5 (cm) x 0.5 (cm) = 0.25 (cm) W): Tissue and other Viable, Non-Viable, Fat, Fibrin/Slough, Subcutaneous material debrided: Instrument: Curette Bleeding: Minimum Hemostasis Achieved: Pressure End Time: 14:42 Procedural Pain: 0 Post Procedural Pain: 0 Response to Treatment: Procedure was tolerated well Post Debridement Measurements of Total Wound Length: (cm) 0.8 Width: (cm) 0.8 Depth: (cm)  0.5 Volume: (cm) 0.251 Post Procedure Diagnosis Same as Pre-procedure Electronic Signature(s) Signed: 05/21/2015 3:49:04 PM By: Loletha Grayer MD Signed: 05/21/2015 5:16:39 PM By: Regan Lemming BSN, RN Entered By: Loletha Grayer on 05/21/2015 15:07:08 Leone Payor ([id number]) -------------------------------------------------------------------------------- HPI Details Patient Name: Leone Payor Date of Service: 05/21/2015 2:15 PM Medical Record Patient Account Number: [id number] Number: Treating RN: Baruch Gouty, RN, BSN, Rita Mar 12, 1941 (74 y.o. Other Clinician: Date of Birth/Sex: Female) Treating BURNS III, Primary Care Physician: Emily Filbert Physician/Extender: Thayer Jew Referring Physician: Melina Modena in Treatment: 2 History of Present Illness HPI Description: Pleasant 74 year old with history of rheumatoid arthritis, smoking, and COPD. No history of diabetes or PAD. Left ABI 1.2. 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 Pathology reportedly showed clear margins. Records requested. No path report in Epic. The wound was closed primarily but dehisced. She developed localized edema and was treated with Unna boots without significant improvement. Completed a course of doxycycline. Performing dressing changes with Promogran Prisma. Using a Tubigrip for edema control. Cannot apply compression stockings secondary to weakness in her hands. She returns to clinic for follow-up and is without new complaints. She denies any significant pain. No fever or chills. No significant drainage. Electronic Signature(s) Signed: 05/21/2015 3:49:04 PM By: Loletha Grayer MD Entered By: Loletha Grayer on 05/21/2015 15:08:22 Leone Payor ([id number]) -------------------------------------------------------------------------------- Physical Exam Details Patient Name: Leone Payor Date of Service: 05/21/2015 2:15  PM Medical Record Patient Account Number: [id number] Number: Treating RN: Baruch Gouty, RN, BSN, Rita 27-Sep-1940 [phone] y.o. Other Clinician: Date of Birth/Sex: Female) Treating BURNS III, Primary Care Physician: Emily Filbert Physician/Extender: Thayer Jew Referring Physician: Melina Modena in Treatment: 2 Constitutional . Pulse regular. Respirations normal and unlabored. Afebrile. . Notes Left anterior calf ulceration. Full thickness. No exposed deep structures. No evidence for infection. Minimal edema. Palpable DP. L ABI 1.2. Raised hypertrophic callus on right ring finger. No drainage or infection. Electronic Signature(s) Signed: 05/21/2015 3:49:04 PM By: Loletha Grayer MD  Entered By: Loletha Grayer on 05/21/2015 15:08:50 Leone Payor ([id number]) -------------------------------------------------------------------------------- Physician Orders Details Patient Name: Leone Payor Date of Service: 05/21/2015 2:15 PM Medical Record Patient Account Number: [id number] Number: Treating RN: Baruch Gouty, RN, BSN, Velva Harman January 09, 1941 [phone] y.o. Other Clinician: Date of Birth/Sex: Female) Treating BURNS III, Primary Care Physician: Emily Filbert Physician/Extender: Thayer Jew Referring Physician: Melina Modena in Treatment: 2 Verbal / Phone Orders: Yes Clinician: Afful, RN, BSN, Rita Read Back and Verified: Yes Diagnosis Coding Wound Cleansing Wound #2 Left,Anterior Lower Leg o Clean wound with Normal Saline. Primary Wound Dressing Wound #2 Left,Anterior Lower Leg o Promogran Secondary Dressing Wound #2 Left,Anterior Lower Leg o Gauze and Kerlix/Conform Dressing Change Frequency Wound #2 Left,Anterior Lower Leg o Change dressing every other day. Follow-up Appointments Wound #2 Left,Anterior Lower Leg o Return Appointment in 1 week. Edema Control Wound #2 Left,Anterior Lower Leg o Tubigrip - left leg Consults o Plastic Surgery - for  excisement and close up oooo MARQUETA, MONNETT ([id number]) Electronic Signature(s) Signed: 05/21/2015 3:49:04 PM By: Loletha Grayer MD Signed: 05/21/2015 5:16:39 PM By: Regan Lemming BSN, RN Entered By: Regan Lemming on 05/21/2015 14:43:36 Leone Payor ([id number]) -------------------------------------------------------------------------------- Problem List Details Patient Name: Leone Payor Date of Service: 05/21/2015 2:15 PM Medical Record Patient Account Number: [id number] Number: Treating RN: Baruch Gouty, RN, BSN, Rita 10-17-1940 [phone] y.o. 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 T81.31XA Disruption of external operation (surgical) wound, not 05/07/2015 Yes elsewhere classified, initial encounter M06.9 Rheumatoid arthritis, unspecified 05/07/2015 Yes J44.9 Chronic obstructive pulmonary disease, unspecified 05/07/2015 Yes L02.521 Furuncle right hand 05/07/2015 Yes Inactive Problems Resolved Problems Electronic Signature(s) Signed: 05/21/2015 3:49:04 PM By: Loletha Grayer MD Entered By: Loletha Grayer on 05/21/2015 15:06:50 Leone Payor ([id number]) -------------------------------------------------------------------------------- Progress Note Details Patient Name: Leone Payor Date of Service: 05/21/2015 2:15 PM Medical Record Patient Account Number: [id number] Number: Treating RN: Baruch Gouty, RN, BSN, Rita 1940-08-31 [phone] y.o. Other Clinician: Date of Birth/Sex: Female) Treating BURNS III, Primary Care Physician: Emily Filbert Physician/Extender: Thayer Jew Referring Physician: Melina Modena in Treatment: 2 Subjective Chief  Complaint Information obtained from Patient Left calf ulcerations. History of Present Illness (HPI) Pleasant 74 year old with history of rheumatoid arthritis, smoking, and COPD. No history of diabetes or PAD. Left ABI 1.2. 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 Pathology reportedly showed clear margins. Records requested. No path report in Epic. The wound was closed primarily but dehisced. She developed localized edema and was treated with Unna boots without significant improvement. Completed a course of doxycycline. Performing dressing changes with Promogran Prisma. Using a Tubigrip for edema control. Cannot apply compression stockings secondary to weakness in her hands. She returns to clinic for follow-up and is without new complaints. She denies any significant pain. No fever or chills. No significant drainage. Objective Constitutional Pulse regular. Respirations normal and unlabored. Afebrile. Vitals Time Taken: 2:29 PM, Height: 64 in, Weight: 88 lbs, BMI: 15.1, Temperature: 97.6 F, Pulse: 83 bpm, Respiratory Rate: 18 breaths/min, Blood Pressure: 117/71 mmHg. General Notes: Left anterior calf ulceration. Full thickness. No exposed deep structures. No evidence for infection. Minimal edema. Palpable DP. L ABI 1.2. Raised hypertrophic callus on right ring finger. No drainage or infection. Integumentary (  Hair, Skin) EUREKA, WUOLLET. ([id number]) Wound #2 status is Open. Original cause of wound was Surgical Injury. The wound is located on the Left,Anterior Lower Leg. The wound measures 0.5cm length x 0.5cm width x 0.5cm depth; 0.196cm^2 area and 0.098cm^3 volume. The wound is limited to skin breakdown. There is no tunneling or undermining noted. There is a small amount of serous drainage noted. The wound margin is flat and intact. There is no granulation within the wound bed. There is a large (67-100%) amount of necrotic tissue within the wound bed  including Adherent Slough. The periwound skin appearance had no abnormalities noted for texture. The periwound skin appearance did not exhibit: Dry/Scaly, Maceration, Moist, Atrophie Blanche, Cyanosis, Ecchymosis, Hemosiderin Staining, Mottled, Pallor, Rubor, Erythema. Periwound temperature was noted as No Abnormality. Assessment Active Problems ICD-10 [id number] - Non-pressure chronic ulcer of left calf with fat layer exposed T81.31XA - Disruption of external operation (surgical) wound, not elsewhere classified, initial encounter M06.9 - Rheumatoid arthritis, unspecified J44.9 - Chronic obstructive pulmonary disease, unspecified L02.521 - Furuncle right hand Left calf ulceration, status post squamous cell carcinoma excision. Procedures Wound #2 Wound #2 is an Open Surgical Wound located on the Left,Anterior Lower Leg . There was a Skin/Subcutaneous Tissue Debridement [id number]) debridement with total area of 0.25 sq cm performed by BURNS III, Teressa Senter., MD. with the following instrument(s): Curette to remove Viable and Non-Viable tissue/material including Fat, Fibrin/Slough, and Subcutaneous after achieving pain control using Lidocaine 4% Topical Solution. A time out was conducted prior to the start of the procedure. A Minimum amount of bleeding was controlled with Pressure. The procedure was tolerated well with a pain level of 0 throughout and a pain level of 0 following the procedure. Post Debridement Measurements: 0.8cm length x 0.8cm width x 0.5cm depth; 0.251cm^3 volume. Post procedure Diagnosis Wound #2: Same as Pre-Procedure MERRIEL, KENDRICK ([id number]) Plan Wound Cleansing: Wound #2 Left,Anterior Lower Leg: Clean wound with Normal Saline. Primary Wound Dressing: Wound #2 Left,Anterior Lower Leg: Promogran Secondary Dressing: Wound #2 Left,Anterior Lower Leg: Gauze and Kerlix/Conform Dressing Change Frequency: Wound #2 Left,Anterior Lower Leg: Change dressing every  other day. Follow-up Appointments: Wound #2 Left,Anterior Lower Leg: Return Appointment in 1 week. Edema Control: Wound #2 Left,Anterior Lower Leg: Tubigrip - left leg Consults ordered were: Plastic Surgery - for excisement and close up Continue with Promogran dressing changes and Tubigrip for edema control. Obtain pathology report. Plastic surgery consultation for consideration of wide excision (recheck pathology) and primary closure. Electronic Signature(s) Signed: 05/21/2015 3:49:04 PM By: Loletha Grayer MD Entered By: Loletha Grayer on 05/21/2015 15:09:52 Leone Payor ([id number]) -------------------------------------------------------------------------------- SuperBill Details Patient Name: Leone Payor Date of Service: 05/21/2015 Medical Record Patient Account Number: [id number] Number: Treating RN: Baruch Gouty, RN, BSN, Rita 1940-10-19 [phone] y.o. Other Clinician: Date of Birth/Sex: Female) Treating BURNS III, Primary Care Physician: Emily Filbert Physician/Extender: Thayer Jew Referring Physician: Melina Modena in Treatment: 2 Diagnosis Coding ICD-10 Codes Code Description [phone] Non-pressure chronic ulcer of left calf with fat layer exposed Disruption of external operation (surgical) wound, not elsewhere classified, initial T81.31XA encounter M06.9 Rheumatoid arthritis, unspecified J44.9 Chronic obstructive pulmonary disease, unspecified L02.521 Furuncle right hand Facility Procedures CPT4 Code: [id number] Description: 11042 - DEB SUBQ TISSUE 20 SQ CM/< ICD-10 Description Diagnosis L97.222 Non-pressure chronic ulcer of left calf with fat la Modifier: yer exposed Quantity: 1 Physician Procedures CPT4 Code: [id number] Description: 11042 - WC PHYS SUBQ TISS 20 SQ CM ICD-10 Description Diagnosis L97.222 Non-pressure chronic ulcer  of left calf with fat la Modifier: yer exposed Quantity: 1 Electronic Signature(s) Signed: 05/21/2015 3:49:04 PM By:  Loletha Grayer MD Entered By: Loletha Grayer on 05/21/2015 15:10:03

## 2015-05-26 ENCOUNTER — Other Ambulatory Visit: Payer: Self-pay | Admitting: Internal Medicine

## 2015-05-26 DIAGNOSIS — Z1231 Encounter for screening mammogram for malignant neoplasm of breast: Secondary | ICD-10-CM

## 2015-05-27 DIAGNOSIS — S32000A Wedge compression fracture of unspecified lumbar vertebra, initial encounter for closed fracture: Secondary | ICD-10-CM | POA: Insufficient documentation

## 2015-05-28 ENCOUNTER — Encounter: Payer: Medicare Other | Admitting: Surgery

## 2015-05-28 DIAGNOSIS — L97222 Non-pressure chronic ulcer of left calf with fat layer exposed: Secondary | ICD-10-CM | POA: Diagnosis not present

## 2015-05-29 NOTE — Progress Notes (Signed)
BOBBIESUE, VERSTRAETE ([id number]) Visit Report for 05/28/2015 Chief Complaint Document Details Patient Name: [name], [name] Date of Service: 05/28/2015 2:15 PM Medical Record Patient Account Number: [id number] Number: Treating RN: Cornell Barman [birth year] (74 y.o. Other Clinician: Date of Birth/Sex: Female) Treating BURNS III, Primary Care Physician: Emily Filbert Physician/Extender: Thayer Jew Referring Physician: Melina Modena in Treatment: 3 Information Obtained from: Patient Chief Complaint Left calf ulceration. Electronic Signature(s) Signed: 05/28/2015 3:48:01 PM By: Loletha Grayer MD Entered By: Loletha Grayer on 05/28/2015 14:44:37 [name] ([id number]) -------------------------------------------------------------------------------- Debridement Details Patient Name: [name] Date of Service: 05/28/2015 2:15 PM Medical Record Patient Account Number: [id number] Number: Treating RN: Ahmed Prima [birth year] (74 y.o. Other Clinician: Date of Birth/Sex: Female) Treating BURNS III, Primary Care Physician: Emily Filbert Physician/Extender: Thayer Jew Referring Physician: Melina Modena in Treatment: 3 Debridement Performed for Wound #2 Left,Anterior Lower Leg Assessment: Performed By: Physician BURNS III, Teressa Senter., MD Debridement: Debridement Pre-procedure Yes Verification/Time Out Taken: Start Time: 14:39 Pain Control: Other : lidocaine 4% Level: Skin/Subcutaneous Tissue Total Area Debrided (L x 0.9 (cm) x 0.3 (cm) = 0.27 (cm) W): Tissue and other Viable, Non-Viable, Exudate, Fibrin/Slough, Subcutaneous material debrided: Instrument: Curette Bleeding: None End Time: 14:40 Procedural Pain: 0 Post Procedural Pain: 0 Response to Treatment: Procedure was tolerated well Post Debridement Measurements of Total Wound Length: (cm) 0.4 Width: (cm) 0.3 Depth: (cm) 0.3 Volume: (cm) 0.028 Post Procedure  Diagnosis Same as Pre-procedure Notes tunneling at 12 o'clock at 0.2cm Electronic Signature(s) Signed: 05/28/2015 3:48:01 PM By: Loletha Grayer MD Signed: 05/28/2015 4:19:08 PM By: Alric Quan Entered By: Alric Quan on 05/28/2015 14:47:31 [name] ([name] ([id number]) -------------------------------------------------------------------------------- HPI Details Patient Name: [name] Date of Service: 05/28/2015 2:15 PM Medical Record Patient Account Number: [id number] Number: Treating RN: Cornell Barman 04-Aug-1940 (74 y.o. Other Clinician: Date of Birth/Sex: Female) Treating BURNS III, Primary Care Physician: Emily Filbert Physician/Extender: Thayer Jew Referring Physician: Melina Modena in Treatment: 3 History of Present Illness HPI Description: Pleasant 74 year old with history of rheumatoid arthritis, smoking, and COPD. No history of diabetes or PAD. Left ABI 1.2. 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 Pathology showed positive margins. Underwent wide reexcision on 02/03/2015. Pathology reportedly showed no residual carcinoma. The wound was closed primarily but dehisced. She developed localized edema and was treated with Unna boots without significant improvement. Completed a course of doxycycline. Performing dressing changes with Promogran. Using a Tubigrip for edema control. Cannot apply compression stockings secondary to weakness in her hands. She returns to clinic for follow-up and is without new complaints. She denies any significant pain. No fever or chills. No significant drainage. Electronic Signature(s) Signed: 05/28/2015 3:48:01 PM By: Loletha Grayer MD Entered By: Loletha Grayer on 05/28/2015 14:45:54 [name] ([id number]) -------------------------------------------------------------------------------- Physical Exam Details Patient Name:  [name] Date of Service: 05/28/2015 2:15 PM Medical Record Patient Account Number: [id number] Number: Treating RN: Cornell Barman 1940-11-11 (74 y.o. Other Clinician: Date of Birth/Sex: Female) Treating BURNS III, Primary Care Physician: Emily Filbert Physician/Extender: Thayer Jew Referring Physician: Melina Modena in Treatment: 3 Constitutional . Pulse regular. Respirations normal and unlabored. Afebrile. . Notes Left anterior calf ulceration much improved. Full thickness. No exposed deep structures. No evidence for infection. Minimal edema. Palpable DP. L ABI 1.2. Electronic Signature(s) Signed: 05/28/2015 3:48:01 PM By: Loletha Grayer MD Entered By: Loletha Grayer on 05/28/2015 14:46:25  [name], [name] ([id number]) -------------------------------------------------------------------------------- Physician Orders Details Patient Name: [name] Date of Service: 05/28/2015 2:15 PM Medical Record Patient Account Number: [id number] Number: Treating RN: Ahmed Prima 07/11/1940 (74 y.o. Other Clinician: Date of Birth/Sex: Female) Treating BURNS III, Primary Care Physician: Emily Filbert Physician/Extender: Thayer Jew Referring Physician: Melina Modena in Treatment: 3 Verbal / Phone Orders: Yes Clinician: Carolyne Fiscal, Debi Read Back and Verified: Yes Diagnosis Coding ICD-10 Coding Code Description [phone] Non-pressure chronic ulcer of left calf with fat layer exposed Disruption of external operation (surgical) wound, not elsewhere classified, initial T81.31XA encounter M06.9 Rheumatoid arthritis, unspecified J44.9 Chronic obstructive pulmonary disease, unspecified Wound Cleansing Wound #2 Left,Anterior Lower Leg o Clean wound with Normal Saline. Primary Wound Dressing Wound #2 Left,Anterior Lower Leg o Promogran Secondary Dressing Wound #2 Left,Anterior Lower Leg o Gauze and Kerlix/Conform Dressing Change  Frequency Wound #2 Left,Anterior Lower Leg o Change dressing every other day. Follow-up Appointments Wound #2 Left,Anterior Lower Leg o Return Appointment in 1 week. Edema Control Wound #2 Left,Anterior Lower Leg o Tubigrip - left leg [name], [name] ([id number]) Electronic Signature(s) Signed: 05/28/2015 3:48:01 PM By: Loletha Grayer MD Signed: 05/28/2015 4:19:08 PM By: Alric Quan Entered By: Alric Quan on 05/28/2015 14:54:39 [name] ([id number]) -------------------------------------------------------------------------------- Problem List Details Patient Name: [name] Date of Service: 05/28/2015 2:15 PM Medical Record Patient Account Number: [id number] Number: Treating RN: Cornell Barman 06-27-1940 (74 y.o. Other Clinician: Date of Birth/Sex: Female) Treating BURNS III, Primary Care Physician: Emily Filbert Physician/Extender: Thayer Jew Referring Physician: Melina Modena in Treatment: 3 Active Problems ICD-10 Encounter Code Description Active Date Diagnosis L97.222 Non-pressure chronic ulcer of left calf with fat layer 05/07/2015 Yes exposed T81.31XA Disruption of external operation (surgical) wound, not 05/07/2015 Yes elsewhere classified, initial encounter M06.9 Rheumatoid arthritis, unspecified 05/07/2015 Yes J44.9 Chronic obstructive pulmonary disease, unspecified 05/07/2015 Yes Inactive Problems Resolved Problems ICD-10 Code Description Active Date Resolved Date L02.521 Furuncle right hand 05/07/2015 05/07/2015 Electronic Signature(s) Signed: 05/28/2015 3:48:01 PM By: Loletha Grayer MD Entered By: Loletha Grayer on 05/28/2015 14:44:21 [name] ([id number]) -------------------------------------------------------------------------------- Progress Note Details Patient Name: [name] Date of Service: 05/28/2015 2:15 PM Medical Record Patient Account Number:  [id number] Number: Treating RN: Cornell Barman Feb 01, 1941 (74 y.o. Other Clinician: Date of Birth/Sex: Female) Treating BURNS III, Primary Care Physician: Emily Filbert Physician/Extender: Thayer Jew Referring Physician: Melina Modena in Treatment: 3 Subjective Chief Complaint Information obtained from Patient Left calf ulceration. History of Present Illness (HPI) Pleasant 75 year old with history of rheumatoid arthritis, smoking, and COPD. No history of diabetes or PAD. Left ABI 1.2. 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 Pathology showed positive margins. Underwent wide reexcision on 02/03/2015. Pathology reportedly showed no residual carcinoma. The wound was closed primarily but dehisced. She developed localized edema and was treated with Unna boots without significant improvement. Completed a course of doxycycline. Performing dressing changes with Promogran. Using a Tubigrip for edema control. Cannot apply compression stockings secondary to weakness in her hands. She returns to clinic for follow-up and is without new complaints. She denies any significant pain. No fever or chills. No significant drainage. Objective Constitutional Pulse regular. Respirations normal and unlabored. Afebrile. Vitals Time Taken: 2:24 PM, Height: 64 in, Weight: 88 lbs, BMI: 15.1, Temperature: 98.3 F, Pulse: 85 bpm, Respiratory Rate: 18 breaths/min, Blood Pressure: 121/82 mmHg. General Notes: Left anterior calf ulceration much improved. Full thickness. No exposed deep structures. No evidence  for infection. Minimal edema. Palpable DP. L ABI 1.2. Integumentary (Hair, Skin) [name], [name]. ([id number]) Wound #2 status is Open. Original cause of wound was Surgical Injury. The wound is located on the Left,Anterior Lower Leg. The wound measures 0.4cm length x 0.3cm width x 0.1cm depth; 0.094cm^2 area and 0.009cm^3 volume. The wound is limited to skin  breakdown. There is no tunneling or undermining noted. There is a none present amount of drainage noted. The wound margin is flat and intact. There is no granulation within the wound bed. There is a large (67-100%) amount of necrotic tissue within the wound bed including Adherent Slough. The periwound skin appearance had no abnormalities noted for texture. The periwound skin appearance did not exhibit: Dry/Scaly, Maceration, Moist, Atrophie Blanche, Cyanosis, Ecchymosis, Hemosiderin Staining, Mottled, Pallor, Rubor, Erythema. Periwound temperature was noted as No Abnormality. The periwound has tenderness on palpation. Assessment Active Problems ICD-10 [id number] - Non-pressure chronic ulcer of left calf with fat layer exposed T81.31XA - Disruption of external operation (surgical) wound, not elsewhere classified, initial encounter M06.9 - Rheumatoid arthritis, unspecified J44.9 - Chronic obstructive pulmonary disease, unspecified Left calf ulceration, status post squamous cell carcinoma excision. Procedures Wound #2 Wound #2 is an Open Surgical Wound located on the Left,Anterior Lower Leg . There was a Skin/Subcutaneous Tissue Debridement [id number]) debridement with total area of 0.27 sq cm performed by BURNS III, Teressa Senter., MD. with the following instrument(s): Curette to remove Viable and Non-Viable tissue/material including Exudate, Fibrin/Slough, and Subcutaneous after achieving pain control using Other (lidocaine 4%). A time out was conducted prior to the start of the procedure. There was no bleeding. The procedure was tolerated well with a pain level of 0 throughout and a pain level of 0 following the procedure. Post Debridement Measurements: 0.4cm length x 0.3cm width x 0.3cm depth; 0.028cm^3 volume. Post procedure Diagnosis Wound #2: Same as Pre-Procedure Plan [name], [name] ([id number]) Continue withPromogran dressing changes and Tubigrip for edema control. If no significant  improvement will proceed with plastics consultation for consideration of wide reexcision and primary closure Electronic Signature(s) Signed: 05/28/2015 3:48:01 PM By: Loletha Grayer MD Entered By: Loletha Grayer on 05/28/2015 14:48:19 [name] ([id number]) -------------------------------------------------------------------------------- SuperBill Details Patient Name: [name] Date of Service: 05/28/2015 Medical Record Patient Account Number: [id number] Number: Treating RN: Cornell Barman 05/30/1941 (74 y.o. Other Clinician: Date of Birth/Sex: Female) Treating BURNS III, Primary Care Physician: Emily Filbert Physician/Extender: Thayer Jew Referring Physician: Melina Modena in Treatment: 3 Diagnosis Coding ICD-10 Codes Code Description [id number] Non-pressure chronic ulcer of left calf with fat layer exposed Disruption of external operation (surgical) wound, not elsewhere classified, initial T81.31XA encounter M06.9 Rheumatoid arthritis, unspecified J44.9 Chronic obstructive pulmonary disease, unspecified Facility Procedures CPT4 Code: [id number] Description: [id number] - DEB SUBQ TISSUE 20 SQ CM/< ICD-10 Description Diagnosis L97.222 Non-pressure chronic ulcer of left calf with fat la Modifier: yer exposed Quantity: 1 Physician Procedures CPT4 Code: [id number] Description: 11042 - WC PHYS SUBQ TISS 20 SQ CM ICD-10 Description Diagnosis L97.222 Non-pressure chronic ulcer of left calf with fat la Modifier: yer exposed Quantity: 1 Electronic Signature(s) Signed: 05/28/2015 3:48:01 PM By: Loletha Grayer MD Entered By: Loletha Grayer on 05/28/2015 14:48:29

## 2015-05-29 NOTE — Progress Notes (Signed)
GREGORY, DEFORREST ([id number]) Visit Report for 05/28/2015 Arrival Information Details Patient Name: [name], [name] Date of Service: 05/28/2015 2:15 PM Medical Record Number: [id number] Patient Account Number: [id number] Date of Birth/Sex: 1940-12-19 (74 y.o. Female) Treating RN: Ahmed Prima Primary Care Physician: Emily Filbert Other Clinician: Referring Physician: Emily Filbert Treating Physician/Extender: BURNS III, Charlean Sanfilippo in Treatment: 3 Visit Information History Since Last Visit All ordered tests and consults were completed: No Patient Arrived: Ambulatory Added or deleted any medications: No Arrival Time: 14:20 Any new allergies or adverse reactions: No Accompanied By: self Had a fall or experienced change in No Transfer Assistance: None activities of daily living that may affect Patient Identification Verified: Yes risk of falls: Secondary Verification Process Yes Signs or symptoms of abuse/neglect since last No Completed: visito Patient Requires Transmission-Based No Hospitalized since last visit: No Precautions: Pain Present Now: No Patient Has Alerts: No Electronic Signature(s) Signed: 05/28/2015 4:19:08 PM By: Alric Quan Entered By: Alric Quan on 05/28/2015 14:24:03 [name] ([id number]) -------------------------------------------------------------------------------- Encounter Discharge Information Details Patient Name: [name] Date of Service: 05/28/2015 2:15 PM Medical Record Number: [id number] Patient Account Number: [id number] Date of Birth/Sex: [birth year] (74 y.o. Female) Treating RN: Cornell Barman Primary Care Physician: Emily Filbert Other Clinician: Referring Physician: Emily Filbert Treating Physician/Extender: BURNS III, Charlean Sanfilippo in Treatment: 3 Encounter Discharge Information Items Discharge Pain Level: 0 Discharge Condition: Stable Ambulatory Status: Ambulatory Discharge Destination:  Home Transportation: Private Auto Accompanied By: self Schedule Follow-up Appointment: Yes Medication Reconciliation completed and provided to Patient/Care Yes Grazia Taffe: Provided on Clinical Summary of Care: 05/28/2015 Form Type Recipient Paper Patient [place] Electronic Signature(s) Signed: 05/28/2015 4:19:08 PM By: Alric Quan Previous Signature: 05/28/2015 2:55:03 PM Version By: Ruthine Dose Entered By: Alric Quan on 05/28/2015 14:56:03 [name] ([id number]) -------------------------------------------------------------------------------- Lower Extremity Assessment Details Patient Name: [name] Date of Service: 05/28/2015 2:15 PM Medical Record Number: [id number] Patient Account Number: [id number] Date of Birth/Sex: Oct 28, 1940 (74 y.o. Female) Treating RN: Ahmed Prima Primary Care Physician: Emily Filbert Other Clinician: Referring Physician: Emily Filbert Treating Physician/Extender: BURNS III, Charlean Sanfilippo in Treatment: 3 Edema Assessment Assessed: [Left: No] [Right: No] Edema: [Left: Ye] [Right: s] Calf Left: Right: Point of Measurement: cm From Medial Instep 27.5 cm cm Ankle Left: Right: Point of Measurement: cm From Medial Instep 17.2 cm cm Vascular Assessment Pulses: Posterior Tibial Dorsalis Pedis Palpable: [Left:Yes] Extremity colors, hair growth, and conditions: Extremity Color: [Left:Normal] Hair Growth on Extremity: [Left:Yes] Temperature of Extremity: [Left:Warm] Capillary Refill: [Left:< 3 seconds] Toe Nail Assessment Left: Right: Thick: No Discolored: No Deformed: No Improper Length and Hygiene: No Electronic Signature(s) Signed: 05/28/2015 4:19:08 PM By: Alric Quan Entered By: Alric Quan on 05/28/2015 14:29:13 [name] ([id number]) -------------------------------------------------------------------------------- Multi Wound Chart Details Patient Name: [name] Date of Service:  05/28/2015 2:15 PM Medical Record Number: [id number] Patient Account Number: [id number] Date of Birth/Sex: Jan 19, 1941 (74 y.o. Female) Treating RN: Ahmed Prima Primary Care Physician: Emily Filbert Other Clinician: Referring Physician: Emily Filbert Treating Physician/Extender: BURNS III, Charlean Sanfilippo in Treatment: 3 Vital Signs Height(in): 64 Pulse(bpm): 85 Weight(lbs): 88 Blood Pressure 121/82 (mmHg): Body Mass Index(BMI): 15 Temperature(F): 98.3 Respiratory Rate 18 (breaths/min): Photos: [2:No Photos] [N/A:N/A] Wound Location: [2:Left Lower Leg - Anterior] [N/A:N/A] Wounding Event: [2:Surgical Injury] [N/A:N/A] Primary Etiology: [2:Open Surgical Wound] [N/A:N/A] Comorbid History: [2:Cataracts, Rheumatoid Arthritis] [N/A:N/A] Date Acquired: [2:04/06/2015] [N/A:N/A] Weeks of Treatment: [2:3] [N/A:N/A] Wound Status: [2:Open] [N/A:N/A] Measurements L x W x D 0.9x0.3x0.1 [N/A:N/A] (cm) Area (cm) : [  2:0.212] [N/A:N/A] Volume (cm) : [2:0.021] [N/A:N/A] % Reduction in Area: [2:-8.20%] [N/A:N/A] % Reduction in Volume: 64.40% [N/A:N/A] Classification: [2:Partial Thickness] [N/A:N/A] Exudate Amount: [2:None Present] [N/A:N/A] Wound Margin: [2:Flat and Intact] [N/A:N/A] Granulation Amount: [2:None Present (0%)] [N/A:N/A] Necrotic Amount: [2:Large (67-100%)] [N/A:N/A] Exposed Structures: [2:Fascia: No Fat: No Tendon: No Muscle: No Joint: No Bone: No Limited to Skin Breakdown] [N/A:N/A] Epithelialization: [2:None] [N/A:N/A] Periwound Skin Texture: No Abnormalities Noted [N/A:N/A] Periwound Skin Maceration: No N/A N/A Moisture: Moist: No Dry/Scaly: No Periwound Skin Color: Atrophie Blanche: No N/A N/A Cyanosis: No Ecchymosis: No Erythema: No Hemosiderin Staining: No Mottled: No Pallor: No Rubor: No Temperature: No Abnormality N/A N/A Tenderness on Yes N/A N/A Palpation: Wound Preparation: Ulcer Cleansing: N/A N/A Rinsed/Irrigated with Saline Topical  Anesthetic Applied: Other: lidocaine 4% Treatment Notes Electronic Signature(s) Signed: 05/28/2015 4:19:08 PM By: Alric Quan Entered By: Alric Quan on 05/28/2015 14:33:26 [name] ([name]) -------------------------------------------------------------------------------- [place] Details Patient Name: [name] Date of Service: 05/28/2015 2:15 PM Medical Record Number: [name] Patient Account Number: [id number] Date of Birth/Sex: [birth year] (74 y.o. Female) Treating RN: Carolyne Fiscal, Debi Primary Care Physician: Emily Filbert Other Clinician: Referring Physician: Emily Filbert Treating Physician/Extender: BURNS III, Charlean Sanfilippo in Treatment: 3 Active Inactive Abuse / Safety / Falls / Self Care Management Nursing Diagnoses: Potential for falls Goals: Patient will not experience any injury related to fire Date Initiated: 05/07/2015 Goal Status: Active Interventions: Assess fall risk on admission and as needed Notes: Nutrition Nursing Diagnoses: Potential for alteratiion in Nutrition/Potential for imbalanced nutrition Goals: Patient/caregiver agrees to and verbalizes understanding of need to use nutritional supplements and/or vitamins as prescribed Date Initiated: 05/07/2015 Goal Status: Active Interventions: Provide education on nutrition Treatment Activities: Education provided on Nutrition : 05/14/2015 Notes: Orientation to the Wound Care Program Nursing Diagnoses: Knowledge deficit related to the wound healing center program [name], [name] ([name]) Goals: Patient/caregiver will verbalize understanding of the [facility] Program Date Initiated: 05/07/2015 Goal Status: Active Interventions: Provide education on orientation to the wound center Notes: Wound/Skin Impairment Nursing Diagnoses: Impaired tissue integrity Knowledge deficit related to smoking impact on wound healing Knowledge deficit  related to ulceration/compromised skin integrity Goals: Patient will demonstrate a reduced rate of smoking or cessation of smoking Date Initiated: 05/07/2015 Goal Status: Active Ulcer/skin breakdown will have a volume reduction of 30% by week 4 Date Initiated: 05/07/2015 Goal Status: Active Ulcer/skin breakdown will have a volume reduction of 50% by week 8 Date Initiated: 05/07/2015 Goal Status: Active Ulcer/skin breakdown will have a volume reduction of 80% by week 12 Date Initiated: 05/07/2015 Goal Status: Active Interventions: Assess patient/caregiver ability to obtain necessary supplies Assess patient/caregiver ability to perform ulcer/skin care regimen upon admission and as needed Notes: Electronic Signature(s) Signed: 05/28/2015 4:19:08 PM By: Alric Quan Entered By: Alric Quan on 05/28/2015 14:33:18 [name] ([name]) -------------------------------------------------------------------------------- Pain Assessment Details Patient Name: [name] Date of Service: 05/28/2015 2:15 PM Medical Record Number: [name] Patient Account Number: [id number] Date of Birth/Sex: 03-Nov-1940 (74 y.o. Female) Treating RN: Ahmed Prima Primary Care Physician: Emily Filbert Other Clinician: Referring Physician: Emily Filbert Treating Physician/Extender: BURNS III, Charlean Sanfilippo in Treatment: 3 Active Problems Location of Pain Severity and Description of Pain Patient Has Paino No Site Locations Pain Management and Medication Current Pain Management: Electronic Signature(s) Signed: 05/28/2015 4:19:08 PM By: Alric Quan Entered By: Alric Quan on 05/28/2015 14:24:12 [name] ([name]) -------------------------------------------------------------------------------- Patient/Caregiver Education Details Patient Name: [name] Date of Service: 05/28/2015 2:15  PM Medical Record Number: [name] Patient Account Number:  [id number] Date of Birth/Gender: 1940/10/23 (74 y.o. Female) Treating RN: Ahmed Prima Primary Care Physician: Emily Filbert Other Clinician: Referring Physician: Emily Filbert Treating Physician/Extender: BURNS III, Charlean Sanfilippo in Treatment: 3 Education Assessment Education Provided To: Patient Education Topics Provided Wound/Skin Impairment: Handouts: Other: change dressing as ordered Methods: Demonstration, Explain/Verbal Responses: State content correctly Electronic Signature(s) Signed: 05/28/2015 4:19:08 PM By: Alric Quan Entered By: Alric Quan on 05/28/2015 14:56:21 [name] ([name]) -------------------------------------------------------------------------------- Wound Assessment Details Patient Name: [name] Date of Service: 05/28/2015 2:15 PM Medical Record Number: [name] Patient Account Number: [id number] Date of Birth/Sex: 07/12/1940 (74 y.o. Female) Treating RN: Carolyne Fiscal, Debi Primary Care Physician: Emily Filbert Other Clinician: Referring Physician: Emily Filbert Treating Physician/Extender: BURNS III, Charlean Sanfilippo in Treatment: 3 Wound Status Wound Number: 2 Primary Etiology: Open Surgical Wound Wound Location: Left Lower Leg - Anterior Wound Status: Open Wounding Event: Surgical Injury Comorbid History: Cataracts, Rheumatoid Arthritis Date Acquired: 04/06/2015 Weeks Of Treatment: 3 Clustered Wound: No Photos Photo Uploaded By: Gretta Cool, RN, BSN, Kim on 05/28/2015 16:49:41 Wound Measurements Length: (cm) 0.4 Width: (cm) 0.3 Depth: (cm) 0.1 Area: (cm) 0.094 Volume: (cm) 0.009 % Reduction in Area: 52% % Reduction in Volume: 84.7% Epithelialization: None Tunneling: No Undermining: No Wound Description Classification: Partial Thickness Wound Margin: Flat and Intact Exudate Amount: None Present Foul Odor After Cleansing: No Wound Bed Granulation Amount: None Present (0%) Exposed Structure Necrotic Amount:  Large (67-100%) Fascia Exposed: No Necrotic Quality: Adherent Slough Fat Layer Exposed: No Tendon Exposed: No Muscle Exposed: No Joint Exposed: No [name], [name] ([name]) Bone Exposed: No Limited to Skin Breakdown Periwound Skin Texture Texture Color No Abnormalities Noted: Yes No Abnormalities Noted: No Atrophie Blanche: No Moisture Cyanosis: No No Abnormalities Noted: No Ecchymosis: No Dry / Scaly: No Erythema: No Maceration: No Hemosiderin Staining: No Moist: No Mottled: No Pallor: No Rubor: No Temperature / Pain Temperature: No Abnormality Tenderness on Palpation: Yes Wound Preparation Ulcer Cleansing: Rinsed/Irrigated with Saline Topical Anesthetic Applied: Other: lidocaine 4%, Treatment Notes Wound #2 (Left, Anterior Lower Leg) 1. Cleansed with: Clean wound with Normal Saline 2. Anesthetic Topical Lidocaine 4% cream to wound bed prior to debridement 4. Dressing Applied: Other dressing (specify in notes) 5. Secondary Dressing Applied Bordered Foam Dressing Electronic Signature(s) Signed: 05/28/2015 4:19:08 PM By: Alric Quan Entered By: Alric Quan on 05/28/2015 14:47:00 [name] ([name]) -------------------------------------------------------------------------------- [place] Details Patient Name: [name] Date of Service: 05/28/2015 2:15 PM Medical Record Number: [name] Patient Account Number: [id number] Date of Birth/Sex: May 15, 1941 (74 y.o. Female) Treating RN: Carolyne Fiscal, Debi Primary Care Physician: Emily Filbert Other Clinician: Referring Physician: Emily Filbert Treating Physician/Extender: BURNS III, Charlean Sanfilippo in Treatment: 3 Vital Signs Time Taken: 14:24 Temperature (F): 98.3 Height (in): 64 Pulse (bpm): 85 Weight (lbs): 88 Respiratory Rate (breaths/min): 18 Body Mass Index (BMI): 15.1 Blood Pressure (mmHg): 121/82 Reference Range: 80 - 120 mg / dl Electronic Signature(s) Signed: 05/28/2015  4:19:08 PM By: Alric Quan Entered By: Alric Quan on 05/28/2015 14:24:36

## 2015-06-03 ENCOUNTER — Ambulatory Visit
Admission: RE | Admit: 2015-06-03 | Discharge: 2015-06-03 | Disposition: A | Discharge status: Home / Self Care | Payer: Medicare Other | Source: Ambulatory Visit | Attending: Internal Medicine | Admitting: Internal Medicine

## 2015-06-03 ENCOUNTER — Other Ambulatory Visit: Payer: Self-pay | Admitting: Internal Medicine

## 2015-06-03 DIAGNOSIS — Z1231 Encounter for screening mammogram for malignant neoplasm of breast: Secondary | ICD-10-CM

## 2015-06-04 ENCOUNTER — Encounter: Payer: Medicare Other | Admitting: Surgery

## 2015-06-04 DIAGNOSIS — L97222 Non-pressure chronic ulcer of left calf with fat layer exposed: Secondary | ICD-10-CM | POA: Diagnosis not present
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
## 2015-06-05 NOTE — Progress Notes (Signed)
CHASEY, TREECE ([id number]) Visit Report for 06/04/2015 Chief Complaint Document Details Patient Name: [name], [name] 06/04/2015 11:30 Date of Service: AM Medical Record [id number] Number: Patient Account Number: [id number] 1941/04/28 (74 y.o. Treating RN: Montey Hora Date of Birth/Sex: Female) Other Clinician: Primary Care Physician: Emily Filbert Treating BURNS III, WALTER Referring Physician: Emily Filbert Physician/Extender: Suella Grove in Treatment: 4 Information Obtained from: Patient Chief Complaint Left calf ulceration. Electronic Signature(s) Signed: 06/04/2015 4:23:19 PM By: Loletha Grayer MD Entered By: Loletha Grayer on 06/04/2015 14:39:50 [name] ([id number]) -------------------------------------------------------------------------------- Debridement Details Patient Name: [name], [name] 06/04/2015 11:30 Date of Service: AM Medical Record [id number] Number: Patient Account Number: [id number] 03/06/1941 (74 y.o. Treating RN: Montey Hora Date of Birth/Sex: Female) Other Clinician: Primary Care Physician: Emily Filbert Treating BURNS III, WALTER Referring Physician: Emily Filbert Physician/Extender: Suella Grove in Treatment: 4 Debridement Performed for Wound #2 Left,Anterior Lower Leg Assessment: Performed By: Physician BURNS III, Teressa Senter., MD Debridement: Debridement Pre-procedure Yes Verification/Time Out Taken: Start Time: 11:57 Pain Control: Lidocaine 4% Topical Solution Level: Skin/Subcutaneous Tissue Total Area Debrided (L x 0.3 (cm) x 0.2 (cm) = 0.06 (cm) W): Tissue and other Viable, Non-Viable, Fat, Fibrin/Slough, Subcutaneous material debrided: Instrument: Curette Bleeding: Minimum Hemostasis Achieved: Pressure End Time: 11:58 Procedural Pain: 0 Post Procedural Pain: 0 Response to Treatment: Procedure was tolerated well Post Debridement Measurements of Total Wound Length: (cm) 0.3 Width: (cm) 0.2 Depth: (cm)  0.3 Volume: (cm) 0.014 Post Procedure Diagnosis Same as Pre-procedure Electronic Signature(s) Signed: 06/04/2015 4:23:19 PM By: Loletha Grayer MD Signed: 06/04/2015 5:51:20 PM By: Montey Hora Entered By: Loletha Grayer on 06/04/2015 14:39:41 [name] ([id number]) -------------------------------------------------------------------------------- HPI Details Patient Name: [name], [name] 06/04/2015 11:30 Date of Service: AM Medical Record [id number] Number: Patient Account Number: [id number] 10-01-1940 (74 y.o. Treating RN: Montey Hora Date of Birth/Sex: Female) Other Clinician: Primary Care Physician: Emily Filbert Treating BURNS III, WALTER Referring Physician: Emily Filbert Physician/Extender: Weeks in Treatment: 4 History of Present Illness HPI Description: Pleasant 74 year old with history of rheumatoid arthritis, smoking, and COPD. No history of diabetes or PAD. Left ABI 1.2. 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 Pathology showed positive margins. Underwent wide reexcision on 02/03/2015. Pathology showed no residual carcinoma. The wound was closed primarily but dehisced. She developed localized edema and was treated with Unna boots without significant improvement. Completed a course of doxycycline. Performing dressing changes with Promogran. Using a Tubigrip for edema control. Cannot apply compression stockings secondary to weakness in her hands. She returns to clinic for follow-up and is without new complaints. She denies any significant pain. No fever or chills. No significant drainage. Electronic Signature(s) Signed: 06/04/2015 4:23:19 PM By: Loletha Grayer MD Entered By: Loletha Grayer on 06/04/2015 14:40:35 [name] ([id number]) -------------------------------------------------------------------------------- Physical Exam Details Patient Name: [name], [name] 06/04/2015  11:30 Date of Service: AM Medical Record [id number] Number: Patient Account Number: [id number] [birth year] (74 y.o. Treating RN: Montey Hora Date of Birth/Sex: Female) Other Clinician: Primary Care Physician: Emily Filbert Treating BURNS III, WALTER Referring Physician: Emily Filbert Physician/Extender: Weeks in Treatment: 4 Constitutional . Pulse regular. Respirations normal and unlabored. Afebrile. . Notes Left anterior calf ulceration improved. Full thickness. No exposed deep structures. No evidence for infection. Minimal edema. Palpable DP. L ABI 1.2. Electronic Signature(s) Signed: 06/04/2015 4:23:19 PM By: Loletha Grayer MD Entered By: Loletha Grayer on 06/04/2015 14:41:04 [name] ([id number]) -------------------------------------------------------------------------------- Physician Orders Details Patient  Name: [name], Pamela J. 06/04/2015 11:30 Date of Service: AM Medical Record [id number] Number: Patient Account Number: [id number] May 17, 1941 (74 y.o. Treating RN: Montey Hora Date of Birth/Sex: Female) Other Clinician: Primary Care Physician: Emily Filbert Treating BURNS III, WALTER Referring Physician: Emily Filbert Physician/Extender: Suella Grove in Treatment: 4 Verbal / Phone Orders: Yes Clinician: Montey Hora Read Back and Verified: Yes Diagnosis Coding Wound Cleansing Wound #2 Left,Anterior Lower Leg o Clean wound with Normal Saline. Primary Wound Dressing Wound #2 Left,Anterior Lower Leg o Promogran Secondary Dressing Wound #2 Left,Anterior Lower Leg o Boardered Foam Dressing Dressing Change Frequency Wound #2 Left,Anterior Lower Leg o Change dressing every other day. Follow-up Appointments Wound #2 Left,Anterior Lower Leg o Return Appointment in 2 weeks. Edema Control Wound #2 Left,Anterior Lower Leg o Tubigrip - left leg Electronic Signature(s) Signed: 06/04/2015 2:40:28 PM By: Montey Hora Signed: 06/04/2015  4:23:19 PM By: Loletha Grayer MD Entered By: Montey Hora on 06/04/2015 14:40:28 [name] ([id number]) -------------------------------------------------------------------------------- Problem List Details Patient Name: [name], [name] 06/04/2015 11:30 Date of Service: AM Medical Record [id number] Number: Patient Account Number: [id number] 19-Feb-1941 (74 y.o. 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 T81.31XA Disruption of external operation (surgical) wound, not 05/07/2015 Yes elsewhere classified, initial encounter M06.9 Rheumatoid arthritis, unspecified 05/07/2015 Yes J44.9 Chronic obstructive pulmonary disease, unspecified 05/07/2015 Yes Inactive Problems Resolved Problems ICD-10 Code Description Active Date Resolved Date L02.521 Furuncle right hand 05/07/2015 05/07/2015 Electronic Signature(s) Signed: 06/04/2015 4:23:19 PM By: Loletha Grayer MD Entered By: Loletha Grayer on 06/04/2015 14:39:15 [name] ([id number]) -------------------------------------------------------------------------------- Progress Note Details Patient Name: [name], [name] 06/04/2015 11:30 Date of Service: AM Medical Record [id number] Number: Patient Account Number: [id number] [birth year] (74 y.o. Treating RN: Montey Hora Date of Birth/Sex: Female) Other Clinician: Primary Care Physician: Emily Filbert Treating BURNS III, WALTER Referring Physician: Emily Filbert Physician/Extender: Suella Grove in Treatment: 4 Subjective Chief Complaint Information obtained from Patient Left calf ulceration. History of Present Illness  (HPI) Pleasant 74 year old with history of rheumatoid arthritis, smoking, and COPD. No history of diabetes or PAD. Left ABI 1.2. 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 Pathology showed positive margins. Underwent wide reexcision on 02/03/2015. Pathology showed no residual carcinoma. The wound was closed primarily but dehisced. She developed localized edema and was treated with Unna boots without significant improvement. Completed a course of doxycycline. Performing dressing changes with Promogran. Using a Tubigrip for edema control. Cannot apply compression stockings secondary to weakness in her hands. She returns to clinic for follow-up and is without new complaints. She denies any significant pain. No fever or chills. No significant drainage. Objective Constitutional Pulse regular. Respirations normal and unlabored. Afebrile. Vitals Time Taken: 11:43 AM, Height: 64 in, Weight: 88 lbs, BMI: 15.1, Temperature: 98.1 F, Pulse: 84 bpm, Respiratory Rate: 18 breaths/min, Blood Pressure: 89/65 mmHg. General Notes: Left anterior calf ulceration improved. Full thickness. No exposed deep structures. No evidence for infection. Minimal edema. Palpable DP. L ABI 1.2. Integumentary (Hair, Skin) [name], [name]. ([id number]) Wound #2 status is Open. Original cause of wound was Surgical Injury. The wound is located on the Left,Anterior Lower Leg. The wound measures 0.3cm length x 0.1cm width x 0.2cm depth; 0.024cm^2 area and 0.005cm^3  volume. The wound is limited to skin breakdown. There is no tunneling or undermining noted. There is a none present amount of drainage noted. The wound margin is flat and intact. There is large (67-100%) red granulation within the wound bed. There is a small (1-33%) amount of necrotic tissue within the wound bed including Adherent Slough. The periwound skin appearance did not exhibit: Callus, Crepitus, Excoriation,  Fluctuance, Friable, Induration, Localized Edema, Rash, Scarring, Dry/Scaly, Maceration, Moist, Atrophie Blanche, Cyanosis, Ecchymosis, Hemosiderin Staining, Mottled, Pallor, Rubor, Erythema. Periwound temperature was noted as No Abnormality. The periwound has tenderness on palpation. Assessment Active Problems ICD-10 [id number] - Non-pressure chronic ulcer of left calf with fat layer exposed T81.31XA - Disruption of external operation (surgical) wound, not elsewhere classified, initial encounter M06.9 - Rheumatoid arthritis, unspecified J44.9 - Chronic obstructive pulmonary disease, unspecified L calf ulcer s/p excision of SCCA Procedures Wound #2 Wound #2 is an Open Surgical Wound located on the Left,Anterior Lower Leg . There was a Skin/Subcutaneous Tissue Debridement [id number]) debridement with total area of 0.06 sq cm performed by BURNS III, Teressa Senter., MD. with the following instrument(s): Curette to remove Viable and Non-Viable tissue/material including Fat, Fibrin/Slough, and Subcutaneous after achieving pain control using Lidocaine 4% Topical Solution. A time out was conducted prior to the start of the procedure. A Minimum amount of bleeding was controlled with Pressure. The procedure was tolerated well with a pain level of 0 throughout and a pain level of 0 following the procedure. Post Debridement Measurements: 0.3cm length x 0.2cm width x 0.3cm depth; 0.014cm^3 volume. Post procedure Diagnosis Wound #2: Same as Pre-Procedure [name], [name] ([id number]) Plan Wound Cleansing: Wound #2 Left,Anterior Lower Leg: Clean wound with Normal Saline. Primary Wound Dressing: Wound #2 Left,Anterior Lower Leg: Promogran Secondary Dressing: Wound #2 Left,Anterior Lower Leg: Boardered Foam Dressing Dressing Change Frequency: Wound #2 Left,Anterior Lower Leg: Change dressing every other day. Follow-up Appointments: Wound #2 Left,Anterior Lower Leg: Return Appointment in 2  weeks. Edema Control: Wound #2 Left,Anterior Lower Leg: Tubigrip - left leg Promogran. Tubigrip. Electronic Signature(s) Signed: 06/04/2015 4:23:19 PM By: Loletha Grayer MD Entered By: Loletha Grayer on 06/04/2015 14:41:44 [name] ([id number]) -------------------------------------------------------------------------------- SuperBill Details Patient Name: [name] Date of Service: 06/04/2015 Medical Record Patient Account Number: [id number] Number: Treating RN: Montey Hora Dec 22, 1940 (74 y.o. Other Clinician: Date of Birth/Sex: Female) Treating BURNS III, Primary Care Physician: Emily Filbert Physician/Extender: Thayer Jew Referring Physician: Melina Modena in Treatment: 4 Diagnosis Coding ICD-10 Codes Code Description [phone] Non-pressure chronic ulcer of left calf with fat layer exposed Disruption of external operation (surgical) wound, not elsewhere classified, initial T81.31XA encounter M06.9 Rheumatoid arthritis, unspecified J44.9 Chronic obstructive pulmonary disease, unspecified Facility Procedures CPT4 Code: [id number] Description: [id number] - DEB SUBQ TISSUE 20 SQ CM/< ICD-10 Description Diagnosis L97.222 Non-pressure chronic ulcer of left calf with fat la Modifier: yer exposed Quantity: 1 Physician Procedures CPT4 Code: [id number] Description: 11042 - WC PHYS SUBQ TISS 20 SQ CM ICD-10 Description Diagnosis L97.222 Non-pressure chronic ulcer of left calf with fat la Modifier: yer exposed Quantity: 1 Electronic Signature(s) Signed: 06/04/2015 4:23:19 PM By: Loletha Grayer MD Entered By: Loletha Grayer on 06/04/2015 14:42:17

## 2015-06-11 ENCOUNTER — Encounter: Payer: Medicare Other | Attending: Surgery | Admitting: Surgery

## 2015-06-11 DIAGNOSIS — J449 Chronic obstructive pulmonary disease, unspecified: Secondary | ICD-10-CM | POA: Diagnosis not present

## 2015-06-11 DIAGNOSIS — M069 Rheumatoid arthritis, unspecified: Secondary | ICD-10-CM | POA: Insufficient documentation

## 2015-06-11 DIAGNOSIS — L97222 Non-pressure chronic ulcer of left calf with fat layer exposed: Secondary | ICD-10-CM | POA: Diagnosis not present

## 2015-06-11 DIAGNOSIS — T8131XS Disruption of external operation (surgical) wound, not elsewhere classified, sequela: Secondary | ICD-10-CM | POA: Diagnosis not present
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
## 2015-06-12 NOTE — Progress Notes (Signed)
Pamela, [name] ([id number]) Visit Report for 06/11/2015 Chief Complaint Document Details Patient Name: [name], [name] Date of Service: 06/11/2015 9:15 AM Medical Record Patient Account Number: [id number] Number: Treating RN: 11-06-1940 (75 y.o. Other Clinician: Date of Birth/Sex: Female) Treating BURNS III, Primary Care Physician: Emily Filbert Physician/Extender: Thayer Jew Referring Physician: Melina Modena in Treatment: 5 Information Obtained from: Patient Chief Complaint Left calf ulceration. Electronic Signature(s) Signed: 06/12/2015 8:51:38 AM By: Loletha Grayer MD Entered By: Loletha Grayer on 06/11/2015 09:58:25 [name] ([id number]) -------------------------------------------------------------------------------- Debridement Details Patient Name: [name] Date of Service: 06/11/2015 9:15 AM Medical Record Patient Account Number: [id number] Number: Treating RN: [birth year] (75 y.o. Other Clinician: Date of Birth/Sex: Female) Treating BURNS III, Primary Care Physician: Emily Filbert Physician/Extender: Thayer Jew Referring Physician: Melina Modena in Treatment: 5 Debridement Performed for Wound #2 Left,Anterior Lower Leg Assessment: Performed By: Physician BURNS III, Teressa Senter., MD Debridement: Debridement Pre-procedure Yes Verification/Time Out Taken: Start Time: 09:48 Pain Control: Other : lidocaine 4% Level: Skin/Subcutaneous Tissue Total Area Debrided (L x 0.3 (cm) x 0.2 (cm) = 0.06 (cm) W): Tissue and other Viable, Non-Viable, Exudate, Fat, Fibrin/Slough, Subcutaneous material debrided: Instrument: Curette Bleeding: Minimum Hemostasis Achieved: Pressure End Time: 09:50 Procedural Pain: 0 Post Procedural Pain: 0 Response to Treatment: Procedure was tolerated well Post Debridement Measurements of Total Wound Length: (cm) 0.3 Width: (cm) 0.2 Depth: (cm) 0.3 Volume: (cm) 0.014 Post Procedure  Diagnosis Same as Pre-procedure Electronic Signature(s) Signed: 06/12/2015 8:51:38 AM By: Loletha Grayer MD Entered By: Loletha Grayer on 06/11/2015 09:58:16 [name] ([id number]) -------------------------------------------------------------------------------- HPI Details Patient Name: [name] Date of Service: 06/11/2015 9:15 AM Medical Record Patient Account Number: [id number] Number: Treating RN: 1940/06/19 (75 y.o. Other Clinician: Date of Birth/Sex: Female) Treating BURNS III, Primary Care Physician: Emily Filbert Physician/Extender: Thayer Jew Referring Physician: Melina Modena in Treatment: 5 History of Present Illness HPI Description: Pleasant 75 year old with history of rheumatoid arthritis, smoking, and COPD. No history of diabetes or PAD. Left ABI 1.2. 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 Pathology showed positive margins. Underwent wide reexcision on 02/03/2015. Pathology showed no residual carcinoma. The wound was closed primarily but dehisced. She developed localized edema and was treated with Unna boots without significant improvement. Completed a course of doxycycline. Performing dressing changes with Promogran. Using a Tubigrip for edema control. Cannot apply compression stockings secondary to weakness in her hands. She returns to clinic for follow-up and is without new complaints. She denies any significant pain. No fever or chills. No significant drainage. Electronic Signature(s) Signed: 06/12/2015 8:51:38 AM By: Loletha Grayer MD Entered By: Loletha Grayer on 06/11/2015 09:58:51 [name] ([id number]) -------------------------------------------------------------------------------- Physical Exam Details Patient Name: [name] Date of Service: 06/11/2015 9:15 AM Medical Record Patient Account Number: [id number] Number: Treating RN: 01-30-1941 (75  y.o. Other Clinician: Date of Birth/Sex: Female) Treating BURNS III, Primary Care Physician: Emily Filbert Physician/Extender: Thayer Jew Referring Physician: Melina Modena in Treatment: 5 Constitutional . Pulse regular. Respirations normal and unlabored. Afebrile. . Notes Left anterior calf ulceration minimally improved. Full thickness. No exposed deep structures. No evidence for infection. Minimal edema. Palpable DP. L ABI 1.2. Electronic Signature(s) Signed: 06/12/2015 8:51:38 AM By: Loletha Grayer MD Entered By: Loletha Grayer on 06/11/2015 09:59:21 [name] ([id number]) -------------------------------------------------------------------------------- Physician Orders Details Patient Name: [name] Date of Service: 06/11/2015 9:15 AM Medical Record Patient  Account Number: [id number] Number: Treating RN: Cornell Barman Sep 16, 1940 [id number] y.o. Other Clinician: Date of Birth/Sex: Female) Treating BURNS III, Primary Care Physician: Emily Filbert Physician/Extender: Thayer Jew Referring Physician: Melina Modena in Treatment: 5 Verbal / Phone Orders: Yes Clinician: Cornell Barman Read Back and Verified: Yes Diagnosis Coding Wound Cleansing Wound #2 Left,Anterior Lower Leg o Clean wound with Normal Saline. Anesthetic Wound #2 Left,Anterior Lower Leg o Topical Lidocaine 4% cream applied to wound bed prior to debridement Primary Wound Dressing Wound #2 Left,Anterior Lower Leg o Promogran Secondary Dressing Wound #2 Left,Anterior Lower Leg o Boardered Foam Dressing Dressing Change Frequency Wound #2 Left,Anterior Lower Leg o Change dressing every other day. Follow-up Appointments Wound #2 Left,Anterior Lower Leg o Return Appointment in 2 weeks. Edema Control Wound #2 Left,Anterior Lower Leg o Tubigrip - left leg Notes Order Epifix 14 mm Electronic Signature(s) IRAH, ORLOSKY ([id number]) Signed: 06/11/2015 5:28:49 PM By: Gretta Cool  RN, BSN, Kim RN, BSN Signed: 06/12/2015 8:51:38 AM By: Loletha Grayer MD Entered By: Gretta Cool RN, BSN, Kim on 06/11/2015 09:50:43 [name] ([id number]) -------------------------------------------------------------------------------- Problem List Details Patient Name: [name] Date of Service: 06/11/2015 9:15 AM Medical Record Patient Account Number: [id number] Number: Treating RN: [birth year] (75 y.o. 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 T81.31XS Disruption of external operation (surgical) wound, not 05/07/2015 Yes elsewhere classified, sequela M06.9 Rheumatoid arthritis, unspecified 05/07/2015 Yes J44.9 Chronic obstructive pulmonary disease, unspecified 05/07/2015 Yes Inactive Problems Resolved Problems Electronic Signature(s) Signed: 06/12/2015 8:51:38 AM By: Loletha Grayer MD Entered By: Loletha Grayer on 06/11/2015 09:57:42 [name] ([id number]) -------------------------------------------------------------------------------- Progress Note Details Patient Name: [name] Date of Service: 06/11/2015 9:15 AM Medical Record Patient Account Number: [id number] Number: Treating RN: [birth year] (75 y.o. Other Clinician: Date of Birth/Sex: Female) Treating BURNS III, Primary Care Physician: Emily Filbert Physician/Extender: Thayer Jew Referring Physician: Melina Modena in Treatment: 5 Subjective Chief Complaint Information obtained from Patient Left calf ulceration. History of Present Illness (HPI) Pleasant 76 year old with history of rheumatoid arthritis, smoking, and COPD. No history of diabetes or PAD. Left ABI  1.2. 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 Pathology showed positive margins. Underwent wide reexcision on 02/03/2015. Pathology showed no residual carcinoma. The wound was closed primarily but dehisced. She developed localized edema and was treated with Unna boots without significant improvement. Completed a course of doxycycline. Performing dressing changes with Promogran. Using a Tubigrip for edema control. Cannot apply compression stockings secondary to weakness in her hands. She returns to clinic for follow-up and is without new complaints. She denies any significant pain. No fever or chills. No significant drainage. Objective Constitutional Pulse regular. Respirations normal and unlabored. Afebrile. Vitals Time Taken: 9:32 AM, Height: 64 in, Weight: 88 lbs, BMI: 15.1, Temperature: 98.3 F, Pulse: 79 bpm, Respiratory Rate: 18 breaths/min, Blood Pressure: 114/65 mmHg. General Notes: Left anterior calf ulceration minimally improved. Full thickness. No exposed deep structures. No evidence for infection. Minimal edema. Palpable DP. L ABI 1.2. Integumentary (Hair, Skin) JACKLEEN, HOLLOWAY. ([id number]) Wound #2 status is Open. Original cause of wound was Surgical Injury. The wound is located on the Left,Anterior Lower Leg. The wound measures 0.3cm length x 0.1cm width x 0.2cm depth; 0.024cm^2  area and 0.005cm^3 volume. The wound is limited to skin breakdown. There is no tunneling or undermining noted. There is a none present amount of drainage noted. The wound margin is flat and intact. There is large (67-100%) red granulation within the wound bed. There is a small (1-33%) amount of necrotic tissue within the wound bed including Adherent Slough. The periwound skin appearance did not exhibit: Callus, Crepitus, Excoriation, Fluctuance, Friable, Induration, Localized Edema, Rash, Scarring, Dry/Scaly, Maceration, Moist, Atrophie Blanche, Cyanosis,  Ecchymosis, Hemosiderin Staining, Mottled, Pallor, Rubor, Erythema. Periwound temperature was noted as No Abnormality. The periwound has tenderness on palpation. Assessment Active Problems ICD-10 [id number] - Non-pressure chronic ulcer of left calf with fat layer exposed T81.31XS - Disruption of external operation (surgical) wound, not elsewhere classified, sequela M06.9 - Rheumatoid arthritis, unspecified J44.9 - Chronic obstructive pulmonary disease, unspecified Left calf ulceration, status post squamous cell carcinoma excision. Procedures Wound #2 Wound #2 is an Open Surgical Wound located on the Left,Anterior Lower Leg . There was a Skin/Subcutaneous Tissue Debridement [id number]) debridement with total area of 0.06 sq cm performed by BURNS III, Teressa Senter., MD. with the following instrument(s): Curette to remove Viable and Non-Viable tissue/material including Exudate, Fat, Fibrin/Slough, and Subcutaneous after achieving pain control using Other (lidocaine 4%). A time out was conducted prior to the start of the procedure. A Minimum amount of bleeding was controlled with Pressure. The procedure was tolerated well with a pain level of 0 throughout and a pain level of 0 following the procedure. Post Debridement Measurements: 0.3cm length x 0.2cm width x 0.3cm depth; 0.014cm^3 volume. Post procedure Diagnosis Wound #2: Same as Pre-Procedure ANGELIKI, BUGH ([id number]) Plan Wound Cleansing: Wound #2 Left,Anterior Lower Leg: Clean wound with Normal Saline. Anesthetic: Wound #2 Left,Anterior Lower Leg: Topical Lidocaine 4% cream applied to wound bed prior to debridement Primary Wound Dressing: Wound #2 Left,Anterior Lower Leg: Promogran Secondary Dressing: Wound #2 Left,Anterior Lower Leg: Boardered Foam Dressing Dressing Change Frequency: Wound #2 Left,Anterior Lower Leg: Change dressing every other day. Follow-up Appointments: Wound #2 Left,Anterior Lower Leg: Return  Appointment in 2 weeks. Edema Control: Wound #2 Left,Anterior Lower Leg: Tubigrip - left leg General Notes: Order Epifix 14 mm Continue with collagen dressing and Tubigrip. We will try to get insurance preapproval for skin substitute (14 mm Epifix). Electronic Signature(s) Signed: 06/12/2015 8:51:38 AM By: Loletha Grayer MD Entered By: Loletha Grayer on 06/11/2015 10:00:33 [name] ([id number]) -------------------------------------------------------------------------------- SuperBill Details Patient Name: [name] Date of Service: 06/11/2015 Medical Record Patient Account Number: [id number] Number: Treating RN: 12-30-1940 (75 y.o. Other Clinician: Date of Birth/Sex: Female) Treating BURNS III, Primary Care Physician: Emily Filbert Physician/Extender: Thayer Jew Referring Physician: Melina Modena in Treatment: 5 Diagnosis Coding ICD-10 Codes Code Description [phone] Non-pressure chronic ulcer of left calf with fat layer exposed T81.31XS Disruption of external operation (surgical) wound, not elsewhere classified, sequela M06.9 Rheumatoid arthritis, unspecified J44.9 Chronic obstructive pulmonary disease, unspecified Facility Procedures CPT4 Code: [id number] Description: [id number] - DEB SUBQ TISSUE 20 SQ CM/< ICD-10 Description Diagnosis L97.222 Non-pressure chronic ulcer of left calf with fat l Modifier: ayer exposed Quantity: 1 Physician Procedures CPT4 Code: [id number] Description: 11042 - WC PHYS SUBQ TISS 20 SQ CM ICD-10 Description Diagnosis L97.222 Non-pressure chronic ulcer of left calf with fat l Modifier: ayer exposed Quantity: 1 Electronic Signature(s) Signed: 06/12/2015 8:51:38 AM By: Loletha Grayer MD Entered By: Loletha Grayer on 06/11/2015 10:00:46

## 2015-06-18 ENCOUNTER — Ambulatory Visit: Payer: Medicare Other | Admitting: Surgery

## 2015-06-25 ENCOUNTER — Encounter: Payer: Medicare Other | Admitting: Surgery

## 2015-06-25 DIAGNOSIS — L97222 Non-pressure chronic ulcer of left calf with fat layer exposed: Secondary | ICD-10-CM | POA: Diagnosis not present

## 2015-06-26 NOTE — Progress Notes (Signed)
RIAH, FELLENZ ([id number]) Visit Report for 06/25/2015 Arrival Information Details Patient Name: [name], [name] Date of Service: 06/25/2015 10:45 AM Medical Record Number: [id number] Patient Account Number: [id number] Date of Birth/Sex: 08-Dec-1940 (75 y.o. Female) Treating RN: Afful, RN, BSN, Velva Harman Primary Care Physician: Emily Filbert Other Clinician: Referring Physician: Emily Filbert Treating Physician/Extender: BURNS III, Charlean Sanfilippo in Treatment: 7 Visit Information History Since Last Visit Added or deleted any medications: No Patient Arrived: Ambulatory Any new allergies or adverse reactions: No Arrival Time: 10:53 Had a fall or experienced change in No Accompanied By: self activities of daily living that may affect Transfer Assistance: None risk of falls: Patient Identification Verified: Yes Signs or symptoms of abuse/neglect since last No Secondary Verification Process Yes visito Completed: Has Dressing in Place as Prescribed: Yes Patient Requires Transmission-Based No Pain Present Now: No Precautions: Patient Has Alerts: No Electronic Signature(s) Signed: 06/25/2015 4:59:08 PM By: Regan Lemming BSN, RN Entered By: Regan Lemming on 06/25/2015 10:54:06 [name] ([id number]) -------------------------------------------------------------------------------- Encounter Discharge Information Details Patient Name: [name] Date of Service: 06/25/2015 10:45 AM Medical Record Number: [id number] Patient Account Number: [id number] Date of Birth/Sex: 07-02-1940 (75 y.o. Female) Treating RN: Baruch Gouty, RN, BSN, Velva Harman Primary Care Physician: Emily Filbert Other Clinician: Referring Physician: Emily Filbert Treating Physician/Extender: BURNS III, Charlean Sanfilippo in Treatment: 7 Encounter Discharge Information Items Discharge Pain Level: 0 Discharge Condition: Stable Ambulatory Status: Ambulatory Discharge Destination: Home Transportation: Private Auto Accompanied By:  self Schedule Follow-up Appointment: No Medication Reconciliation completed and provided to Patient/Care No Levaughn Puccinelli: Provided on Clinical Summary of Care: 06/25/2015 Form Type Recipient Paper Patient [place] Electronic Signature(s) Signed: 06/25/2015 11:39:49 AM By: Regan Lemming BSN, RN Previous Signature: 06/25/2015 11:21:17 AM Version By: Ruthine Dose Entered By: Regan Lemming on 06/25/2015 11:39:49 [name] ([id number]) -------------------------------------------------------------------------------- Lower Extremity Assessment Details Patient Name: [name] Date of Service: 06/25/2015 10:45 AM Medical Record Number: [id number] Patient Account Number: [id number] Date of Birth/Sex: 1941/05/17 (74 y.o. Female) Treating RN: Afful, RN, BSN, Velva Harman Primary Care Physician: Emily Filbert Other Clinician: Referring Physician: Emily Filbert Treating Physician/Extender: BURNS III, Charlean Sanfilippo in Treatment: 7 Edema Assessment Assessed: [Left: No] [Right: No] E[Left: dema] [Right: :] Calf Left: Right: Point of Measurement: 32 cm From Medial Instep 27.5 cm cm Ankle Left: Right: Point of Measurement: 10 cm From Medial Instep 17.5 cm cm Vascular Assessment Claudication: Claudication Assessment [Left:None] Pulses: Posterior Tibial Dorsalis Pedis Palpable: [Left:Yes] Extremity colors, hair growth, and conditions: Extremity Color: [Left:Normal] Hair Growth on Extremity: [Left:Yes] Temperature of Extremity: [Left:Warm] Capillary Refill: [Left:< 3 seconds] Toe Nail Assessment Left: Right: Thick: No Discolored: No Deformed: No Improper Length and Hygiene: No Electronic Signature(s) Signed: 06/25/2015 4:59:08 PM By: Regan Lemming BSN, RN Entered By: Regan Lemming on 06/25/2015 10:57:58 [name] ([name] ([id number]) -------------------------------------------------------------------------------- Multi Wound Chart Details Patient Name: [name] Date of Service: 06/25/2015 10:45 AM Medical Record Number: [id number] Patient Account Number: [id number] Date of Birth/Sex: [birth year] (74 y.o. Female) Treating RN: Baruch Gouty, RN, BSN, Velva Harman Primary Care Physician: Emily Filbert Other Clinician: Referring Physician: Emily Filbert Treating Physician/Extender: BURNS III, Charlean Sanfilippo in Treatment: 7 Vital Signs Height(in): 64 Pulse(bpm): 78 Weight(lbs): 88 Blood Pressure 106/70 (mmHg): Body Mass Index(BMI): 15 Temperature(F): 97.4 Respiratory Rate 18 (breaths/min): Photos: [2:No Photos] [N/A:N/A] Wound Location: [2:Left Lower Leg - Anterior] [N/A:N/A] Wounding Event: [2:Surgical Injury] [N/A:N/A] Primary Etiology: [2:Open Surgical Wound] [N/A:N/A] Comorbid History: [2:Cataracts, Rheumatoid Arthritis] [N/A:N/A] Date Acquired: [2:04/06/2015] [N/A:N/A] Weeks of Treatment: [  2:7] [N/A:N/A] Wound Status: [2:Open] [N/A:N/A] Measurements L x W x D 0.4x0.4x0.3 [N/A:N/A] (cm) Area (cm) : [2:0.126] [N/A:N/A] Volume (cm) : [2:0.038] [N/A:N/A] % Reduction in Area: [2:35.70%] [N/A:N/A] % Reduction in Volume: 35.60% [N/A:N/A] Classification: [2:Full Thickness Without Exposed Support Structures] [N/A:N/A] Exudate Amount: [2:Small] [N/A:N/A] Wound Margin: [2:Flat and Intact] [N/A:N/A] Granulation Amount: [2:Large (67-100%)] [N/A:N/A] Granulation Quality: [2:Red] [N/A:N/A] Necrotic Amount: [2:Small (1-33%)] [N/A:N/A] Exposed Structures: [2:Fascia: No Fat: No Tendon: No Muscle: No Joint: No Bone: No] [N/A:N/A] Limited to Skin Breakdown Epithelialization: None N/A N/A Periwound Skin Texture: Edema: No N/A N/A Excoriation: No Induration: No Callus: No Crepitus: No Fluctuance: No Friable: No Rash: No Scarring: No Periwound Skin Moist: Yes N/A N/A Moisture: Maceration: No Dry/Scaly: No Periwound Skin Color: Atrophie Blanche: No N/A N/A Cyanosis: No Ecchymosis: No Erythema: No Hemosiderin Staining: No Mottled:  No Pallor: No Rubor: No Temperature: No Abnormality N/A N/A Tenderness on Yes N/A N/A Palpation: Wound Preparation: Ulcer Cleansing: N/A N/A Rinsed/Irrigated with Saline Topical Anesthetic Applied: Other: lidocaine 4% Treatment Notes Electronic Signature(s) Signed: 06/25/2015 4:59:08 PM By: Regan Lemming BSN, RN Entered By: Regan Lemming on 06/25/2015 11:07:20 [name] ([id number]) -------------------------------------------------------------------------------- [place] Details Patient Name: [name] Date of Service: 06/25/2015 10:45 AM Medical Record Number: [id number] Patient Account Number: [id number] Date of Birth/Sex: [birth year] (74 y.o. Female) Treating RN: Afful, RN, BSN, Velva Harman Primary Care Physician: Emily Filbert Other Clinician: Referring Physician: Emily Filbert Treating Physician/Extender: BURNS III, Charlean Sanfilippo in Treatment: 7 Active Inactive Abuse / Safety / Falls / Self Care Management Nursing Diagnoses: Potential for falls Goals: Patient will not experience any injury related to fire Date Initiated: 05/07/2015 Goal Status: Active Interventions: Assess fall risk on admission and as needed Notes: Nutrition Nursing Diagnoses: Potential for alteratiion in Nutrition/Potential for imbalanced nutrition Goals: Patient/caregiver agrees to and verbalizes understanding of need to use nutritional supplements and/or vitamins as prescribed Date Initiated: 05/07/2015 Goal Status: Active Interventions: Provide education on nutrition Treatment Activities: Education provided on Nutrition : 05/14/2015 Notes: Orientation to the Wound Care Program Nursing Diagnoses: Knowledge deficit related to the wound healing center program [name], [name] ([id number]) Goals: Patient/caregiver will verbalize understanding of the [place] Program Date Initiated: 05/07/2015 Goal Status: Active Interventions: Provide education on  orientation to the wound center Notes: Wound/Skin Impairment Nursing Diagnoses: Impaired tissue integrity Knowledge deficit related to smoking impact on wound healing Knowledge deficit related to ulceration/compromised skin integrity Goals: Patient will demonstrate a reduced rate of smoking or cessation of smoking Date Initiated: 05/07/2015 Goal Status: Active Ulcer/skin breakdown will have a volume reduction of 30% by week 4 Date Initiated: 05/07/2015 Goal Status: Active Ulcer/skin breakdown will have a volume reduction of 50% by week 8 Date Initiated: 05/07/2015 Goal Status: Active Ulcer/skin breakdown will have a volume reduction of 80% by week 12 Date Initiated: 05/07/2015 Goal Status: Active Interventions: Assess patient/caregiver ability to obtain necessary supplies Assess patient/caregiver ability to perform ulcer/skin care regimen upon admission and as needed Notes: Electronic Signature(s) Signed: 06/25/2015 4:59:08 PM By: Regan Lemming BSN, RN Entered By: Regan Lemming on 06/25/2015 11:03:42 [name] ([id number]) -------------------------------------------------------------------------------- Pain Assessment Details Patient Name: [name] Date of Service: 06/25/2015 10:45 AM Medical Record Number: [id number] Patient Account Number: [id number] Date of Birth/Sex: March 20, 1941 (75 y.o. Female) Treating RN: Baruch Gouty, RN, BSN, Velva Harman Primary Care Physician: Emily Filbert Other Clinician: Referring Physician: Emily Filbert Treating Physician/Extender: BURNS III, Charlean Sanfilippo in Treatment: 7 Active Problems Location of Pain Severity and Description  of Pain Patient Has Paino No Site Locations Pain Management and Medication Current Pain Management: Electronic Signature(s) Signed: 06/25/2015 4:59:08 PM By: Regan Lemming BSN, RN Entered By: Regan Lemming on 06/25/2015 10:56:09 [name]  ([id number]) -------------------------------------------------------------------------------- Patient/Caregiver Education Details Patient Name: [name] Date of Service: 06/25/2015 10:45 AM Medical Record Number: [id number] Patient Account Number: [id number] Date of Birth/Gender: 12/07/1940 (75 y.o. Female) Treating RN: Baruch Gouty, RN, BSN, Velva Harman Primary Care Physician: Emily Filbert Other Clinician: Referring Physician: Emily Filbert Treating Physician/Extender: BURNS III, Charlean Sanfilippo in Treatment: 7 Education Assessment Education Provided To: Patient Education Topics Provided Nutrition: Methods: Explain/Verbal Responses: State content correctly Welcome To [facility]: Methods: Explain/Verbal Responses: State content correctly Electronic Signature(s) Signed: 06/25/2015 4:59:08 PM By: Regan Lemming BSN, RN Entered By: Regan Lemming on 06/25/2015 11:39:22 [name] ([id number]) -------------------------------------------------------------------------------- Wound Assessment Details Patient Name: [name] Date of Service: 06/25/2015 10:45 AM Medical Record Number: [id number] Patient Account Number: [id number] Date of Birth/Sex: [birth year] (74 y.o. Female) Treating RN: Afful, RN, BSN, [place] Primary Care Physician: Emily Filbert Other Clinician: Referring Physician: Emily Filbert Treating Physician/Extender: BURNS III, Charlean Sanfilippo in Treatment: 7 Wound Status Wound Number: 2 Primary Etiology: Open Surgical Wound Wound Location: Left Lower Leg - Anterior Wound Status: Open Wounding Event: Surgical Injury Comorbid History: Cataracts, Rheumatoid Arthritis Date Acquired: 04/06/2015 Weeks Of Treatment: 7 Clustered Wound: No Photos Photo Uploaded By: Regan Lemming on 06/25/2015 16:46:54 Wound Measurements Length: (cm) 0.4 Width: (cm) 0.4 Depth: (cm) 0.3 Area: (cm) 0.126 Volume: (cm) 0.038 % Reduction in Area: 35.7% % Reduction in Volume:  35.6% Epithelialization: None Tunneling: No Undermining: No Wound Description Full Thickness Without Exposed Classification: Support Structures Wound Margin: Flat and Intact Exudate Small Amount: Foul Odor After Cleansing: No Wound Bed Granulation Amount: Large (67-100%) Exposed Structure Granulation Quality: Red Fascia Exposed: No Necrotic Amount: Small (1-33%) Fat Layer Exposed: No Necrotic Quality: Adherent Slough Tendon Exposed: No Muscle Exposed: No [name], [name] ([id number]) Joint Exposed: No Bone Exposed: No Limited to Skin Breakdown Periwound Skin Texture Texture Color No Abnormalities Noted: No No Abnormalities Noted: No Callus: No Atrophie Blanche: No Crepitus: No Cyanosis: No Excoriation: No Ecchymosis: No Fluctuance: No Erythema: No Friable: No Hemosiderin Staining: No Induration: No Mottled: No Localized Edema: No Pallor: No Rash: No Rubor: No Scarring: No Temperature / Pain Moisture Temperature: No Abnormality No Abnormalities Noted: No Tenderness on Palpation: Yes Dry / Scaly: No Maceration: No Moist: Yes Wound Preparation Ulcer Cleansing: Rinsed/Irrigated with Saline Topical Anesthetic Applied: Other: lidocaine 4%, Treatment Notes Wound #2 (Left, Anterior Lower Leg) 1. Cleansed with: Clean wound with Normal Saline 4. Dressing Applied: Iodosorb Ointment 5. Secondary [place] 7. Secured with [employer]ig[employer] Electronic Signature(s) Signed: 06/25/2015 4:59:08 PM By: Regan Lemming BSN, RN Entered By: Regan Lemming on 06/25/2015 11:03:22 [name] ([id number]) -------------------------------------------------------------------------------- Vitals Details Patient Name: [name] Date of Service: 06/25/2015 10:45 AM Medical Record Number: [id number] Patient Account Number: [id number] Date of Birth/Sex: 1941-01-20 (75 y.o. Female) Treating RN: Afful, RN, BSN, [place] Primary Care Physician: Emily Filbert Other Clinician: Referring Physician: Emily Filbert Treating Physician/Extender: BURNS III, Charlean Sanfilippo in Treatment: 7 Vital Signs Time Taken: 10:56 Temperature (F): 97.4 Height (in): 64 Pulse (bpm): 78 Weight (lbs): 88 Respiratory Rate (breaths/min): 18 Body Mass Index (BMI): 15.1 Blood Pressure (mmHg): 106/70 Reference Range: 80 - 120 mg / dl Electronic Signature(s) Signed: 06/25/2015 4:59:08 PM By: Regan Lemming BSN, RN Entered By: Regan Lemming on 06/25/2015  10:57:23 

## 2015-06-26 NOTE — Progress Notes (Signed)
EDDY, JAIRAM ([id number]) Visit Report for 06/25/2015 Chief Complaint Document Details Patient Name: [name], [name] Date of Service: 06/25/2015 10:45 AM Medical Record Patient Account Number: [id number] Number: Treating RN: Baruch Gouty, RN, BSN, Rita June 01, 1941 [phone] y.o. Other Clinician: Date of Birth/Sex: Female) Treating BURNS III, Primary Care Physician: Emily Filbert Physician/Extender: Thayer Jew Referring Physician: Melina Modena in Treatment: 7 Information Obtained from: Patient Chief Complaint Left calf ulceration. Electronic Signature(s) Signed: 06/25/2015 11:51:43 AM By: Loletha Grayer MD Entered By: Loletha Grayer on 06/25/2015 11:21:46 [name] ([id number]) -------------------------------------------------------------------------------- Debridement Details Patient Name: [name] Date of Service: 06/25/2015 10:45 AM Medical Record Patient Account Number: [id number] Number: Treating RN: Baruch Gouty, RN, BSN, Rita 1941/01/20 [phone] y.o. Other Clinician: Date of Birth/Sex: Female) Treating BURNS III, Primary Care Physician: Emily Filbert Physician/Extender: Thayer Jew Referring Physician: Melina Modena in Treatment: 7 Debridement Performed for Wound #2 Left,Anterior Lower Leg Assessment: Performed By: Physician BURNS III, Teressa Senter., MD Debridement: Debridement Pre-procedure Yes Verification/Time Out Taken: Start Time: 11:07 Pain Control: Lidocaine 4% Topical Solution Level: Skin/Subcutaneous Tissue Total Area Debrided (L x 0.4 (cm) x 0.4 (cm) = 0.16 (cm) W): Tissue and other Non-Viable, Exudate, Fat, Fibrin/Slough, Subcutaneous material debrided: Instrument: Curette Bleeding: Minimum Hemostasis Achieved: Pressure End Time: 11:10 Procedural Pain: 0 Post Procedural Pain: 0 Response to Treatment: Procedure was tolerated well Post Debridement Measurements of Total Wound Length: (cm) 0.4 Width: (cm) 0.4 Depth: (cm)  0.4 Volume: (cm) 0.05 Post Procedure Diagnosis Same as Pre-procedure Electronic Signature(s) Signed: 06/25/2015 11:51:43 AM By: Loletha Grayer MD Signed: 06/25/2015 4:59:08 PM By: Regan Lemming BSN, RN Entered By: Loletha Grayer on 06/25/2015 11:21:33 [name] ([id number]) -------------------------------------------------------------------------------- HPI Details Patient Name: [name] Date of Service: 06/25/2015 10:45 AM Medical Record Patient Account Number: [id number] Number: Treating RN: Baruch Gouty, RN, BSN, Rita 1940-11-25 (75 y.o. Other Clinician: Date of Birth/Sex: Female) Treating BURNS III, Primary Care Physician: Emily Filbert Physician/Extender: Thayer Jew Referring Physician: Melina Modena in Treatment: 7 History of Present Illness HPI Description: Pleasant 75 year old with history of rheumatoid arthritis, smoking, and COPD. No history of diabetes or PAD. Left ABI 1.2. 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 Pathology showed positive margins. Underwent wide reexcision on 02/03/2015. Pathology showed no residual carcinoma. The wound was closed primarily but dehisced. She developed localized edema and was treated with Unna boots without significant improvement. Completed a course of doxycycline. Plastic surgery consultation requested for consideration of wide reexcision and primary closure. Performing dressing changes with Promogran. Using a Tubigrip for edema control. Cannot apply compression stockings secondary to weakness in her hands. She returns to clinic for follow-up and is without new complaints. She denies any significant pain. No fever or chills. No significant drainage. Electronic Signature(s) Signed: 06/25/2015 11:51:43 AM By: Loletha Grayer MD Entered By: Loletha Grayer on 06/25/2015 11:23:01 [name]  ([id number]) -------------------------------------------------------------------------------- Physical Exam Details Patient Name: [name] Date of Service: 06/25/2015 10:45 AM Medical Record Patient Account Number: [id number] Number: Treating RN: Baruch Gouty, RN, BSN, Rita 29-May-1941 [phone] y.o. Other Clinician: Date of Birth/Sex: Female) Treating BURNS III, Primary Care Physician: Emily Filbert Physician/Extender: Thayer Jew Referring Physician: Melina Modena in Treatment: 7 Constitutional . Pulse regular. Respirations normal and unlabored. Afebrile. . Notes Left anterior calf ulceration unchanged. Full thickness. No exposed deep structures. No evidence for infection. Minimal edema. Palpable DP. L ABI 1.2. Electronic Signature(s) Signed: 06/25/2015 11:51:43 AM  By: Loletha Grayer MD Entered By: Loletha Grayer on 06/25/2015 11:23:37 [name] ([id number]) -------------------------------------------------------------------------------- Physician Orders Details Patient Name: [name] Date of Service: 06/25/2015 10:45 AM Medical Record Patient Account Number: [id number] Number: Treating RN: Baruch Gouty, RN, BSN, Rita 04-04-1941 [phone] y.o. Other Clinician: Date of Birth/Sex: Female) Treating BURNS III, Primary Care Physician: Emily Filbert Physician/Extender: Thayer Jew Referring Physician: Melina Modena in Treatment: 7 Verbal / Phone Orders: Yes Clinician: Afful, RN, BSN, Rita Read Back and Verified: Yes Diagnosis Coding Wound Cleansing Wound #2 Left,Anterior Lower Leg o Cleanse wound with mild soap and water o May Shower, gently pat wound dry prior to applying new dressing. Anesthetic Wound #2 Left,Anterior Lower Leg o Topical Lidocaine 4% cream applied to wound bed prior to debridement Primary Wound Dressing Wound #2 Left,Anterior Lower Leg o Iodosorb Ointment Secondary Dressing Wound #2 Left,Anterior Lower Leg o  Non-adherent pad Dressing Change Frequency Wound #2 Left,Anterior Lower Leg o Change dressing every day. Follow-up Appointments Wound #2 Left,Anterior Lower Leg o Return Appointment in 2 weeks. Edema Control Wound #2 Left,Anterior Lower Leg o Tubigrip Electronic Signature(s) Signed: 06/25/2015 11:51:43 AM By: Loletha Grayer MD Signed: 06/25/2015 4:59:08 PM By: Regan Lemming BSN, RN [name] ([id number]) Entered By: Regan Lemming on 06/25/2015 11:11:52 [name] ([id number]) -------------------------------------------------------------------------------- Problem List Details Patient Name: [name] Date of Service: 06/25/2015 10:45 AM Medical Record Patient Account Number: [id number] Number: Treating RN: Baruch Gouty, RN, BSN, Rita 1940/09/19 [phone] y.o. 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 T81.31XS Disruption of external operation (surgical) wound, not 05/07/2015 Yes elsewhere classified, sequela M06.9 Rheumatoid arthritis, unspecified 05/07/2015 Yes J44.9 Chronic obstructive pulmonary disease, unspecified 05/07/2015 Yes Inactive Problems Resolved Problems Electronic Signature(s) Signed: 06/25/2015 11:51:43 AM By: Loletha Grayer MD Entered By: Loletha Grayer on 06/25/2015 11:21:06 [name] ([id number]) -------------------------------------------------------------------------------- Progress Note Details Patient Name: [name] Date of Service: 06/25/2015 10:45 AM Medical Record Patient Account Number: [id number] Number: Treating RN: Baruch Gouty, RN, BSN, Rita 1941/06/04 (75 y.o. Other Clinician: Date of  Birth/Sex: Female) Treating BURNS III, Primary Care Physician: Emily Filbert Physician/Extender: Thayer Jew Referring Physician: Melina Modena in Treatment: 7 Subjective Chief Complaint Information obtained from Patient Left calf ulceration. History of Present Illness (HPI) Pleasant 75 year old with history of rheumatoid arthritis, smoking, and COPD. No history of diabetes or PAD. Left ABI 1.2. 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 Pathology showed positive margins. Underwent wide reexcision on 02/03/2015. Pathology showed no residual carcinoma. The wound was closed primarily but dehisced. She developed localized edema and was treated with Unna boots without significant improvement. Completed a course of doxycycline. Plastic surgery consultation requested for consideration of wide reexcision and primary closure. Too small (<1sq cm) for skin substitute per Medicare. Performing dressing changes with Promogran. Using a Tubigrip for edema control. Cannot apply compression stockings secondary to weakness in her hands. She returns to clinic for follow-up and is without new complaints. She denies any significant pain. No fever or chills. No significant drainage. Objective Constitutional Pulse regular. Respirations normal and unlabored. Afebrile. Vitals Time Taken: 10:56 AM, Height: 64 in, Weight: 88 lbs, BMI: 15.1, Temperature: 97.4 F, Pulse: 78 bpm, Respiratory Rate: 18 breaths/min,  Blood Pressure: 106/70 mmHg. General Notes: Left anterior calf ulceration unchanged. Full thickness. No exposed deep structures. No [name], [name] ([id number]) evidence for infection. Minimal edema. Palpable DP. L ABI 1.2. Integumentary (Hair, Skin) Wound #2 status is Open. Original cause of wound was Surgical Injury. The wound is located on the Left,Anterior Lower Leg. The wound measures 0.4cm length x 0.4cm width x 0.3cm depth; 0.126cm^2 area and  0.038cm^3 volume. The wound is limited to skin breakdown. There is no tunneling or undermining noted. There is a small amount of drainage noted. The wound margin is flat and intact. There is large (67- 100%) red granulation within the wound bed. There is a small (1-33%) amount of necrotic tissue within the wound bed including Adherent Slough. The periwound skin appearance exhibited: Moist. The periwound skin appearance did not exhibit: Callus, Crepitus, Excoriation, Fluctuance, Friable, Induration, Localized Edema, Rash, Scarring, Dry/Scaly, Maceration, Atrophie Blanche, Cyanosis, Ecchymosis, Hemosiderin Staining, Mottled, Pallor, Rubor, Erythema. Periwound temperature was noted as No Abnormality. The periwound has tenderness on palpation. Assessment Active Problems ICD-10 [id number] - Non-pressure chronic ulcer of left calf with fat layer exposed T81.31XS - Disruption of external operation (surgical) wound, not elsewhere classified, sequela M06.9 - Rheumatoid arthritis, unspecified J44.9 - Chronic obstructive pulmonary disease, unspecified Chronic left calf ulceration, status post excision of squamous cell carcinoma, ultimately with negative margins. Procedures Wound #2 Wound #2 is an Open Surgical Wound located on the Left,Anterior Lower Leg . There was a Skin/Subcutaneous Tissue Debridement [id number]) debridement with total area of 0.16 sq cm performed by BURNS III, Teressa Senter., MD. with the following instrument(s): Curette to remove Non-Viable tissue/material including Exudate, Fat, Fibrin/Slough, and Subcutaneous after achieving pain control using Lidocaine 4% Topical Solution. A time out was conducted prior to the start of the procedure. A Minimum amount of bleeding was controlled with Pressure. The procedure was tolerated well with a pain level of 0 throughout and a pain level of 0 following the procedure. Post Debridement Measurements: 0.4cm length x 0.4cm width x 0.4cm depth;  0.05cm^3 volume. Post procedure Diagnosis Wound #2: Same as Pre-Procedure [name], [name] ([id number]) Plan Wound Cleansing: Wound #2 Left,Anterior Lower Leg: Cleanse wound with mild soap and water May Shower, gently pat wound dry prior to applying new dressing. Anesthetic: Wound #2 Left,Anterior Lower Leg: Topical Lidocaine 4% cream applied to wound bed prior to debridement Primary Wound Dressing: Wound #2 Left,Anterior Lower Leg: Iodosorb Ointment Secondary Dressing: Wound #2 Left,Anterior Lower Leg: Non-adherent pad Dressing Change Frequency: Wound #2 Left,Anterior Lower Leg: Change dressing every day. Follow-up Appointments: Wound #2 Left,Anterior Lower Leg: Return Appointment in 2 weeks. Edema Control: Wound #2 Left,Anterior Lower Leg: Tubigrip Switch to cadexomer iodine. Tubigrip for edema control. If no significant improvement will reconsult plastic surgery for consideration of wide reexcision and primary closure. Electronic Signature(s) Signed: 06/25/2015 11:51:43 AM By: Loletha Grayer MD Entered By: Loletha Grayer on 06/25/2015 11:25:45 [name] ([id number]) -------------------------------------------------------------------------------- SuperBill Details Patient Name: [name] Date of Service: 06/25/2015 Medical Record Patient Account Number: [id number] Number: Treating RN: Baruch Gouty, RN, BSN, Rita 09-13-1940 [phone] y.o. Other Clinician: Date of Birth/Sex: Female) Treating BURNS III, Primary Care Physician: Emily Filbert Physician/Extender: Thayer Jew Referring Physician: Melina Modena in Treatment: 7 Diagnosis Coding ICD-10 Codes Code Description [phone] Non-pressure chronic ulcer of left calf with fat layer exposed T81.31XS Disruption of external operation (surgical) wound, not elsewhere classified, sequela M06.9 Rheumatoid arthritis, unspecified J44.9 Chronic obstructive pulmonary disease, unspecified Facility  Procedures CPT4 Code:  [id number] Description: [id number] - DEB SUBQ TISSUE 20 SQ CM/< ICD-10 Description Diagnosis L97.222 Non-pressure chronic ulcer of left calf with fat l Modifier: ayer exposed Quantity: 1 Physician Procedures CPT4 Code: [id number] Description: [id number] - WC PHYS SUBQ TISS 20 SQ CM ICD-10 Description Diagnosis L97.222 Non-pressure chronic ulcer of left calf with fat l Modifier: ayer exposed Quantity: 1 Electronic Signature(s) Signed: 06/25/2015 11:51:43 AM By: Loletha Grayer MD Entered By: Loletha Grayer on 06/25/2015 11:25:57

## 2015-07-08 ENCOUNTER — Emergency Department: Payer: Medicare Other

## 2015-07-08 ENCOUNTER — Encounter: Payer: Self-pay | Admitting: Emergency Medicine

## 2015-07-08 ENCOUNTER — Emergency Department
Admission: EM | Admit: 2015-07-08 | Discharge: 2015-07-08 | Disposition: A | Discharge status: Home / Self Care | Payer: Medicare Other | Attending: Student | Admitting: Student

## 2015-07-08 DIAGNOSIS — Z79899 Other long term (current) drug therapy: Secondary | ICD-10-CM | POA: Insufficient documentation

## 2015-07-08 DIAGNOSIS — K5901 Slow transit constipation: Secondary | ICD-10-CM

## 2015-07-08 DIAGNOSIS — F17210 Nicotine dependence, cigarettes, uncomplicated: Secondary | ICD-10-CM | POA: Insufficient documentation

## 2015-07-08 DIAGNOSIS — R109 Unspecified abdominal pain: Secondary | ICD-10-CM | POA: Diagnosis present

## 2015-07-08 DIAGNOSIS — G8929 Other chronic pain: Secondary | ICD-10-CM | POA: Diagnosis not present

## 2015-07-08 DIAGNOSIS — R1030 Lower abdominal pain, unspecified: Secondary | ICD-10-CM

## 2015-07-08 LAB — CBC WITH DIFFERENTIAL/PLATELET
BASOS ABS: 0 10*3/uL (ref 0–0.1)
BASOS PCT: 1 %
EOS ABS: 0.1 10*3/uL (ref 0–0.7)
EOS PCT: 1 %
HCT: 35.8 % (ref 35.0–47.0)
Hemoglobin: 12.7 g/dL (ref 12.0–16.0)
Lymphocytes Relative: 13 %
Lymphs Abs: 1 10*3/uL (ref 1.0–3.6)
MCH: 32.7 pg (ref 26.0–34.0)
MCHC: 35.4 g/dL (ref 32.0–36.0)
MCV: 92.4 fL (ref 80.0–100.0)
Monocytes Absolute: 0.6 10*3/uL (ref 0.2–0.9)
Monocytes Relative: 8 %
Neutro Abs: 5.8 10*3/uL (ref 1.4–6.5)
Neutrophils Relative %: 77 %
PLATELETS: 332 10*3/uL (ref 150–440)
RBC: 3.88 MIL/uL (ref 3.80–5.20)
RDW: 14.7 % — ABNORMAL HIGH (ref 11.5–14.5)
WBC: 7.5 10*3/uL (ref 3.6–11.0)

## 2015-07-08 LAB — TROPONIN I
Troponin I: 0.04 ng/mL — ABNORMAL HIGH (ref <0.031)
Troponin I: <0.03 ng/mL (ref <0.031)

## 2015-07-08 LAB — COMPREHENSIVE METABOLIC PANEL
ALT: 28 U/L (ref 14–54)
AST: 34 U/L (ref 15–41)
Albumin: 3.7 g/dL (ref 3.5–5.0)
Alkaline Phosphatase: 62 U/L (ref 38–126)
Anion gap: 11 (ref 5–15)
BUN: 13 mg/dL (ref 6–20)
CHLORIDE: 98 mmol/L — AB (ref 101–111)
CO2: 20 mmol/L — AB (ref 22–32)
CREATININE: 0.64 mg/dL (ref 0.44–1.00)
Calcium: 8.8 mg/dL — ABNORMAL LOW (ref 8.9–10.3)
GFR calc non Af Amer: >60 mL/min (ref >60)
Glucose, Bld: 92 mg/dL (ref 65–99)
Potassium: 3.9 mmol/L (ref 3.5–5.1)
SODIUM: 129 mmol/L — AB (ref 135–145)
Total Bilirubin: 1 mg/dL (ref 0.3–1.2)
Total Protein: 5.9 g/dL — ABNORMAL LOW (ref 6.5–8.1)

## 2015-07-08 LAB — LIPASE, BLOOD: Lipase: 25 U/L (ref 11–51)

## 2015-07-08 MED ORDER — IOHEXOL 240 MG/ML SOLN
25.0000 mL | Freq: Once | INTRAMUSCULAR | Status: AC | PRN
  Administered 2015-07-08: 25 mL via ORAL

## 2015-07-08 MED ORDER — SODIUM CHLORIDE 0.9 % IV BOLUS (SEPSIS)
500.0000 mL | Freq: Once | INTRAVENOUS | Status: AC
  Administered 2015-07-08: 500 mL via INTRAVENOUS

## 2015-07-08 MED ORDER — POLYETHYLENE GLYCOL 3350 17 GM/SCOOP PO POWD: ORAL | Status: DC

## 2015-07-08 MED ORDER — IOHEXOL 300 MG/ML  SOLN
100.0000 mL | Freq: Once | INTRAMUSCULAR | Status: AC | PRN
  Administered 2015-07-08: 88 mL via INTRAVENOUS

## 2015-07-08 MED ORDER — ONDANSETRON HCL 4 MG/2ML IJ SOLN
4.0000 mg | Freq: Once | INTRAMUSCULAR | Status: AC
  Administered 2015-07-08: 4 mg via INTRAVENOUS
  Filled 2015-07-08: qty 2

## 2015-07-08 MED ORDER — ALPRAZOLAM 0.5 MG PO TABS
0.5000 mg | ORAL_TABLET | Freq: Once | ORAL | Status: AC
  Administered 2015-07-08: 0.5 mg via ORAL
  Filled 2015-07-08: qty 1

## 2015-07-08 NOTE — ED Provider Notes (Signed)
[facility] Emergency Department Provider Note  ____________________________________________  Time seen: Approximately 9:52 AM  I have reviewed the triage vital signs and the nursing notes.   HISTORY  Chief Complaint Abdominal Pain    HPI [name] is a 75 y.o. female with rheumatoid arthritis, COPD, anxiety, chronic abdominal pain and irritable bowel syndrome who presents for evaluation of 2-3 days nausea and abdominal pain, gradual onset, constant since onset. The patient reports that she often has bouts of this kind of pain. It begins in the lower abdomen and often response to her "massaging my colon or sometimes an enema helps". She has been passing flatus yesterday but not today. No vomiting. No fevers. Yesterday she briefly had some "pain between my breast". But that resolved quickly. She is complaining of anxiety. She is followed by GI for chronic abdominal discomfort as well as weight loss and constipation.   Past Medical History  Diagnosis Date  . Arthritis, rheumatoid ([place])   . Thyroid binding globulin deficiency   . Breast pain   . GERD (gastroesophageal reflux disease)   . Chest pain   . Tobacco abuse   . Chest discomfort   . Abdominal pain   . Hypothyroidism   . Renal artery stenosis ([place])   . COPD (chronic obstructive pulmonary disease) ([place])   . Hx of colonic polyps   . Anxiety   . Chronic pain syndrome   . Hemorrhoids   . Gastritis   . Diverticulosis   . Fibrocystic breast disease   . Osteopenia   . Constipation   . Right ankle pain   . Collagen vascular disease [facility])     Patient Active Problem List   Diagnosis Date Noted  . RA (rheumatoid arthritis) ([place]) 02/18/2015  . Arthritis, rheumatoid ([place])   . Thyroid binding globulin deficiency   . Breast pain   . GERD (gastroesophageal reflux disease)   . Tobacco abuse   . Chest discomfort   . Abdominal pain   . Hypothyroidism   . Renal artery stenosis ([place])   . COPD  (chronic obstructive pulmonary disease) ([place])   . Hx of colonic polyps   . Anxiety   . Hemorrhoids   . Gastritis   . Diverticulosis   . Fibrocystic breast disease   . Osteopenia   . Constipation   . CHRONIC PAIN SYNDROME 08/03/2007    Past Surgical History  Procedure Laterality Date  . Cholecystectomy    . Tonsillectomy    . Leg surgery      Current Outpatient Rx  Name  Route  Sig  Dispense  Refill  . acidophilus (RISAQUAD) CAPS capsule   Oral   Take 1 capsule by mouth daily.         Marland Kitchen ALPRAZolam (XANAX) 0.5 MG tablet   Oral   Take 0.5 mg by mouth 2 (two) times daily as needed.          . Calcium Carbonate-Vit D-Min (CALCIUM 1200 PO)   Oral   Take 2 tablets by mouth daily.         . cefUROXime (CEFTIN) 250 MG tablet   Oral   Take 1 tablet by mouth 2 (two) times daily.         . cyanocobalamin (,VITAMIN B-12,) 1000 MCG/ML injection   Intramuscular   Inject 1,000 mcg into the muscle every 14 (fourteen) days. Pt gets every 6 weeks         . cyclobenzaprine (FLEXERIL) 10 MG tablet  Oral   Take 1 tablet by mouth 3 (three) times daily as needed.         . fluticasone (FLONASE) 50 MCG/ACT nasal spray   Nasal   Place 2 sprays into the nose daily.         . folic acid (FOLVITE) 1 MG tablet   Oral   Take 1 tablet by mouth daily.         . hydroxychloroquine (PLAQUENIL) 200 MG tablet      TAKE ONE TABLET TWICE DAILY         . levothyroxine (SYNTHROID, LEVOTHROID) 88 MCG tablet   Oral   Take 88 mcg by mouth daily.         . Magnesium 250 MG TABS   Oral   Take 1 tablet by mouth daily.         . Methotrexate Sodium (METHOTREXATE, PF,) 200 MG/8ML injection      ADMINISTER 0.8MLS ONCE A WEEK         . Multiple Vitamins-Minerals (MULTIVITAMIN WITH MINERALS) tablet   Oral   Take 1 tablet by mouth daily.         Marland Kitchen omeprazole (PRILOSEC) 40 MG capsule   Oral   Take 1 capsule by mouth daily.         Marland Kitchen senna (SENOKOT) 8.6 MG tablet    Oral   Take 16 mg by mouth.         . vitamin C (ASCORBIC ACID) 500 MG tablet   Oral   Take 500 mg by mouth daily.         Marland Kitchen VITAMIN D, CHOLECALCIFEROL, PO   Oral   Take 1 tablet by mouth daily.         Marland Kitchen omeprazole (PRILOSEC) 20 MG capsule   Oral   Take 20 mg by mouth.           Allergies Etrafon; Hydrocodone; Mirtazapine; Doxycycline; and Percocet  Family History  Problem Relation Age of Onset  . Heart disease Sister   . Hypertension Brother   . Heart attack Brother   . Hyperlipidemia Brother     Social History Social History  Substance Use Topics  . Smoking status: Current Every Day Smoker -- 1.00 packs/day    Types: Cigarettes  . Smokeless tobacco: Current User  . Alcohol Use: No    Review of Systems Constitutional: No fever/chills Eyes: No visual changes. ENT: No sore throat. Cardiovascular: Denies chest pain. Respiratory: Denies shortness of breath. Gastrointestinal: +abdominal pain.  + nausea, no vomiting.  No diarrhea.  No constipation. Genitourinary: Negative for dysuria. Musculoskeletal: Negative for back pain. Skin: Negative for rash. Neurological: Negative for headaches, focal weakness or numbness.  10-point ROS otherwise negative.  ____________________________________________   PHYSICAL EXAM:  VITAL SIGNS: ED Triage Vitals  Enc Vitals Group     BP 07/08/15 0948 102/79 mmHg     Pulse Rate 07/08/15 0948 88     Resp 07/08/15 0948 20     Temp 07/08/15 0948 97.7 F (36.5 C)     Temp Source 07/08/15 0948 Oral     SpO2 07/08/15 0948 100 %     Weight 07/08/15 0948 88 lb (39.917 kg)     Height 07/08/15 0948 5\' 4"  (1.626 m)     Head Cir --      Peak Flow --      Pain Score 07/08/15 0941 8     Pain Loc --  Pain Edu? --      Excl. in [place]? --     Constitutional: Alert and oriented. Nontoxic-appearing but tearful. Eyes: Conjunctivae are normal. PERRL. EOMI. Head: Atraumatic. Nose: No congestion/rhinnorhea. Mouth/Throat: Mucous  membranes are moist.  Oropharynx non-erythematous. Neck: No stridor.   Cardiovascular: Normal rate, regular rhythm. Grossly normal heart sounds.  Good peripheral circulation. Respiratory: Normal respiratory effort.  No retractions. Lungs CTAB. Gastrointestinal: Soft with faint tenderness to palpation, worse in the right lower quadrant. No CVA tenderness. Genitourinary: deferred Musculoskeletal: No lower extremity tenderness nor edema.  No joint effusions. Neurologic:  Normal speech and language. No gross focal neurologic deficits are appreciated. No gait instability. Skin:  Skin is warm, dry and intact. No rash noted. Psychiatric: Mood and affect are normal. Speech and behavior are normal.  ____________________________________________   LABS (all labs ordered are listed, but only abnormal results are displayed)  Labs Reviewed  CBC WITH DIFFERENTIAL/PLATELET - Abnormal; Notable for the following:    RDW 14.7 (*)    All other components within normal limits  COMPREHENSIVE METABOLIC PANEL - Abnormal; Notable for the following:    Sodium 129 (*)    Chloride 98 (*)    CO2 20 (*)    Calcium 8.8 (*)    Total Protein 5.9 (*)    All other components within normal limits  TROPONIN I - Abnormal; Notable for the following:    Troponin I 0.04 (*)    All other components within normal limits  LIPASE, BLOOD  TROPONIN I   ____________________________________________  EKG  ED ECG REPORT I, Joanne Gavel, the attending physician, personally viewed and interpreted this ECG.   Date: 07/08/2015  EKG Time: 11:12  Rate: 75  Rhythm: normal sinus rhythm  Axis: normal  Intervals:none  ST&T Change: No acute ST elevation.  ____________________________________________  RADIOLOGY  CT abdomen and pelvis IMPRESSION: 1. There is no evidence of acute inflammatory process within abdomen or pelvis. 2. Moderate stool noted within redundant transverse colon. Moderate stool noted in splenic  flexure of the colon. 3. Colonic diverticula are noted left colon and proximal sigmoid colon. No evidence of acute diverticulitis or colitis. No distal colonic obstruction. 4. No pericecal inflammation. The appendix is not identified. 5. No small bowel obstruction. 6. No hydronephrosis or hydroureter. 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 Stable small hepatic cysts. Stable tiny left renal cyst 7. Lumbar spine osteopenia. Mild degenerative changes lumbar spine. ____________________________________________   PROCEDURES  Procedure(s) performed: None  Critical Care performed: No  ____________________________________________   INITIAL IMPRESSION / ASSESSMENT AND PLAN / ED COURSE  Pertinent labs & imaging results that were available during my care of the patient were reviewed by me and considered in my medical decision making (see chart for details).  [name] is a 75 y.o. female with rheumatoid arthritis, COPD, anxiety, chronic abdominal pain and irritable bowel syndrome who presents for evaluation of 2-3 days nausea and abdominal pain, gradual onset, constant since onset. On exam, she is tearful intermittently and appears mildly anxious. Vital signs are stable, she is afebrile. She has some tenderness throughout the lower abdomen, worse in the right lower quadrant. I have offered pain medication however she reports that she would like a Xanax instead. She is prescribed Xanax "for my nerves". Plan for screening labs, CT of the abdomen and pelvis. Reassess for disposition.  ----------------------------------------- 5:02 PM on 07/08/2015 ----------------------------------------- Patient with initial troponin of 0.04 however on recheck greater than 3 hours later it has  normalized, is less than 0.03. EKG not consistent with acute ischemia, I doubt  ACS. Not consistent with PE or acute aortic dissection. CBC unremarkable. CMP notable for chronic stable  hyponatremia with sodium 129.  Normal lipase. CT abdomen and pelvis shows moderate stool burden. The patient reports that typically when she has an episode like the one she is having now, she typically requires an enema after which she feels much better. She has had an enema in the emergency department with passage of a small bowel movements. She appears well at this time and she appears much more comfortable. Discussed that we would send her out with MiraLAX, we discussed return precautions and need for close PCP follow-up and she is comfortable with the discharge plan. She denies any urinary complaints, reports the symptoms today are consistent with her usual constipation complaints and I canceled urinalysis.  ____________________________________________   FINAL CLINICAL IMPRESSION(S) / ED DIAGNOSES  Final diagnoses:  Lower abdominal pain  Slow transit constipation      Joanne Gavel, MD 07/08/15 1707

## 2015-07-08 NOTE — ED Notes (Deleted)
Dr. Edd Fabian notified of critical trop of 0.05

## 2015-07-08 NOTE — ED Notes (Deleted)
Patient transported to CT 

## 2015-07-08 NOTE — ED Notes (Signed)
Pt to ed with c/o lower abd pain since yesterday.  Denies vomiting, denies diarrhea.

## 2015-07-08 NOTE — ED Notes (Signed)
Pt has hx of IBS, states she ate some rasin brand cereal and has been nauseas ever since, denies any vomiting or diaherra, pt states she feels weak, pt tearful and states "I am weak and my nerves are bad"

## 2015-07-09 ENCOUNTER — Encounter: Payer: Medicare Other | Attending: Internal Medicine | Admitting: Internal Medicine

## 2015-07-09 DIAGNOSIS — J449 Chronic obstructive pulmonary disease, unspecified: Secondary | ICD-10-CM | POA: Insufficient documentation

## 2015-07-09 DIAGNOSIS — T8131XS Disruption of external operation (surgical) wound, not elsewhere classified, sequela: Secondary | ICD-10-CM | POA: Diagnosis not present

## 2015-07-09 DIAGNOSIS — M069 Rheumatoid arthritis, unspecified: Secondary | ICD-10-CM | POA: Diagnosis not present

## 2015-07-09 DIAGNOSIS — Y839 Surgical procedure, unspecified as the cause of abnormal reaction of the patient, or of later complication, without mention of misadventure at the time of the procedure: Secondary | ICD-10-CM | POA: Diagnosis not present

## 2015-07-09 DIAGNOSIS — L97222 Non-pressure chronic ulcer of left calf with fat layer exposed: Secondary | ICD-10-CM | POA: Diagnosis present

## 2015-07-11 NOTE — Progress Notes (Addendum)
[name] ([id number]) Visit Report for 07/09/2015 Chief Complaint Document Details Patient Name: [name] Date of Service: 07/09/2015 12:45 PM Medical Record Patient Account Number: [id number] Number: Treating RN: Ahmed Prima [birth year] (74 y.o. Other Clinician: Date of Birth/Sex: Female) Treating Britnay Magnussen Primary Care Physician/Extender: Claudette Laws Physician: Referring Physician: Melina Modena in Treatment: 9 Information Obtained from: Patient Chief Complaint Left calf ulceration. Electronic Signature(s) Signed: 07/22/2015 10:58:38 AM By: Sharon Mt Signed: 08/20/2015 4:47:02 PM By: Linton Ham MD Previous Signature: 07/09/2015 4:36:57 PM Version By: Linton Ham MD Previous Signature: 07/09/2015 5:10:35 PM Version By: Christin Fudge MD, FACS Entered By: Sharon Mt on 07/22/2015 10:58:37 [name] ([id number]) -------------------------------------------------------------------------------- Debridement Details Patient Name: [name] Date of Service: 07/09/2015 12:45 PM Medical Record Number: [id number] Patient Account Number: [id number] Date of Birth/Sex: May 25, 1941 (75 y.o. Female) Treating RN: Ahmed Prima Primary Care Physician: Emily Filbert Other Clinician: Referring Physician: Emily Filbert Treating Physician/Extender: Frann Rider in Treatment: 9 Debridement Performed for Wound #2 Left,Anterior Lower Leg Assessment: Performed By: Physician Christin Fudge, MD Debridement: Open Wound/Selective Debridement Selective Description: Pre-procedure Yes Verification/Time Out Taken: Start Time: 13:15 Pain Control: Other : lidocaine 4% cream Total Area Debrided (L x 0.5 (cm) x 0.3 (cm) = 0.15 (cm) W): Tissue and other Viable, Non-Viable, Exudate, Fibrin/Slough, Subcutaneous material debrided: Instrument: Blade Bleeding: None End Time: 13:18 Procedural Pain: 0 Post Procedural Pain: 0 Response to  Treatment: Procedure was tolerated well Post Debridement Measurements of Total Wound Length: (cm) 0.5 Width: (cm) 0.3 Depth: (cm) 0.3 Volume: (cm) 0.035 Post Procedure Diagnosis Same as Pre-procedure Electronic Signature(s) Signed: 07/09/2015 4:36:57 PM By: Linton Ham MD Signed: 07/09/2015 5:03:09 PM By: Alric Quan Signed: 07/09/2015 5:10:35 PM By: Christin Fudge MD, FACS Entered By: Linton Ham on 07/09/2015 13:24:06 [name] ([id number]) -------------------------------------------------------------------------------- HPI Details Patient Name: [name] Date of Service: 07/09/2015 12:45 PM Medical Record Patient Account Number: [id number] Number: Treating RN: Ahmed Prima 1941/01/01 (74 y.o. Other Clinician: Date of Birth/Sex: Female) Treating Jenasia Dolinar Primary Care Physician/Extender: Claudette Laws Physician: Referring Physician: Melina Modena in Treatment: 9 History of Present Illness HPI Description: Pleasant 75 year old with history of rheumatoid arthritis, smoking, and COPD. No history of diabetes or PAD. Left ABI 1.2. 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 Pathology showed positive margins. Underwent wide reexcision on 02/03/2015. Pathology showed no residual carcinoma. The wound was closed primarily but dehisced. She developed localized edema and was treated with Unna boots without significant improvement. Completed a course of doxycycline. Plastic surgery consultation requested for consideration of wide reexcision and primary closure. Performing dressing changes with Promogran. Using a Tubigrip for edema control. Cannot apply compression stockings secondary to weakness in her hands. She returns to clinic for follow-up and is without new complaints. She denies any significant pain. No fever or chills. No significant drainage. 07/09/15; this is a lady who came to [place] for  wound on the left anterior leg that was initially and excision site of squamous oh carcinoma. She underwent a wide excision in August. A lot of this is already healed she is been left with a very tiny open area but perhaps with 0.3 mm of depth she does not have any arterial issues no edema Electronic Signature(s) Signed: 07/22/2015 10:58:48 AM By: Sharon Mt Signed: 08/20/2015 4:47:02 PM By: Linton Ham MD Previous Signature: 07/09/2015 4:36:57 PM Version By: Linton Ham MD Previous Signature: 07/09/2015 5:10:35  PM Version By: Christin Fudge MD, FACS Entered By: Sharon Mt on 07/22/2015 10:58:48 [name] ([id number]) -------------------------------------------------------------------------------- Physical Exam Details Patient Name: [name] Date of Service: 07/09/2015 12:45 PM Medical Record Patient Account Number: [id number] Number: Treating RN: Ahmed Prima 07-Oct-1940 (74 y.o. Other Clinician: Date of Birth/Sex: Female) Treating Daisi Kentner Primary Care Physician/Extender: Claudette Laws Physician: Referring Physician: Melina Modena in Treatment: 9 Notes Wound exam; tiny open area about was some depth. This was divided to remove circumferential callus, nonviable skin. There is no evidence of infection she does not have arterial issues. ABI was 1.2 Electronic Signature(s) Signed: 07/09/2015 4:36:57 PM By: Linton Ham MD Entered By: Linton Ham on 07/09/2015 13:25:38 [name] ([id number]) -------------------------------------------------------------------------------- Physician Orders Details Patient Name: [name] Date of Service: 07/09/2015 12:45 PM Medical Record Patient Account Number: [id number] Number: Treating RN: Ahmed Prima 10-21-1940 (74 y.o. Other Clinician: Date of Birth/Sex: Female) Treating Terrilynn Postell Primary Care Physician/Extender: Claudette Laws Physician: Referring  Physician: Melina Modena in Treatment: 9 Verbal / Phone Orders: Yes Clinician: Carolyne Fiscal, Debi Read Back and Verified: Yes Diagnosis Coding Electronic Signature(s) Signed: 07/22/2015 10:58:04 AM By: Sharon Mt Signed: 08/20/2015 4:47:02 PM By: Linton Ham MD Previous Signature: 07/09/2015 5:03:09 PM Version By: Alric Quan Previous Signature: 07/09/2015 5:10:35 PM Version By: Christin Fudge MD, FACS Entered By: Sharon Mt on 07/22/2015 10:58:03 [name] ([id number]) -------------------------------------------------------------------------------- Problem List Details Patient Name: [name] Date of Service: 07/09/2015 12:45 PM Medical Record Patient Account Number: [id number] Number: Treating RN: Ahmed Prima 1940-06-20 (74 y.o. 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 T81.31XS Disruption of external operation (surgical) wound, not 05/07/2015 Yes elsewhere classified, sequela M06.9 Rheumatoid arthritis, unspecified 05/07/2015 Yes J44.9 Chronic obstructive pulmonary disease, unspecified 05/07/2015 Yes Inactive Problems Resolved Problems Electronic Signature(s) Signed: 07/22/2015 10:58:24 AM By: Sharon Mt Signed: 08/20/2015 4:47:02 PM By: Linton Ham MD Previous Signature: 07/09/2015 4:36:57 PM Version By: Linton Ham MD Previous Signature: 07/09/2015 5:10:35 PM Version By: Christin Fudge MD, FACS Entered By: Sharon Mt on 07/22/2015 10:58:24 [name] ([id number]) -------------------------------------------------------------------------------- Progress Note Details Patient Name: [name] Date of Service: 07/09/2015 12:45  PM Medical Record Patient Account Number: [id number] Number: Treating RN: Ahmed Prima 1940/08/05 (74 y.o. Other Clinician: Date of Birth/Sex: Female) Treating Danny Zimny Primary Care Physician/Extender: Claudette Laws Physician: Referring Physician: Melina Modena in Treatment: 9 Subjective Chief Complaint Information obtained from Patient Left calf ulceration. History of Present Illness (HPI) Pleasant 75 year old with history of rheumatoid arthritis, smoking, and COPD. No history of diabetes or PAD. Left ABI 1.2. 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 Pathology showed positive margins. Underwent wide reexcision on 02/03/2015. Pathology showed no residual carcinoma. The wound was closed primarily but dehisced. She developed localized edema and was treated with Unna boots without significant improvement. Completed a course of doxycycline. Plastic surgery consultation requested for consideration of wide reexcision and primary closure. Performing dressing changes with Promogran. Using a Tubigrip for edema control. Cannot apply compression stockings secondary to weakness in her hands. She returns to clinic for follow-up and is without new complaints. She denies any significant pain. No fever or chills. No significant drainage. 07/09/15;  this is a lady who came to [place] for wound on the left anterior leg that was initially and excision site of squamous oh carcinoma. She underwent a wide excision in August. A lot of this is already healed she is been left with a very tiny open area but perhaps with 0.3 mm of depth she does not have any arterial issues no edema Objective Constitutional Vitals Time Taken: 12:52 PM, Height: 64 in, Weight: 88 lbs, BMI: 15.1, Temperature: 98.1 F, Pulse: 89 bpm, Respiratory Rate: 18 breaths/min, Blood Pressure: 118/80 mmHg. Integumentary (Hair, Skin) JANACE, GRUTTADAURIA. ([id number]) Wound #2  status is Open. Original cause of wound was Surgical Injury. The wound is located on the Left,Anterior Lower Leg. The wound measures 0.5cm length x 0.3cm width x 0.2cm depth; 0.118cm^2 area and 0.024cm^3 volume. The wound is limited to skin breakdown. There is no tunneling or undermining noted. There is a large amount of serous drainage noted. The wound margin is flat and intact. There is large (67-100%) red granulation within the wound bed. There is a small (1-33%) amount of necrotic tissue within the wound bed including Adherent Slough. The periwound skin appearance exhibited: Moist. The periwound skin appearance did not exhibit: Callus, Crepitus, Excoriation, Fluctuance, Friable, Induration, Localized Edema, Rash, Scarring, Dry/Scaly, Maceration, Atrophie Blanche, Cyanosis, Ecchymosis, Hemosiderin Staining, Mottled, Pallor, Rubor, Erythema. Periwound temperature was noted as No Abnormality. The periwound has tenderness on palpation. Assessment Active Problems ICD-10 L97.222 - Non-pressure chronic ulcer of left calf with fat layer exposed T81.31XS - Disruption of external operation (surgical) wound, not elsewhere classified, sequela M06.9 - Rheumatoid arthritis, unspecified J44.9 - Chronic obstructive pulmonary disease, unspecified Procedures Wound #2 Wound #2 is an Open Surgical Wound located on the Left,Anterior Lower Leg . There was an Open Wound debridement with total area of 0.15 sq cm performed by Christin Fudge, MD. with the following instrument(s): Blade to remove Viable and Non-Viable tissue/material including Exudate, Fibrin/Slough, and Subcutaneous after achieving pain control using Other (lidocaine 4% cream). A time out was conducted prior to the start of the procedure. There was no bleeding. The procedure was tolerated well with a pain level of 0 throughout and a pain level of 0 following the procedure. Post Debridement Measurements: 0.5cm length x 0.3cm width x 0.3cm depth;  0.035cm^3 volume. Post procedure Diagnosis Wound #2: Same as Pre-Procedure Plan CAMEKA, DEADRICK ([id number]) #1 continue the Iodosorb and foam based dressings. These can be changed every day. #2 follow-up in 2 weeks Electronic Signature(s) Signed: 08/20/2015 4:47:02 PM By: Linton Ham MD Previous Signature: 07/09/2015 4:36:57 PM Version By: Linton Ham MD Entered By: Linton Ham on 07/22/2015 11:00:23 [name] ([id number]) -------------------------------------------------------------------------------- SuperBill Details Patient Name: [name] Date of Service: 07/09/2015 Medical Record Patient Account Number: [id number] Number: Treating RN: Ahmed Prima [birth year] (74 y.o. Other Clinician: Date of Birth/Sex: Female) Treating Arnice Vanepps Primary Care Physician/Extender: Claudette Laws Physician: Suella Grove in Treatment: 9 Referring Physician: Emily Filbert Diagnosis Coding ICD-10 Codes Code Description [phone] Non-pressure chronic ulcer of left calf with fat layer exposed T81.31XS Disruption of external operation (surgical) wound, not elsewhere classified, sequela M06.9 Rheumatoid arthritis, unspecified J44.9 Chronic obstructive pulmonary disease, unspecified Facility Procedures CPT4 Code: [id number] Description: [id number] - DEBRIDE WOUND 1ST 20 SQ CM OR < ICD-10 Description Diagnosis L97.222 Non-pressure chronic ulcer of left calf with fat la Modifier: yer exposed Quantity: 1 Physician Procedures CPT4 Code: [id number] Description: 97597 - WC PHYS DEBR WO ANESTH 20 SQ CM ICD-10 Description Diagnosis L97.222  Non-pressure chronic ulcer of left calf with fat la Modifier: yer exposed Quantity: 1 Electronic Signature(s) Signed: 07/09/2015 4:36:57 PM By: Linton Ham MD Entered By: Linton Ham on 07/09/2015 13:26:18
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
## 2015-07-22 ENCOUNTER — Encounter: Payer: Medicare Other | Admitting: Internal Medicine

## 2015-07-22 DIAGNOSIS — L97222 Non-pressure chronic ulcer of left calf with fat layer exposed: Secondary | ICD-10-CM | POA: Diagnosis not present

## 2015-07-24 NOTE — Progress Notes (Signed)
[name], [name] ([id number]) Visit Report for 07/22/2015 Chief Complaint Document Details Patient Name: TAKARA, HELFERT Date of Service: 07/22/2015 9:45 AM Medical Record Patient Account Number: [id number] Number: Treating RN: Montey Hora 1941-05-26 (74 y.o. Other Clinician: Date of Birth/Sex: Female) Treating [name] Primary Care Physician/Extender: Claudette Laws Physician: Referring Physician: Melina Modena in Treatment: 10 Information Obtained from: Patient Chief Complaint Left calf ulceration. Electronic Signature(s) Signed: 07/23/2015 5:04:49 PM By: Linton Ham MD Entered By: Linton Ham on 07/22/2015 10:40:53 [name] ([id number]) -------------------------------------------------------------------------------- HPI Details Patient Name: [name] Date of Service: 07/22/2015 9:45 AM Medical Record Patient Account Number: [id number] Number: Treating RN: Montey Hora Sep 30, 1940 (74 y.o. Other Clinician: Date of Birth/Sex: Female) Treating [name] Primary Care Physician/Extender: Claudette Laws Physician: Referring Physician: Melina Modena in Treatment: 10 History of Present Illness HPI Description: Pleasant 75 year old with history of rheumatoid arthritis, smoking, and COPD. No history of diabetes or PAD. Left ABI 1.2. 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 Pathology showed positive margins. Underwent wide reexcision on 02/03/2015. Pathology showed no residual carcinoma. The wound was closed primarily but dehisced. She developed localized edema and was treated with Unna boots without significant improvement. Completed a course of doxycycline. Plastic surgery consultation requested for consideration of wide reexcision and primary closure. Performing dressing changes with Promogran. Using a Tubigrip for edema control. Cannot apply compression  stockings secondary to weakness in her hands. She returns to clinic for follow-up and is without new complaints. She denies any significant pain. No fever or chills. No significant drainage. 07/09/15; this is a lady who came to [place] for wound on the left anterior leg that was initially and excision site of squamous oh carcinoma. She underwent a wide excision in August. A lot of this is already healed she is been left with a very tiny open area but perhaps with 0.3 cm of depth she does not have any arterial issues no edema 07/22/15; patient returns today. She has been using Iodosorb for what was a small area but with perhaps 0.3 cm of depth. The original cause was a surgical excision of a squamous cell carcinoma Electronic Signature(s) Signed: 07/23/2015 5:04:49 PM By: Linton Ham MD Entered By: Linton Ham on 07/22/2015 10:41:46 [name] ([id number]) -------------------------------------------------------------------------------- Physical Exam Details Patient Name: [name] Date of Service: 07/22/2015 9:45 AM Medical Record Patient Account Number: [id number] Number: Treating RN: Montey Hora Dec 06, 1940 (74 y.o. Other Clinician: Date of Birth/Sex: Female) Treating [name] Primary Care Physician/Extender: Claudette Laws Physician: Referring Physician: Melina Modena in Treatment: 10 Cardiovascular Pedal pulses palpable and strong bilaterally.. Extremities are free of varicosities, clubbing or edema. Peripheral pulses strong and equal. Capillary refill < 3 seconds.. Notes Wound exam; the tiny open area on her left anterior leg as totally closed over. She has some surrounding discoloration which I think is a reminiscent of her initial skin surgery. I told her I didn't think this was a huge problem. She does not have edema although she complains about recent onset of edema in her feet I see no reason for compression hose at this  point Electronic Signature(s) Signed: 07/23/2015 5:04:49 PM By: Linton Ham MD Entered By: Linton Ham on 07/22/2015 10:42:52 [name] ([id number]) -------------------------------------------------------------------------------- Physician Orders Details Patient Name: [name] Date of Service: 07/22/2015 9:45 AM Medical Record Patient Account Number: [id number] Number: Treating RN: Montey Hora 08/03/1940 (74  y.o. Other Clinician: Date of Birth/Sex: Female) Treating [name] Primary Care Physician/Extender: Claudette Laws Physician: Referring Physician: Melina Modena in Treatment: 10 Verbal / Phone Orders: Yes Clinician: Montey Hora Read Back and Verified: Yes Diagnosis Coding Discharge From [facility] Services o Discharge from [place] Signature(s) Signed: 07/22/2015 4:27:35 PM By: Montey Hora Signed: 07/23/2015 5:04:49 PM By: Linton Ham MD Entered By: Montey Hora on 07/22/2015 10:25:30 [name] ([id number]) -------------------------------------------------------------------------------- Problem List Details Patient Name: [name] Date of Service: 07/22/2015 9:45 AM Medical Record Patient Account Number: [id number] Number: Treating RN: Montey Hora 02-Apr-1941 (74 y.o. 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 T81.31XS Disruption of external operation (surgical) wound, not 05/07/2015 Yes elsewhere classified, sequela M06.9 Rheumatoid arthritis, unspecified 05/07/2015 Yes J44.9 Chronic obstructive pulmonary disease, unspecified 05/07/2015 Yes Inactive  Problems Resolved Problems Electronic Signature(s) Signed: 07/23/2015 5:04:49 PM By: Linton Ham MD Entered By: Linton Ham on 07/22/2015 10:40:42 [name] ([id number]) -------------------------------------------------------------------------------- Progress Note Details Patient Name: [name] Date of Service: 07/22/2015 9:45 AM Medical Record Patient Account Number: [id number] Number: Treating RN: Montey Hora 1941-01-15 (74 y.o. Other Clinician: Date of Birth/Sex: Female) Treating [name] Primary Care Physician/Extender: Claudette Laws Physician: Referring Physician: Melina Modena in Treatment: 10 Subjective Chief Complaint Information obtained from Patient Left calf ulceration. History of Present Illness (HPI) Pleasant 75 year old with history of rheumatoid arthritis, smoking, and COPD. No history of diabetes or PAD. Left ABI 1.2. 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 Pathology showed positive margins. Underwent wide reexcision on 02/03/2015. Pathology showed no residual carcinoma. The wound was closed primarily but dehisced. She developed localized edema and was treated with Unna boots without significant improvement. Completed a course of doxycycline. Plastic surgery consultation requested for consideration of wide reexcision and primary closure. Performing dressing changes with Promogran. Using a Tubigrip for edema control. Cannot apply compression stockings secondary to weakness in her hands. She returns to clinic for follow-up and is without new complaints. She denies any significant pain. No fever or chills. No significant drainage. 07/09/15; this is a lady who came to [place] for wound on the left anterior leg that was initially and excision site of squamous oh carcinoma. She underwent a wide excision in August. A lot of this is already healed she is been left with a very tiny  open area but perhaps with 0.3 cm of depth she does not have any arterial issues no edema 07/22/15; patient returns today. She has been using Iodosorb for what was a small area but with perhaps 0.3 cm of depth. The original cause was a surgical excision of a squamous cell carcinoma Objective Constitutional Vitals Time Taken: 10:08 AM, Height: 64 in, Weight: 88 lbs, BMI: 15.1, Temperature: 98.3 F, Pulse: 84 bpm, Respiratory Rate: 18 breaths/min, Blood Pressure: 121/71 mmHg. [name], [name] ([id number]) Cardiovascular Pedal pulses palpable and strong bilaterally.. Extremities are free of varicosities, clubbing or edema. Peripheral pulses strong and equal. Capillary refill < 3 seconds.. General Notes: Wound exam; the tiny open area on her left anterior leg as totally closed over. She has some surrounding discoloration which I think is a reminiscent of her initial skin surgery. I told her I  didn't think this was a huge problem. She does not have edema although she complains about recent onset of edema in her feet I see no reason for compression hose at this point Integumentary (Hair, Skin) Wound #2 status is Healed - Epithelialized. Original cause of wound was Surgical Injury. The wound is located on the Left,Anterior Lower Leg. The wound measures 0cm length x 0cm width x 0cm depth; 0cm^2 area and 0cm^3 volume. Assessment Active Problems ICD-10 L97.222 - Non-pressure chronic ulcer of left calf with fat layer exposed T81.31XS - Disruption of external operation (surgical) wound, not elsewhere classified, sequela M06.9 - Rheumatoid arthritis, unspecified J44.9 - Chronic obstructive pulmonary disease, unspecified Plan Discharge From [facility] Services: Discharge from [place] #1 the patient is actually completely closed over. #2 at this point I can't see any major issues going forward, she should do well [name], [name] ([id number]) Electronic Signature(s) Signed: 07/23/2015 5:04:49  PM By: Linton Ham MD Entered By: Linton Ham on 07/22/2015 10:44:22 [name] ([id number]) -------------------------------------------------------------------------------- SuperBill Details Patient Name: [name] Date of Service: 07/22/2015 Medical Record Patient Account Number: [id number] Number: Treating RN: Montey Hora 1940/11/29 (74 y.o. Other Clinician: Date of Birth/Sex: Female) Treating Roselyne Stalnaker Primary Care Physician/Extender: Claudette Laws Physician: Suella Grove in Treatment: 10 Referring Physician: Emily Filbert Diagnosis Coding ICD-10 Codes Code Description [phone] Non-pressure chronic ulcer of left calf with fat layer exposed T81.31XS Disruption of external operation (surgical) wound, not elsewhere classified, sequela M06.9 Rheumatoid arthritis, unspecified J44.9 Chronic obstructive pulmonary disease, unspecified Facility Procedures CPT4 Code: [id number] Description: [phone] - WOUND CARE VISIT-LEV 2 EST PT Modifier: Quantity: 1 Physician Procedures CPT4 Code: [id number] Description: [id number] - WC PHYS LEVEL 2 - EST PT ICD-10 Description Diagnosis L97.222 Non-pressure chronic ulcer of left calf with fat Modifier: layer exposed Quantity: 1 Electronic Signature(s) Signed: 07/23/2015 5:04:49 PM By: Linton Ham MD Entered By: Linton Ham on 07/22/2015 10:44:46

## 2015-07-24 NOTE — Progress Notes (Signed)
[name], [name] ([id number]) Visit Report for 07/22/2015 Arrival Information Details Patient Name: [name], [name] Date of Service: 07/22/2015 9:45 AM Medical Record Patient Account Number: [id number] Number: Treating RN: Montey Hora 1940-11-28 (74 y.o. Other Clinician: Date of Birth/Sex: Female) Treating ROBSON, MICHAEL Primary Care Physician/Extender: Claudette Laws Physician: Referring Physician: Melina Modena in Treatment: 10 Visit Information History Since Last Visit Added or deleted any medications: No Patient Arrived: Ambulatory Any new allergies or adverse reactions: No Arrival Time: 10:03 Had a fall or experienced change in No Accompanied By: self activities of daily living that may affect Transfer Assistance: None risk of falls: Patient Identification Verified: Yes Signs or symptoms of abuse/neglect since last No Secondary Verification Process Yes visito Completed: Hospitalized since last visit: No Patient Requires Transmission-Based No Pain Present Now: No Precautions: Patient Has Alerts: No Electronic Signature(s) Signed: 07/22/2015 4:27:35 PM By: Montey Hora Entered By: Montey Hora on 07/22/2015 10:06:33 [name] ([id number]) -------------------------------------------------------------------------------- Clinic Level of Care Assessment Details Patient Name: [name] Date of Service: 07/22/2015 9:45 AM Medical Record Patient Account Number: [id number] Number: Treating RN: Montey Hora [birth year] (74 y.o. Other Clinician: Date of Birth/Sex: Female) Treating ROBSON, MICHAEL Primary Care Physician/Extender: Claudette Laws Physician: Referring Physician: Melina Modena in Treatment: 10 Clinic Level of Care Assessment Items TOOL 4 Quantity Score []  - Use when only an EandM is performed on FOLLOW-UP visit 0 ASSESSMENTS - Nursing Assessment / Reassessment X - Reassessment of Co-morbidities  (includes updates in patient status) 1 10 X - Reassessment of Adherence to Treatment Plan 1 5 ASSESSMENTS - Wound and Skin Assessment / Reassessment X - Simple Wound Assessment / Reassessment - one wound 1 5 []  - Complex Wound Assessment / Reassessment - multiple wounds 0 []  - Dermatologic / Skin Assessment (not related to wound area) 0 ASSESSMENTS - Focused Assessment []  - Circumferential Edema Measurements - multi extremities 0 []  - Nutritional Assessment / Counseling / Intervention 0 X - Lower Extremity Assessment (monofilament, tuning fork, pulses) 1 5 []  - Peripheral Arterial Disease Assessment (using hand held doppler) 0 ASSESSMENTS - Ostomy and/or Continence Assessment and Care []  - Incontinence Assessment and Management 0 []  - Ostomy Care Assessment and Management (repouching, etc.) 0 PROCESS - Coordination of Care X - Simple Patient / Family Education for ongoing care 1 15 []  - Complex (extensive) Patient / Family Education for ongoing care 0 []  - Staff obtains Consents, Records, Test Results / Process Orders 0 [name], [name] ([id number]) []  - Staff telephones HHA, Nursing Homes / Clarify orders / etc 0 []  - Routine Transfer to another Facility (non-emergent condition) 0 []  - Routine Hospital Admission (non-emergent condition) 0 []  - New Admissions / Biomedical engineer / Ordering NPWT, Apligraf, etc. 0 []  - Emergency Hospital Admission (emergent condition) 0 X - Simple Discharge Coordination 1 10 []  - Complex (extensive) Discharge Coordination 0 PROCESS - Special Needs []  - Pediatric / Minor Patient Management 0 []  - Isolation Patient Management 0 []  - Hearing / Language / Visual special needs 0 []  - Assessment of Community assistance (transportation, D/C planning, etc.) 0 []  - Additional assistance / Altered mentation 0 []  - Support Surface(s) Assessment (bed, cushion, seat, etc.) 0 INTERVENTIONS - Wound Cleansing / Measurement X - Simple Wound Cleansing - one  wound 1 5 []  - Complex Wound Cleansing - multiple wounds 0 X - Wound Imaging (photographs - any number of wounds) 1 5 []  - Wound Tracing (instead of photographs)  0 X - Simple Wound Measurement - one wound 1 5 []  - Complex Wound Measurement - multiple wounds 0 INTERVENTIONS - Wound Dressings []  - Small Wound Dressing one or multiple wounds 0 []  - Medium Wound Dressing one or multiple wounds 0 []  - Large Wound Dressing one or multiple wounds 0 []  - Application of Medications - topical 0 []  - Application of Medications - injection 0 [name], [name] ([id number]) INTERVENTIONS - Miscellaneous []  - External ear exam 0 []  - Specimen Collection (cultures, biopsies, blood, body fluids, etc.) 0 []  - Specimen(s) / Culture(s) sent or taken to Lab for analysis 0 []  - Patient Transfer (multiple staff / Harrel Lemon Lift / Similar devices) 0 []  - Simple Staple / Suture removal (25 or less) 0 []  - Complex Staple / Suture removal (26 or more) 0 []  - Hypo / Hyperglycemic Management (close monitor of Blood Glucose) 0 []  - Ankle / Brachial Index (ABI) - do not check if billed separately 0 X - Vital Signs 1 5 Has the patient been seen at the hospital within the last three years: Yes Total Score: 70 Level Of Care: New/Established - Level 2 Electronic Signature(s) Signed: 07/22/2015 4:27:35 PM By: Montey Hora Entered By: Montey Hora on 07/22/2015 10:25:54 [name] ([id number]) -------------------------------------------------------------------------------- Encounter Discharge Information Details Patient Name: [name] Date of Service: 07/22/2015 9:45 AM Medical Record Patient Account Number: [id number] Number: Treating RN: Montey Hora 1940-10-31 (74 y.o. Other Clinician: Date of Birth/Sex: Female) Treating ROBSON, MICHAEL Primary Care Physician/Extender: Claudette Laws Physician: Referring Physician: Melina Modena in Treatment: 10 Encounter Discharge  Information Items Discharge Pain Level: 0 Discharge Condition: Stable Ambulatory Status: Ambulatory Discharge Destination: Home Transportation: Private Auto Accompanied By: self Schedule Follow-up Appointment: No Medication Reconciliation completed and provided to Patient/Care No [name]: Provided on Clinical Summary of Care: 07/22/2015 Form Type Recipient Paper Patient [place] Electronic Signature(s) Signed: 07/22/2015 10:31:59 AM By: Ruthine Dose Entered By: Ruthine Dose on 07/22/2015 10:31:58 [name] ([id number]) -------------------------------------------------------------------------------- Lower Extremity Assessment Details Patient Name: [name] Date of Service: 07/22/2015 9:45 AM Medical Record Patient Account Number: [id number] Number: Treating RN: Montey Hora 06/23/1940 (74 y.o. Other Clinician: Date of Birth/Sex: Female) Treating ROBSON, MICHAEL Primary Care Physician/Extender: Claudette Laws Physician: Referring Physician: Melina Modena in Treatment: 10 Edema Assessment Assessed: [Left: No] [Right: No] Edema: [Left: Ye] [Right: s] Vascular Assessment Pulses: Posterior Tibial Dorsalis Pedis Palpable: [Left:Yes] Extremity colors, hair growth, and conditions: Extremity Color: [Left:Normal] Hair Growth on Extremity: [Left:Yes] Temperature of Extremity: [Left:Warm] Capillary Refill: [Left:< 3 seconds] Electronic Signature(s) Signed: 07/22/2015 4:27:35 PM By: Montey Hora Entered By: Montey Hora on 07/22/2015 10:13:34 [name] ([id number]) -------------------------------------------------------------------------------- Multi-Disciplinary Care Plan Details Patient Name: [name] Date of Service: 07/22/2015 9:45 AM Medical Record Patient Account Number: [id number] Number: Treating RN: Montey Hora [birth year] (74 y.o. Other Clinician: Date of Birth/Sex: Female) Treating ROBSON,  MICHAEL Primary Care Physician/Extender: Claudette Laws Physician: Referring Physician: Melina Modena in Treatment: 10 Active Inactive Electronic Signature(s) Signed: 07/22/2015 4:27:35 PM By: Montey Hora Entered By: Montey Hora on 07/22/2015 10:25:16 [name] ([id number]) -------------------------------------------------------------------------------- Patient/Caregiver Education Details Patient Name: [name] Date of Service: 07/22/2015 9:45 AM Medical Record Patient Account Number: [id number] Number: Treating RN: Montey Hora 04-01-1941 (74 y.o. Other Clinician: Date of Birth/Gender: Female) Treating ROBSON, MICHAEL Primary Care Physician/Extender: Claudette Laws Physician: Suella Grove in Treatment: 10 Referring Physician: Emily Filbert Education Assessment Education Provided To:  Patient Education Topics Provided Basic Hygiene: Handouts: Other: skin care of newly healed ulcer site Methods: Demonstration, Explain/Verbal Responses: State content correctly Electronic Signature(s) Signed: 07/22/2015 4:27:35 PM By: Montey Hora Entered By: Montey Hora on 07/22/2015 10:26:31 [name] ([id number]) -------------------------------------------------------------------------------- Wound Assessment Details Patient Name: [name] Date of Service: 07/22/2015 9:45 AM Medical Record Patient Account Number: [id number] Number: Treating RN: Montey Hora [birth year] (74 y.o. Other Clinician: Date of Birth/Sex: Female) Treating ROBSON, MICHAEL Primary Care Physician/Extender: Claudette Laws Physician: Referring Physician: Melina Modena in Treatment: 10 Wound Status Wound Number: 2 Primary Etiology: Open Surgical Wound Wound Location: Left, Anterior Lower Leg Wound Status: Healed - Epithelialized Wounding Event: Surgical Injury Date Acquired: 04/06/2015 Weeks Of Treatment: 10 Clustered Wound:  No Photos Photo Uploaded By: Montey Hora on 07/22/2015 12:57:53 Wound Measurements Length: (cm) 0 % Reducti Width: (cm) 0 % Reducti Depth: (cm) 0 Area: (cm) 0 Volume: (cm) 0 on in Area: 100% on in Volume: 100% Wound Description Full Thickness Without Exposed Classification: Support Structures Periwound Skin Texture Texture Color No Abnormalities Noted: No No Abnormalities Noted: No Moisture No Abnormalities Noted: No AMIAH, GUASTELLA ([id number]) Electronic Signature(s) Signed: 07/22/2015 4:27:35 PM By: Montey Hora Entered By: Montey Hora on 07/22/2015 10:24:57 [name] ([id number]) -------------------------------------------------------------------------------- [place] Details Patient Name: [name] Date of Service: 07/22/2015 9:45 AM Medical Record Patient Account Number: [id number] Number: Treating RN: Montey Hora January 23, 1941 (74 y.o. Other Clinician: Date of Birth/Sex: Female) Treating ROBSON, MICHAEL Primary Care Physician/Extender: Claudette Laws Physician: Referring Physician: Melina Modena in Treatment: 10 Vital Signs Time Taken: 10:08 Temperature (F): 98.3 Height (in): 64 Pulse (bpm): 84 Weight (lbs): 88 Respiratory Rate (breaths/min): 18 Body Mass Index (BMI): 15.1 Blood Pressure (mmHg): 121/71 Reference Range: 80 - 120 mg / dl Electronic Signature(s) Signed: 07/22/2015 4:27:35 PM By: Montey Hora Entered By: Montey Hora on 07/22/2015 10:08:43

## 2015-07-29 ENCOUNTER — Emergency Department
Admission: EM | Admit: 2015-07-29 | Discharge: 2015-07-29 | Disposition: A | Discharge status: Home / Self Care | Payer: Medicare Other | Attending: Emergency Medicine | Admitting: Emergency Medicine

## 2015-07-29 ENCOUNTER — Emergency Department: Payer: Medicare Other

## 2015-07-29 ENCOUNTER — Encounter: Payer: Self-pay | Admitting: Emergency Medicine

## 2015-07-29 DIAGNOSIS — Z87891 Personal history of nicotine dependence: Secondary | ICD-10-CM | POA: Diagnosis not present

## 2015-07-29 DIAGNOSIS — R1084 Generalized abdominal pain: Secondary | ICD-10-CM

## 2015-07-29 DIAGNOSIS — K5901 Slow transit constipation: Secondary | ICD-10-CM

## 2015-07-29 DIAGNOSIS — K5900 Constipation, unspecified: Secondary | ICD-10-CM | POA: Diagnosis present

## 2015-07-29 DIAGNOSIS — Z79899 Other long term (current) drug therapy: Secondary | ICD-10-CM | POA: Insufficient documentation

## 2015-07-29 DIAGNOSIS — F419 Anxiety disorder, unspecified: Secondary | ICD-10-CM | POA: Diagnosis not present

## 2015-07-29 LAB — COMPREHENSIVE METABOLIC PANEL
ALT: 35 U/L (ref 14–54)
ANION GAP: 7 (ref 5–15)
AST: 40 U/L (ref 15–41)
Albumin: 4 g/dL (ref 3.5–5.0)
Alkaline Phosphatase: 66 U/L (ref 38–126)
BUN: 8 mg/dL (ref 6–20)
CHLORIDE: 89 mmol/L — AB (ref 101–111)
CO2: 30 mmol/L (ref 22–32)
Calcium: 8.8 mg/dL — ABNORMAL LOW (ref 8.9–10.3)
Creatinine, Ser: 0.73 mg/dL (ref 0.44–1.00)
GFR calc non Af Amer: >60 mL/min (ref >60)
Glucose, Bld: 87 mg/dL (ref 65–99)
POTASSIUM: 3 mmol/L — AB (ref 3.5–5.1)
SODIUM: 126 mmol/L — AB (ref 135–145)
Total Bilirubin: 1.3 mg/dL — ABNORMAL HIGH (ref 0.3–1.2)
Total Protein: 6.3 g/dL — ABNORMAL LOW (ref 6.5–8.1)

## 2015-07-29 LAB — URINALYSIS COMPLETE WITH MICROSCOPIC (ARMC ONLY)
Bacteria, UA: NONE SEEN
Bilirubin Urine: NEGATIVE
GLUCOSE, UA: NEGATIVE mg/dL
HGB URINE DIPSTICK: NEGATIVE
Leukocytes, UA: NEGATIVE
Nitrite: NEGATIVE
Protein, ur: NEGATIVE mg/dL
RBC / HPF: NONE SEEN RBC/hpf (ref 0–5)
SPECIFIC GRAVITY, URINE: 1.006 (ref 1.005–1.030)
Squamous Epithelial / LPF: NONE SEEN
pH: 7 (ref 5.0–8.0)

## 2015-07-29 LAB — CBC
HCT: 38.4 % (ref 35.0–47.0)
HEMOGLOBIN: 13.7 g/dL (ref 12.0–16.0)
MCH: 32.8 pg (ref 26.0–34.0)
MCHC: 35.6 g/dL (ref 32.0–36.0)
MCV: 92 fL (ref 80.0–100.0)
Platelets: 312 10*3/uL (ref 150–440)
RBC: 4.17 MIL/uL (ref 3.80–5.20)
RDW: 14.9 % — ABNORMAL HIGH (ref 11.5–14.5)
WBC: 6.6 10*3/uL (ref 3.6–11.0)

## 2015-07-29 LAB — LIPASE, BLOOD: LIPASE: 23 U/L (ref 11–51)

## 2015-07-29 MED ORDER — MINERAL OIL RE ENEM
1.0000 | ENEMA | Freq: Once | RECTAL | Status: AC
  Administered 2015-07-29: 1 via RECTAL

## 2015-07-29 MED ORDER — MAGNESIUM CITRATE PO SOLN
1.0000 | Freq: Once | ORAL | Status: DC
Start: 2015-07-29 — End: 2016-09-27

## 2015-07-29 NOTE — ED Provider Notes (Signed)
[facility] Emergency Department Provider Note  ____________________________________________    I have reviewed the triage vital signs and the nursing notes.   HISTORY  Chief Complaint Abdominal Pain and Constipation    HPI [name] is a 75 y.o. female who presents with complaints of constipation and abdominal discomfort. She reports she hasn't had a bowel movement in almost a week and complains of a sensation of bloating. She does have a history of IBS and does report frequent abdominal pain. She denies fevers or chills. She denies dysuria. She reports that when she tries to bowel movement she is unable to pass any stool although she is passing gas. She reports she had a similar discomfort several weeks ago and came to the emergency department and felt much better after an enema     Past Medical History  Diagnosis Date  . Arthritis, rheumatoid ([place])   . Thyroid binding globulin deficiency   . Breast pain   . GERD (gastroesophageal reflux disease)   . Chest pain   . Tobacco abuse   . Chest discomfort   . Abdominal pain   . Hypothyroidism   . Renal artery stenosis ([place])   . COPD (chronic obstructive pulmonary disease) ([place])   . Hx of colonic polyps   . Anxiety   . Chronic pain syndrome   . Hemorrhoids   . Gastritis   . Diverticulosis   . Fibrocystic breast disease   . Osteopenia   . Constipation   . Right ankle pain   . Collagen vascular disease [facility]: [facility] Rhonda W)     Patient Active Problem List   Diagnosis Date Noted  . RA (rheumatoid arthritis) ([place]) 02/18/2015  . Arthritis, rheumatoid ([place])   . Thyroid binding globulin deficiency   . Breast pain   . GERD (gastroesophageal reflux disease)   . Tobacco abuse   . Chest discomfort   . Abdominal pain   . Hypothyroidism   . Renal artery stenosis ([place])   . COPD (chronic obstructive pulmonary disease) ([place])   . Hx of colonic polyps   . Anxiety   . Hemorrhoids   . Gastritis   .  Diverticulosis   . Fibrocystic breast disease   . Osteopenia   . Constipation   . CHRONIC PAIN SYNDROME 08/03/2007    Past Surgical History  Procedure Laterality Date  . Cholecystectomy    . Tonsillectomy    . Leg surgery      Current Outpatient Rx  Name  Route  Sig  Dispense  Refill  . acidophilus (RISAQUAD) CAPS capsule   Oral   Take 1 capsule by mouth daily.         Marland Kitchen ALPRAZolam (XANAX) 0.5 MG tablet   Oral   Take 0.5 mg by mouth 2 (two) times daily as needed.          . Calcium Carbonate-Vit D-Min (CALCIUM 1200 PO)   Oral   Take 2 tablets by mouth daily.         . cefUROXime (CEFTIN) 250 MG tablet   Oral   Take 1 tablet by mouth 2 (two) times daily.         . cyanocobalamin (,VITAMIN B-12,) 1000 MCG/ML injection   Intramuscular   Inject 1,000 mcg into the muscle every 14 (fourteen) days. Pt gets every 6 weeks         . cyclobenzaprine (FLEXERIL) 10 MG tablet   Oral   Take 1 tablet by mouth 3 (three) times daily  as needed.         . fluticasone (FLONASE) 50 MCG/ACT nasal spray   Nasal   Place 2 sprays into the nose daily.         . folic acid (FOLVITE) 1 MG tablet   Oral   Take 1 tablet by mouth daily.         . hydroxychloroquine (PLAQUENIL) 200 MG tablet      TAKE ONE TABLET TWICE DAILY         . levothyroxine (SYNTHROID, LEVOTHROID) 88 MCG tablet   Oral   Take 88 mcg by mouth daily.         . Magnesium 250 MG TABS   Oral   Take 1 tablet by mouth daily.         . Methotrexate Sodium (METHOTREXATE, PF,) 200 MG/8ML injection      ADMINISTER 0.8MLS ONCE A WEEK         . Multiple Vitamins-Minerals (MULTIVITAMIN WITH MINERALS) tablet   Oral   Take 1 tablet by mouth daily.         Marland Kitchen omeprazole (PRILOSEC) 20 MG capsule   Oral   Take 20 mg by mouth.         Marland Kitchen omeprazole (PRILOSEC) 40 MG capsule   Oral   Take 1 capsule by mouth daily.         . polyethylene glycol powder (GLYCOLAX/MIRALAX) powder      Dissolved 1  tablespoon in 4-8 ounces of water or juice up and drink up to 3 times daily as needed for constipation.   255 g   0   . senna (SENOKOT) 8.6 MG tablet   Oral   Take 16 mg by mouth.         . vitamin C (ASCORBIC ACID) 500 MG tablet   Oral   Take 500 mg by mouth daily.         Marland Kitchen VITAMIN D, CHOLECALCIFEROL, PO   Oral   Take 1 tablet by mouth daily.           Allergies Etrafon; Hydrocodone; Mirtazapine; Doxycycline; and Percocet  Family History  Problem Relation Age of Onset  . Heart disease Sister   . Hypertension Brother   . Heart attack Brother   . Hyperlipidemia Brother     Social History Social History  Substance Use Topics  . Smoking status: Former Smoker -- 1.00 packs/day    Types: Cigarettes  . Smokeless tobacco: Current User  . Alcohol Use: No    Review of Systems  Constitutional: Negative for fever. Eyes: Negative for visual changes. ENT: Negative for neck pain Cardiovascular: Negative for chest pain. Respiratory: Negative for shortness of breath. Gastrointestinal: As above Genitourinary: Negative for dysuria. Musculoskeletal: Negative for back pain. Skin: Negative for rash. Neurological: Negative for headaches  Psychiatric: Mild anxiety    ____________________________________________   PHYSICAL EXAM:  VITAL SIGNS: ED Triage Vitals  Enc Vitals Group     BP 07/29/15 0925 105/75 mmHg     Pulse Rate 07/29/15 0925 91     Resp 07/29/15 0925 20     Temp 07/29/15 0925 98.5 F (36.9 C)     Temp Source 07/29/15 0925 Oral     SpO2 07/29/15 0925 100 %     Weight 07/29/15 0925 84 lb (38.102 kg)     Height 07/29/15 0925 5\' 4"  (1.626 m)     Head Cir --      Peak Flow --  Pain Score 07/29/15 0926 9     Pain Loc --      Pain Edu? --      Excl. in [place]? --      Constitutional: Alert and oriented. No acute distress, pleasant and interactive Eyes: Conjunctivae are normal.  ENT   Head: Normocephalic and atraumatic.   Mouth/Throat:  Mucous membranes are moist. Cardiovascular: Normal rate, regular rhythm. Normal and symmetric distal pulses are present in all extremities.  Respiratory: Normal respiratory effort without tachypnea nor retractions. Breath sounds are clear and equal bilaterally.  Gastrointestinal: Soft and non-tender in all quadrants. No distention. There is no CVA tenderness. No pulsatile mass Genitourinary: deferred Musculoskeletal: Nontender with normal range of motion in all extremities. No lower extremity tenderness nor edema. Neurologic:  Normal speech and language. No gross focal neurologic deficits are appreciated. Skin:  Skin is warm, dry and intact. No rash noted. Psychiatric: Mood and affect are normal. Patient exhibits appropriate insight and judgment.  ____________________________________________    LABS (pertinent positives/negatives)  Labs Reviewed  COMPREHENSIVE METABOLIC PANEL - Abnormal; Notable for the following:    Sodium 126 (*)    Potassium 3.0 (*)    Chloride 89 (*)    Calcium 8.8 (*)    Total Protein 6.3 (*)    Total Bilirubin 1.3 (*)    All other components within normal limits  CBC - Abnormal; Notable for the following:    RDW 14.9 (*)    All other components within normal limits  URINALYSIS COMPLETEWITH MICROSCOPIC ([facility] ONLY) - Abnormal; Notable for the following:    Color, Urine YELLOW (*)    APPearance HAZY (*)    Ketones, ur 1+ (*)    All other components within normal limits  LIPASE, BLOOD    ____________________________________________   EKG  None  ____________________________________________    RADIOLOGY I have personally reviewed any xrays that were ordered on this patient: Abdominal x-ray unremarkable Reviewed CT scan from 1/31  ____________________________________________   PROCEDURES  Procedure(s) performed: none  Critical Care performed: none  ____________________________________________   INITIAL IMPRESSION / ASSESSMENT AND PLAN /  ED COURSE  Pertinent labs & imaging results that were available during my care of the patient were reviewed by me and considered in my medical decision making (see chart for details).  Patient overall well-appearing and in no distress. She complains of constipation and this could very well be the cause of her discomfort. X-ray does not show impaction. We will try mineral oil enema and reevaluate. Overall her lab work is unremarkable, she does have chronic hyponatremia. Her abdominal exam is benign    Patient feeling better. On reexam she continues to not have any tenderness to palpation of abdomen. Given that she is pain-free do not want to perform a CT scan because she just had one. I will discharge her with outpatient follow-up with her PCP. I suspect her abdominal pain is all related to constipation/IBS  ____________________________________________   FINAL CLINICAL IMPRESSION(S) / ED DIAGNOSES  Final diagnoses:  Slow transit constipation  Generalized abdominal pain     Lavonia Drafts, MD 07/29/15 1512

## 2015-07-29 NOTE — ED Notes (Signed)
AAOx3.  Skin warm and dry.  NAD.  D/C home 

## 2015-07-29 NOTE — ED Notes (Signed)
Pt to ed with c/o constipation since last wed.  Pt states she has had decreased intake and abd pain with eating.  Pt reports she attempted using enemas without relief.

## 2015-07-29 NOTE — Discharge Instructions (Signed)
Constipation, Adult Constipation is when a person:  Poops (has a bowel movement) less than 3 times a week.  Has a hard time pooping.  Has poop that is dry, hard, or bigger than normal. HOME CARE   Eat foods with a lot of fiber in them. This includes fruits, vegetables, beans, and whole grains such as brown rice.  Avoid fatty foods and foods with a lot of sugar. This includes french fries, hamburgers, cookies, candy, and soda.  If you are not getting enough fiber from food, take products with added fiber in them (supplements).  Drink enough fluid to keep your pee (urine) clear or pale yellow.  Exercise on a regular basis, or as told by your doctor.  Go to the restroom when you feel like you need to poop. Do not hold it.  Only take medicine as told by your doctor. Do not take medicines that help you poop (laxatives) without talking to your doctor first. GET HELP RIGHT AWAY IF:   You have bright red blood in your poop (stool).  Your constipation lasts more than 4 days or gets worse.  You have belly (abdominal) or butt (rectal) pain.  You have thin poop (as thin as a pencil).  You lose weight, and it cannot be explained. MAKE SURE YOU:   Understand these instructions.  Will watch your condition.  Will get help right away if you are not doing well or get worse.   This information is not intended to replace advice given to you by your health care provider. Make sure you discuss any questions you have with your health care provider.   Document Released: 11/10/2007 Document Revised: 06/14/2014 Document Reviewed: 03/05/2013 Elsevier Interactive Patient Education 2016 Elsevier Inc.  

## 2015-07-29 NOTE — ED Notes (Signed)
Ambulated to BR.  No results from enema.  Patient c/o upper abdominal pain.

## 2015-11-17 DIAGNOSIS — Z85828 Personal history of other malignant neoplasm of skin: Secondary | ICD-10-CM | POA: Insufficient documentation

## 2016-03-15 ENCOUNTER — Encounter
Admission: RE | Admit: 2016-03-15 | Discharge: 2016-03-15 | Disposition: A | Discharge status: Home / Self Care | Payer: Medicare Other | Source: Ambulatory Visit | Attending: Internal Medicine | Admitting: Internal Medicine

## 2016-03-30 DIAGNOSIS — S72141A Displaced intertrochanteric fracture of right femur, initial encounter for closed fracture: Secondary | ICD-10-CM | POA: Insufficient documentation

## 2016-04-05 ENCOUNTER — Non-Acute Institutional Stay (SKILLED_NURSING_FACILITY): Payer: Medicare Other | Admitting: Gerontology

## 2016-04-05 ENCOUNTER — Inpatient Hospital Stay: Admission: RE | Admit: 2016-04-05 | Payer: Self-pay | Source: Ambulatory Visit | Admitting: *Deleted

## 2016-04-05 DIAGNOSIS — K121 Other forms of stomatitis: Secondary | ICD-10-CM | POA: Diagnosis not present

## 2016-04-05 DIAGNOSIS — E871 Hypo-osmolality and hyponatremia: Secondary | ICD-10-CM | POA: Diagnosis not present

## 2016-04-05 DIAGNOSIS — D510 Vitamin B12 deficiency anemia due to intrinsic factor deficiency: Secondary | ICD-10-CM

## 2016-04-05 LAB — COMPREHENSIVE METABOLIC PANEL
ALK PHOS: 108 U/L (ref 38–126)
ALT: 14 U/L (ref 14–54)
AST: 25 U/L (ref 15–41)
Albumin: 3.5 g/dL (ref 3.5–5.0)
Anion gap: 4 — ABNORMAL LOW (ref 5–15)
BUN: 11 mg/dL (ref 6–20)
CALCIUM: 8.6 mg/dL — AB (ref 8.9–10.3)
CO2: 31 mmol/L (ref 22–32)
CREATININE: 0.57 mg/dL (ref 0.44–1.00)
Chloride: 95 mmol/L — ABNORMAL LOW (ref 101–111)
GFR calc non Af Amer: >60 mL/min (ref >60)
GLUCOSE: 81 mg/dL (ref 65–99)
Potassium: 4.1 mmol/L (ref 3.5–5.1)
SODIUM: 130 mmol/L — AB (ref 135–145)
Total Bilirubin: 0.5 mg/dL (ref 0.3–1.2)
Total Protein: 6.3 g/dL — ABNORMAL LOW (ref 6.5–8.1)

## 2016-04-05 LAB — VITAMIN B12: VITAMIN B 12: 716 pg/mL (ref 180–914)

## 2016-04-05 LAB — TSH: TSH: 1.333 u[IU]/mL (ref 0.350–4.500)

## 2016-04-05 LAB — MAGNESIUM: Magnesium: 1.9 mg/dL (ref 1.7–2.4)

## 2016-04-06 LAB — COMPREHENSIVE METABOLIC PANEL
ALT: 11 U/L — AB (ref 14–54)
ANION GAP: 8 (ref 5–15)
AST: 26 U/L (ref 15–41)
Albumin: 3 g/dL — ABNORMAL LOW (ref 3.5–5.0)
Alkaline Phosphatase: 85 U/L (ref 38–126)
BUN: 8 mg/dL (ref 6–20)
CALCIUM: 8.4 mg/dL — AB (ref 8.9–10.3)
CHLORIDE: 96 mmol/L — AB (ref 101–111)
CO2: 27 mmol/L (ref 22–32)
CREATININE: 0.72 mg/dL (ref 0.44–1.00)
Glucose, Bld: 100 mg/dL — ABNORMAL HIGH (ref 65–99)
Potassium: 3.6 mmol/L (ref 3.5–5.1)
SODIUM: 131 mmol/L — AB (ref 135–145)
Total Bilirubin: 0.3 mg/dL (ref 0.3–1.2)
Total Protein: 5.5 g/dL — ABNORMAL LOW (ref 6.5–8.1)

## 2016-04-06 LAB — CBC WITH DIFFERENTIAL/PLATELET
Basophils Absolute: 0 10*3/uL (ref 0–0.1)
Basophils Relative: 1 %
EOS ABS: 0.2 10*3/uL (ref 0–0.7)
EOS PCT: 4 %
HCT: 33.3 % — ABNORMAL LOW (ref 35.0–47.0)
Hemoglobin: 11.3 g/dL — ABNORMAL LOW (ref 12.0–16.0)
LYMPHS ABS: 1.7 10*3/uL (ref 1.0–3.6)
LYMPHS PCT: 35 %
MCH: 31.7 pg (ref 26.0–34.0)
MCHC: 33.9 g/dL (ref 32.0–36.0)
MCV: 93.7 fL (ref 80.0–100.0)
MONO ABS: 0.6 10*3/uL (ref 0.2–0.9)
MONOS PCT: 12 %
Neutro Abs: 2.3 10*3/uL (ref 1.4–6.5)
Neutrophils Relative %: 48 %
PLATELETS: 305 10*3/uL (ref 150–440)
RBC: 3.56 MIL/uL — ABNORMAL LOW (ref 3.80–5.20)
RDW: 15.4 % — AB (ref 11.5–14.5)
WBC: 4.9 10*3/uL (ref 3.6–11.0)

## 2016-04-06 LAB — FOLATE
Folate: 75 ng/mL (ref >5.9)
Folate: >46 ng/mL (ref >5.9)

## 2016-04-06 LAB — TSH: TSH: 1.294 u[IU]/mL (ref 0.350–4.500)

## 2016-04-06 LAB — MAGNESIUM: MAGNESIUM: 2 mg/dL (ref 1.7–2.4)

## 2016-04-07 ENCOUNTER — Encounter
Admission: RE | Admit: 2016-04-07 | Discharge: 2016-04-07 | Disposition: A | Discharge status: Home / Self Care | Payer: Medicare Other | Source: Ambulatory Visit | Attending: Internal Medicine | Admitting: Internal Medicine

## 2016-04-07 LAB — VITAMIN D 25 HYDROXY (VIT D DEFICIENCY, FRACTURES): VIT D 25 HYDROXY: 73.7 ng/mL (ref 30.0–100.0)

## 2016-04-22 DIAGNOSIS — D510 Vitamin B12 deficiency anemia due to intrinsic factor deficiency: Secondary | ICD-10-CM | POA: Insufficient documentation

## 2016-04-22 NOTE — Progress Notes (Signed)
Location:      Place of Service:  SNF (31) Provider:  Toni Arthurs, NP-C  Rusty Aus, MD  Patient Care Team: Rusty Aus, MD as PCP - General (Unknown Physician Specialty)  Extended Emergency Contact Information Primary Emergency Contact: Evorn Gong, [place] Phone: [phone] Relation: Daughter Secondary Emergency Contact: Champ Mungo Address: [place],  [address] [place] Phone: [phone] Relation: Son  Code Status:  full Goals of care: Advanced Directive information Advanced Directives 07/08/2015  Does patient have an advance directive? No  Would patient like information on creating an advanced directive? Yes - Environmental education officer Complaint  Patient presents with  . Acute Visit    HPI:  Pt is a 75 y.o. female seen today for an acute visit for c/o sore mouth/ throat and hyponatremia. Pt reports her  Tongue is sore, throat is inhibiting her ability to swallow. She describes the pain as sharp and constant. Nothing relieves the pain, Nothing specific making it worse. Pt denies sx of Post nasal drip. Denies cough, congestion, sinus pressure. Pt denies n/v/d/f/c/sp/sob/ha/abd pain. 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 She takes large doses of folic acid. She c/o dizziness at times. Pt is found to be slightly hyponatremic. Pt is in good spirits and is working well with PT. VSS. No other c/o.   Past Medical History:  Diagnosis Date  . Abdominal pain   . Anxiety   . Arthritis, rheumatoid ([place])   . Breast pain   . Chest discomfort   . Chest pain   . Chronic pain syndrome   . Collagen vascular disease ([place])   . Constipation   . COPD (chronic obstructive pulmonary disease) ([place])   . Diverticulosis   . Fibrocystic breast disease   . Gastritis   . GERD (gastroesophageal reflux disease)   .  Hemorrhoids   . Hx of colonic polyps   . Hypothyroidism   . Osteopenia   . Renal artery stenosis ([employer])   . Right ankle pain   . Thyroid binding globulin deficiency   . Tobacco abuse    Past Surgical History:  Procedure Laterality Date  . CHOLECYSTECTOMY    . LEG SURGERY    . TONSILLECTOMY      Allergies  Allergen Reactions  . Etrafon [Perphenazine-Amitriptyline]   . Hydrocodone Nausea And Vomiting  . Mirtazapine     Other reaction(s): Hallucination  . Doxycycline Nausea Only  . Percocet [Oxycodone-Acetaminophen] Nausea And Vomiting      Medication List       Accurate as of 04/05/16 11:59 PM. Always use your most recent med list.          acidophilus Caps capsule Take 1 capsule by mouth daily.   ALPRAZolam 0.5 MG tablet Commonly known as:  XANAX Take 0.5 mg by mouth 2 (two) times daily as needed.   CALCIUM 1200 PO Take 2 tablets by mouth daily.   cefUROXime 250 MG tablet Commonly known as:  CEFTIN Take 1 tablet by mouth 2 (two) times daily.   cyanocobalamin 1000 MCG/ML injection Commonly known as:  (VITAMIN B-12) Inject 1,000 mcg into the muscle every 14 (fourteen) days. Pt gets every 6 weeks   cyclobenzaprine 10 MG tablet Commonly known as:  FLEXERIL Take 1 tablet  by mouth 3 (three) times daily as needed.   fluticasone 50 MCG/ACT nasal spray Commonly known as:  FLONASE Place 2 sprays into the nose daily.   folic acid 1 MG tablet Commonly known as:  FOLVITE Take 1 tablet by mouth daily.   hydroxychloroquine 200 MG tablet Commonly known as:  PLAQUENIL TAKE ONE TABLET TWICE DAILY   levothyroxine 88 MCG tablet Commonly known as:  SYNTHROID, LEVOTHROID Take 88 mcg by mouth daily.   Magnesium 250 MG Tabs Take 1 tablet by mouth daily.   magnesium citrate Soln Take 296 mLs (1 Bottle total) by mouth once.   methotrexate (PF) 200 MG/8ML injection ADMINISTER 0.8MLS ONCE A WEEK   multivitamin with minerals tablet Take 1 tablet by mouth daily.     omeprazole 20 MG capsule Commonly known as:  PRILOSEC Take 20 mg by mouth.   omeprazole 40 MG capsule Commonly known as:  PRILOSEC Take 1 capsule by mouth daily.   polyethylene glycol powder powder Commonly known as:  GLYCOLAX/MIRALAX Dissolved 1 tablespoon in 4-8 ounces of water or juice up and drink up to 3 times daily as needed for constipation.   senna 8.6 MG tablet Commonly known as:  SENOKOT Take 16 mg by mouth.   vitamin C 500 MG tablet Commonly known as:  ASCORBIC ACID Take 500 mg by mouth daily.   VITAMIN D (CHOLECALCIFEROL) PO Take 1 tablet by mouth daily.       Review of Systems  Constitutional: Negative for activity change, appetite change, chills, diaphoresis and fever.  HENT: Positive for mouth sores. Negative for congestion, drooling, ear pain, facial swelling, sinus pain, sinus pressure, sneezing, sore throat, trouble swallowing and voice change.   Eyes: Negative for pain, redness and visual disturbance.  Respiratory: Negative for apnea, cough, choking, chest tightness, shortness of breath and wheezing.   Cardiovascular: Negative for chest pain, palpitations and leg swelling.  Gastrointestinal: Negative for abdominal distention, abdominal pain, constipation, diarrhea and nausea.  Genitourinary: Negative for difficulty urinating, dysuria, frequency and urgency.  Musculoskeletal: Negative for back pain, gait problem and myalgias. Arthralgias: typical arthritis.  Skin: Negative for color change, pallor, rash and wound.  Neurological: Negative for dizziness, tremors, syncope, speech difficulty, weakness, numbness and headaches.  Psychiatric/Behavioral: Negative for agitation and behavioral problems.  All other systems reviewed and are negative.    There is no immunization history on file for this patient. Pertinent  Health Maintenance Due  Topic Date Due  . DEXA SCAN  11/17/2006  . PNA vac Low Risk Adult (1 of 2 - PCV13) 11/17/2006  . COLONOSCOPY   04/07/2014  . INFLUENZA VACCINE  01/06/2016  . MAMMOGRAM  06/02/2017   No flowsheet data found. Functional Status Survey:    Vitals:   04/05/16 0430  BP: (!) 141/77  Pulse: 82  Resp: 20  Temp: 98.1 F (36.7 C)  SpO2: 96%   There is no height or weight on file to calculate BMI. Physical Exam  Constitutional: She is oriented to person, place, and time. Vital signs are normal. She appears well-developed and well-nourished. She is active and cooperative. She does not appear ill. No distress.  HENT:  Head: Normocephalic and atraumatic.  Mouth/Throat: Uvula is midline, oropharynx is clear and moist and mucous membranes are normal. Mucous membranes are not pale, not dry and not cyanotic. No oral lesions. No uvula swelling or dental caries. No oropharyngeal exudate, posterior oropharyngeal edema, posterior oropharyngeal erythema or tonsillar abscesses.  Eyes: Conjunctivae, EOM and lids are  normal. Pupils are equal, round, and reactive to light.  Neck: Trachea normal, normal range of motion and full passive range of motion without pain. Neck supple. No JVD present. No tracheal deviation, no edema and no erythema present. No thyromegaly present.  Cardiovascular: Normal rate, regular rhythm, normal heart sounds, intact distal pulses and normal pulses.  Exam reveals no gallop, no distant heart sounds and no friction rub.   No murmur heard. Pulmonary/Chest: Effort normal and breath sounds normal. No accessory muscle usage. No respiratory distress. She has no decreased breath sounds. She has no wheezes. She has no rhonchi. She has no rales. She exhibits no tenderness.  Abdominal: Soft. Normal appearance and bowel sounds are normal. She exhibits no distension and no ascites. There is no tenderness.  Musculoskeletal: Normal range of motion. She exhibits no edema or tenderness.  Expected osteoarthritis, stiffness  Lymphadenopathy:       Head (right side): No submental, no submandibular, no tonsillar  and no preauricular adenopathy present.       Head (left side): No submental, no submandibular, no tonsillar and no preauricular adenopathy present.    She has no cervical adenopathy.    She has no axillary adenopathy.  Neurological: She is alert and oriented to person, place, and time. She has normal strength.  Skin: Skin is warm, dry and intact. She is not diaphoretic. No cyanosis. No pallor. Nails show no clubbing.  Psychiatric: She has a normal mood and affect. Her speech is normal and behavior is normal. Judgment and thought content normal. Cognition and memory are normal.  Nursing note and vitals reviewed.   Labs reviewed:  Recent Labs  07/29/15 0936 04/05/16 1500 04/06/16 0900  NA 126* 130* 131*  K 3.0* 4.1 3.6  CL 89* 95* 96*  CO2 '30 31 27  ' GLUCOSE 87 81 100*  BUN '8 11 8  ' CREATININE 0.73 0.57 0.72  CALCIUM 8.8* 8.6* 8.4*  MG  --  1.9 2.0    Recent Labs  07/29/15 0936 04/05/16 1500 04/06/16 0900  AST 40 25 26  ALT 35 14 11*  ALKPHOS 66 108 85  BILITOT 1.3* 0.5 0.3  PROT 6.3* 6.3* 5.5*  ALBUMIN 4.0 3.5 3.0*    Recent Labs  07/08/15 0953 07/29/15 0936 04/06/16 0900  WBC 7.5 6.6 4.9  NEUTROABS 5.8  --  2.3  HGB 12.7 13.7 11.3*  HCT 35.8 38.4 33.3*  MCV 92.4 92.0 93.7  PLT 332 312 305   Lab Results  Component Value Date   TSH 1.294 04/06/2016   No results found for: HGBA1C No results found for: CHOL, HDL, LDLCALC, LDLDIRECT, TRIG, CHOLHDL  Significant Diagnostic Results in last 30 days:  No results found.  Assessment/Plan 1. Stomatitis  Warm, salt water gargles- swish, spit 30 ml salt water QID  chloroseptic spray- 2 sprays in throat Q 2 hours prn. OK to leave at bedside.   2. Vitamin B12 deficiency anemia due to intrinsic factor deficiency  Supratherapeutic levels  Decreased Folic acid to 909 mcg po daily    3. Hyponatremia  1 liter/ day free water restriction  Gatorade BID  No sodium restriction  Family/ staff Communication:     Total Time:  Documentation:  Face to Face:  Family/Phone:   Labs/tests ordered:  Cbc, met c, folic acid level, Vitamin B12, Vitamin D, Magnesium,   Medication list reviewed and assessed for continued appropriateness.  Vikki Ports, NP-C Geriatrics [facility] [phone]  [place], [place] [address] Cell Phone (Mon-Fri 8am-5pm):  [phone] On Call:  [phone] & follow prompts after 5pm & weekends Office Phone:  [phone] Office Fax:  [phone]

## 2016-04-23 LAB — CULTURE, GROUP A STREP (THRC)

## 2016-05-07 ENCOUNTER — Encounter: Admission: RE | Admit: 2016-05-07 | Payer: Medicare Other | Source: Ambulatory Visit | Admitting: Internal Medicine

## 2016-06-08 DIAGNOSIS — M818 Other osteoporosis without current pathological fracture: Secondary | ICD-10-CM | POA: Insufficient documentation

## 2016-06-09 ENCOUNTER — Other Ambulatory Visit: Payer: Self-pay | Admitting: Internal Medicine

## 2016-06-09 DIAGNOSIS — Z1231 Encounter for screening mammogram for malignant neoplasm of breast: Secondary | ICD-10-CM

## 2016-07-15 ENCOUNTER — Ambulatory Visit
Admission: RE | Admit: 2016-07-15 | Discharge: 2016-07-15 | Disposition: A | Discharge status: Home / Self Care | Payer: Medicare Other | Source: Ambulatory Visit | Attending: Internal Medicine | Admitting: Internal Medicine

## 2016-07-15 DIAGNOSIS — Z1231 Encounter for screening mammogram for malignant neoplasm of breast: Secondary | ICD-10-CM | POA: Insufficient documentation

## 2016-09-27 ENCOUNTER — Ambulatory Visit (INDEPENDENT_AMBULATORY_CARE_PROVIDER_SITE_OTHER): Payer: Medicare Other | Admitting: Internal Medicine

## 2016-09-27 ENCOUNTER — Encounter: Payer: Self-pay | Admitting: Internal Medicine

## 2016-09-27 VITALS — Ht 63.0 in | Wt 99.0 lb

## 2016-09-27 DIAGNOSIS — R911 Solitary pulmonary nodule: Secondary | ICD-10-CM | POA: Diagnosis not present

## 2016-09-27 NOTE — Progress Notes (Signed)
[facility] Consultation      Date: 09/27/2016,   MRN# [id number] Threasa Kinch Witman 1940/06/18 Code Status:  Code Status History    This patient does not have a recorded code status. Please follow your organizational policy for patients in this situation.     Hosp day:@LENGTHOFSTAYDAYS @ Referring MD: @ATDPROV @     PCP:      AdmissionWeight: 99 lb (44.9 kg)                 CurrentWeight: 99 lb (44.9 kg) Sevannah June Layne is a 76 y.o. old female seen in consultation for abnormal CXR at the request of Dr. Sabra Heck     CHIEF COMPLAINT:   Tell me what My reports syays   HISTORY OF PRESENT ILLNESS   76 yo pleasant white female with long standing smoking history current 1 ppd for 50 years  Patient here to discuss her abnormal report of her CXR that was performed 03/2016 in [facility] just before she had her hip replaced  CXR Report shows haziness in lower lungs  Patient has no acute or chronic resp issues at this time She denies fevers, cough, SOB, DOE  No signs of infection at this time   Previous CT chest in 2016 shows very small LUL nodule     PAST MEDICAL HISTORY   Past Medical History:  Diagnosis Date  . Abdominal pain   . Anxiety   . Arthritis, rheumatoid ([place])   . Breast pain   . Chest discomfort   . Chest pain   . Chronic pain syndrome   . Collagen vascular disease ([place])   . Constipation   . COPD (chronic obstructive pulmonary disease) ([place])   . Diverticulosis   . Fibrocystic breast disease   . Gastritis   . GERD (gastroesophageal reflux disease)   . Hemorrhoids   . Hx of colonic polyps   . Hypothyroidism   . Osteopenia   . Renal artery stenosis ([place])   . Right ankle pain   . Thyroid binding globulin deficiency   . Tobacco abuse      SURGICAL HISTORY   Past Surgical History:  Procedure Laterality Date  . CHOLECYSTECTOMY    . LEG SURGERY    . TONSILLECTOMY       FAMILY HISTORY   Family History  Problem  Relation Age of Onset  . Heart disease Sister   . Hypertension Brother   . Heart attack Brother   . Hyperlipidemia Brother   . Breast cancer Neg Hx      SOCIAL HISTORY   Social History  Substance Use Topics  . Smoking status: Current Every Day Smoker    Packs/day: 1.00    Types: Cigarettes  . Smokeless tobacco: Never Used  . Alcohol use No     MEDICATIONS    Home Medication:  Current Outpatient Rx  . Order #: [id number] Class: Historical Med  . Order #: [id number] Class: Historical Med  . Order #: [id number] Class: Historical Med  . Order #: [id number] Class: Historical Med  . Order #: [id number] Class: Historical Med  . Order #: [id number] Class: Historical Med  . Order #: [id number] Class: Historical Med  . Order #: [id number] Class: Historical Med  . Order #: [id number] Class: Historical Med  . Order #: [id number] Class: Historical Med  . Order #: [id number] Class: Historical Med  . Order #: [id number] Class: Print  . Order #: [id number] Class: Historical Med  . Order #: [id number] Class: Historical Med  . Order #: [id number] Class: Historical Med  . Order #:  [id number] Class: Historical Med  . Order #: [id number] Class: Print  . Order #: [id number] Class: Historical Med  . Order #: [id number] Class: Historical Med  . Order #: [id number] Class: Historical Med    Current Medication:  Current Outpatient Prescriptions:  .  acidophilus (RISAQUAD) CAPS capsule, Take 1 capsule by mouth daily., Disp: , Rfl:  .  ALPRAZolam (XANAX) 0.5 MG tablet, Take 0.5 mg by mouth 2 (two) times daily as needed. , Disp: , Rfl:  .  Calcium Carbonate-Vit D-Min (CALCIUM 1200 PO), Take 2 tablets by mouth daily., Disp: , Rfl:  .  cefUROXime (CEFTIN) 250 MG tablet, Take 1 tablet by mouth 2 (two) times daily., Disp: , Rfl:  .  cyanocobalamin (,VITAMIN B-12,) 1000 MCG/ML injection, Inject 1,000 mcg into the muscle every 14 (fourteen) days. Pt gets every 6 weeks, Disp: , Rfl:  .  cyclobenzaprine (FLEXERIL) 10 MG tablet, Take 1 tablet by  mouth 3 (three) times daily as needed., Disp: , Rfl:  .  fluticasone (FLONASE) 50 MCG/ACT nasal spray, Place 2 sprays into the nose daily., Disp: , Rfl:  .  folic acid (FOLVITE) 1 MG tablet, Take 1 tablet by mouth daily., Disp: , Rfl:  .  hydroxychloroquine (PLAQUENIL) 200 MG tablet, TAKE ONE TABLET TWICE DAILY, Disp: , Rfl:  .  levothyroxine (SYNTHROID, LEVOTHROID) 88 MCG tablet, Take 88 mcg by mouth daily., Disp: , Rfl:  .  Magnesium 250 MG TABS, Take 1 tablet by mouth daily., Disp: , Rfl:  .  magnesium citrate SOLN, Take 296 mLs (1 Bottle total) by mouth once., Disp: 195 mL, Rfl: 0 .  Methotrexate Sodium (METHOTREXATE, PF,) 200 MG/8ML injection, ADMINISTER 0.8MLS ONCE A WEEK, Disp: , Rfl:  .  Multiple Vitamins-Minerals (MULTIVITAMIN WITH MINERALS) tablet, Take 1 tablet by mouth daily., Disp: , Rfl:  .  omeprazole (PRILOSEC) 20 MG capsule, Take 20 mg by mouth., Disp: , Rfl:  .  omeprazole (PRILOSEC) 40 MG capsule, Take 1 capsule by mouth daily., Disp: , Rfl:  .  polyethylene glycol powder (GLYCOLAX/MIRALAX) powder, Dissolved 1 tablespoon in 4-8 ounces of water or juice up and drink up to 3 times daily as needed for constipation., Disp: 255 g, Rfl: 0 .  senna (SENOKOT) 8.6 MG tablet, Take 16 mg by mouth., Disp: , Rfl:  .  vitamin C (ASCORBIC ACID) 500 MG tablet, Take 500 mg by mouth daily., Disp: , Rfl:  .  VITAMIN D, CHOLECALCIFEROL, PO, Take 1 tablet by mouth daily., Disp: , Rfl:     ALLERGIES   Etrafon [perphenazine-amitriptyline]; Hydrocodone; Mirtazapine; Doxycycline; and Percocet [oxycodone-acetaminophen]     REVIEW OF SYSTEMS   Review of Systems  Constitutional: Negative for chills, diaphoresis, fever, malaise/fatigue and weight loss.  HENT: Negative for congestion and hearing loss.   Eyes: Negative for blurred vision and double vision.  Respiratory: Negative for cough, hemoptysis, sputum production, shortness of breath and wheezing.   Cardiovascular: Negative for chest pain,  palpitations and orthopnea.  Gastrointestinal: Negative for abdominal pain, heartburn, nausea and vomiting.  Genitourinary: Negative for dysuria and urgency.  Musculoskeletal: Positive for joint pain. Negative for back pain, myalgias and neck pain.  Skin: Negative for rash.  Neurological: Negative for dizziness and weakness.  Endo/Heme/Allergies: Does not bruise/bleed easily.  Psychiatric/Behavioral: The patient is not nervous/anxious.   All other systems reviewed and are negative.    VS: Ht 5\' 3"  (1.6 m)   Wt 99 lb (44.9 kg)   BMI 17.54 kg/m      PHYSICAL EXAM  Physical Exam  Constitutional: She is oriented to person, place, and time. She appears well-developed and well-nourished. No distress.  HENT:  Head: Normocephalic and atraumatic.  Mouth/Throat: No oropharyngeal exudate.  Eyes: EOM are normal. Pupils are equal, round, and reactive to light. No scleral icterus.  Neck: Normal range of motion. Neck supple.  Cardiovascular: Normal rate, regular rhythm and normal heart sounds.   No murmur heard. Pulmonary/Chest: No stridor. No respiratory distress. She has no wheezes.  Abdominal: Soft. Bowel sounds are normal.  Musculoskeletal: Normal range of motion. She exhibits no edema.  Neurological: She is alert and oriented to person, place, and time. No cranial nerve deficit.  Skin: Skin is warm. She is not diaphoretic.  Psychiatric: She has a normal mood and affect.       IMAGING   CT chest 03/20/2015 I have Independently reviewed images of  Ct chest   on 09/27/2016 Interpretation: small nodule left apex     ASSESSMENT/PLAN   76 yo  pleasant white female with long standing history of smoking with previous LUL nodule and CXR reprot with Haziness  1.will need to obtain CT chest without contrast 2.follow up after results obtained   Patient satisfied with Plan of action and management. All questions answered  Corrin Parker, M.D.  [facility]  Medical Director [place] Director [facility] Cardio-Pulmonary Department

## 2016-09-27 NOTE — Patient Instructions (Signed)
Obtain CT chest and follow up after results Recommend smoking cesstaion

## 2016-09-29 ENCOUNTER — Ambulatory Visit
Admission: RE | Admit: 2016-09-29 | Discharge: 2016-09-29 | Disposition: A | Discharge status: Home / Self Care | Payer: Medicare Other | Source: Ambulatory Visit | Attending: Internal Medicine | Admitting: Internal Medicine

## 2016-09-29 DIAGNOSIS — R911 Solitary pulmonary nodule: Secondary | ICD-10-CM | POA: Diagnosis present

## 2016-10-04 ENCOUNTER — Encounter: Payer: Self-pay | Admitting: Internal Medicine

## 2016-10-04 ENCOUNTER — Ambulatory Visit (INDEPENDENT_AMBULATORY_CARE_PROVIDER_SITE_OTHER): Payer: Medicare Other | Admitting: Internal Medicine

## 2016-10-04 ENCOUNTER — Ambulatory Visit: Payer: Medicare Other

## 2016-10-04 VITALS — BP 120/80 | HR 72 | Resp 16 | Ht 63.0 in | Wt 98.0 lb

## 2016-10-04 DIAGNOSIS — R911 Solitary pulmonary nodule: Secondary | ICD-10-CM

## 2016-10-04 NOTE — Progress Notes (Signed)
[facility] Consultation      Date: 10/04/2016,   MRN# [id number] [name] 13-Nov-1940 Code Status:  Code Status History    This patient does not have a recorded code status. Please follow your organizational policy for patients in this situation.     Hosp day:@LENGTHOFSTAYDAYS @ Referring MD: @ATDPROV @     PCP:      Admission                  Current  [name] is a 76 y.o. old female seen in consultation for abnormal CXR at the request of Dr. Sabra Heck     CHIEF COMPLAINT:   Tell me what My reports syays   HISTORY OF PRESENT ILLNESS   76 yo pleasant white female with long standing smoking history current 1 ppd for 50 years  Patient has no acute or chronic resp issues at this time She denies fevers, cough, SOB, DOE  No signs of infection at this time   Previous CT chest in 2016 shows very small LUL nodule Current CT chest shows stable LUL nodule for approx 2 years   Had seen ENT and was told she has mass in neck-follow up pending   Current Medication:  Current Outpatient Prescriptions:  .  acidophilus (RISAQUAD) CAPS capsule, Take 1 capsule by mouth daily., Disp: , Rfl:  .  Calcium Carbonate-Vit D-Min (CALCIUM 1200 PO), Take 2 tablets by mouth daily., Disp: , Rfl:  .  clonazePAM (KLONOPIN) 0.5 MG tablet, Take 0.5 mg by mouth 2 (two) times daily., Disp: , Rfl:  .  cyanocobalamin (,VITAMIN B-12,) 1000 MCG/ML injection, Inject 1,000 mcg into the muscle every 14 (fourteen) days. Pt gets every 6 weeks, Disp: , Rfl:  .  dicyclomine (BENTYL) 10 MG capsule, Take 10 mg by mouth 4 (four) times daily -  before meals and at bedtime., Disp: , Rfl:  .  folic acid (FOLVITE) 1 MG tablet, Take 1 tablet by mouth daily., Disp: , Rfl:  .  hydroxychloroquine (PLAQUENIL) 200 MG tablet, TAKE ONE TABLET TWICE DAILY, Disp: , Rfl:  .  levothyroxine (SYNTHROID, LEVOTHROID) 88 MCG tablet, Take 88 mcg by mouth daily., Disp: , Rfl:  .  Melatonin 5 MG  TABS, Take 5 mg by mouth at bedtime., Disp: , Rfl:  .  Methotrexate Sodium (METHOTREXATE, PF,) 200 MG/8ML injection, ADMINISTER 0.8MLS ONCE A WEEK, Disp: , Rfl:  .  Multiple Vitamins-Minerals (MULTIVITAMIN WITH MINERALS) tablet, Take 1 tablet by mouth daily., Disp: , Rfl:  .  omeprazole (PRILOSEC) 40 MG capsule, Take 1 capsule by mouth daily., Disp: , Rfl:  .  polyethylene glycol powder (GLYCOLAX/MIRALAX) powder, Dissolved 1 tablespoon in 4-8 ounces of water or juice up and drink up to 3 times daily as needed for constipation., Disp: 255 g, Rfl: 0 .  senna (SENOKOT) 8.6 MG tablet, Take 16 mg by mouth., Disp: , Rfl:  .  vitamin C (ASCORBIC ACID) 500 MG tablet, Take 500 mg by mouth daily., Disp: , Rfl:  .  VITAMIN D, CHOLECALCIFEROL, PO, Take 1 tablet by mouth daily., Disp: , Rfl:     ALLERGIES   Etrafon [perphenazine-amitriptyline]; Hydrocodone; Mirtazapine; Doxycycline; and Percocet [oxycodone-acetaminophen]     REVIEW OF SYSTEMS   Review of Systems  Constitutional: Negative for chills, diaphoresis, fever, malaise/fatigue and weight loss.  HENT: Negative for congestion and hearing loss.   Respiratory: Negative for cough, hemoptysis, sputum production, shortness of breath and wheezing.   Cardiovascular: Negative for chest  pain, palpitations, orthopnea and leg swelling.  Gastrointestinal: Negative for heartburn.  Musculoskeletal: Positive for joint pain. Negative for back pain, myalgias and neck pain.  Skin: Negative for rash.  Neurological: Negative for weakness.  All other systems reviewed and are negative.    BP 120/80 (BP Location: Left Arm, Patient Position: Sitting, Cuff Size: Normal)   Pulse 72   Resp 16   Ht 5\' 3"  (1.6 m)   Wt 98 lb (44.5 kg)   SpO2 96%   BMI 17.36 kg/m    PHYSICAL EXAM  Physical Exam  Constitutional: She is oriented to person, place, and time. She appears well-developed and well-nourished. No distress.  HENT:  Mouth/Throat: No oropharyngeal  exudate.  Neck: Neck supple.  Cardiovascular: Normal rate, regular rhythm and normal heart sounds.   No murmur heard. Pulmonary/Chest: Effort normal and breath sounds normal. No stridor. No respiratory distress. She has no wheezes.  Musculoskeletal: Normal range of motion. She exhibits no edema.  Neurological: She is alert and oriented to person, place, and time. No cranial nerve deficit.  Skin: Skin is warm. She is not diaphoretic.  Psychiatric: She has a normal mood and affect.        ASSESSMENT/PLAN   76 yo  pleasant white female with long standing history of smoking with previous LUL nodule that has been stable for approx 2 years. Will follow up in 1 year. No further recs at this time.   Smoking cessation strongly advised   Patient satisfied with Plan of action and management. All questions answered  Corrin Parker, M.D.  [facility]  Medical Director [place] Director [facility] Cardio-Pulmonary Department

## 2016-10-04 NOTE — Patient Instructions (Signed)
Follow up in 1 year.

## 2016-10-23 ENCOUNTER — Emergency Department: Payer: Medicare Other

## 2016-10-23 ENCOUNTER — Emergency Department
Admission: EM | Admit: 2016-10-23 | Discharge: 2016-10-23 | Disposition: A | Discharge status: Home / Self Care | Payer: Medicare Other | Attending: Emergency Medicine | Admitting: Emergency Medicine

## 2016-10-23 DIAGNOSIS — M7061 Trochanteric bursitis, right hip: Secondary | ICD-10-CM

## 2016-10-23 DIAGNOSIS — Z79899 Other long term (current) drug therapy: Secondary | ICD-10-CM | POA: Diagnosis not present

## 2016-10-23 DIAGNOSIS — F17210 Nicotine dependence, cigarettes, uncomplicated: Secondary | ICD-10-CM | POA: Diagnosis not present

## 2016-10-23 DIAGNOSIS — E039 Hypothyroidism, unspecified: Secondary | ICD-10-CM | POA: Insufficient documentation

## 2016-10-23 DIAGNOSIS — S9031XA Contusion of right foot, initial encounter: Secondary | ICD-10-CM | POA: Insufficient documentation

## 2016-10-23 DIAGNOSIS — Y9301 Activity, walking, marching and hiking: Secondary | ICD-10-CM | POA: Diagnosis not present

## 2016-10-23 DIAGNOSIS — J449 Chronic obstructive pulmonary disease, unspecified: Secondary | ICD-10-CM | POA: Insufficient documentation

## 2016-10-23 DIAGNOSIS — Y929 Unspecified place or not applicable: Secondary | ICD-10-CM | POA: Insufficient documentation

## 2016-10-23 DIAGNOSIS — S79911A Unspecified injury of right hip, initial encounter: Secondary | ICD-10-CM | POA: Diagnosis present

## 2016-10-23 DIAGNOSIS — W19XXXA Unspecified fall, initial encounter: Secondary | ICD-10-CM | POA: Diagnosis not present

## 2016-10-23 DIAGNOSIS — Y998 Other external cause status: Secondary | ICD-10-CM | POA: Insufficient documentation

## 2016-10-23 DIAGNOSIS — M25551 Pain in right hip: Secondary | ICD-10-CM

## 2016-10-23 MED ORDER — PREDNISONE 10 MG PO TABS: 10.0000 mg | ORAL_TABLET | Freq: Every day | ORAL | 0 refills | Status: DC

## 2016-10-23 MED ORDER — PREDNISONE 20 MG PO TABS
60.0000 mg | ORAL_TABLET | Freq: Once | ORAL | Status: AC
  Administered 2016-10-23: 60 mg via ORAL
  Filled 2016-10-23: qty 3

## 2016-10-23 NOTE — ED Triage Notes (Signed)
Patient reports hx of right hip surgery in October. Pt fell 2 days ago and c/o right hip pain with ambulation.  Patient's walker fell onto right foot yesterday. Pt has visible swelling/bruising to right foot, and reduced mobility of foot.

## 2016-10-23 NOTE — Discharge Instructions (Signed)
Please rest ice and elevate the right foot. He is postop shoe to help with ambulation. Use walker to help with ambulation. Apply heating pad to the right hip. Take prednisone as prescribed. Follow-up with your primary care doctor, orthopedist or Dr. Marry Guan in one week if no improvement.

## 2016-10-23 NOTE — ED Provider Notes (Signed)
[facility] Provider Note   CSN: [id number] Arrival date & time: 10/23/16  1942     History   Chief Complaint Chief Complaint  Patient presents with  . Fall  . Extremity Pain    right foot and hip    HPI [name] is a 76 y.o. female presents to the emergency department for evaluation of right foot and right hip pain. Patient states that her walker fell onto her right foot yesterday. She developed pain and swelling and bruising. She's also had right hip pain since her surgery in October that has not improved. Yesterday she fell 2 days ago and developed slight increase in pain along the lateral trochanteric bursa. She denies any groin pain. Patient denies any other injuries to her body. Fall was mechanical. No chest pain, shortness of breath, dizziness or lightheadedness. No head trauma, headaches, nausea or vomiting. She continued with ambulation. She is requesting x-ray of her right hip today due to the fall 2 days ago. Her chief complaint today is her right foot pain. Patient remains ambulatory with a walker but has a limp on the right foot. She has taken tramadol for pain. Has a prescription for tramadol home, tolerates this medication well.  HPI  Past Medical History:  Diagnosis Date  . Abdominal pain   . Anxiety   . Arthritis, rheumatoid ([place])   . Breast pain   . Chest discomfort   . Chest pain   . Chronic pain syndrome   . Collagen vascular disease ([place])   . Constipation   . COPD (chronic obstructive pulmonary disease) ([place])   . Diverticulosis   . Fibrocystic breast disease   . Gastritis   . GERD (gastroesophageal reflux disease)   . Hemorrhoids   . Hx of colonic polyps   . Hypothyroidism   . Osteopenia   . Renal artery stenosis ([place])   . Right ankle pain   . Thyroid binding globulin deficiency   . Tobacco abuse     Patient Active Problem List   Diagnosis Date Noted  . Vitamin B12 deficiency anemia due to intrinsic factor deficiency  04/22/2016  . RA (rheumatoid arthritis) ([place]) 02/18/2015  . Arthritis, rheumatoid ([place])   . Thyroid binding globulin deficiency   . Breast pain   . GERD (gastroesophageal reflux disease)   . Tobacco abuse   . Chest discomfort   . Abdominal pain   . Hypothyroidism   . Renal artery stenosis ([place])   . COPD (chronic obstructive pulmonary disease) ([place])   . Hx of colonic polyps   . Anxiety   . Hemorrhoids   . Gastritis   . Diverticulosis   . Fibrocystic breast disease   . Osteopenia   . Constipation   . CHRONIC PAIN SYNDROME 08/03/2007    Past Surgical History:  Procedure Laterality Date  . CHOLECYSTECTOMY    . JOINT REPLACEMENT     right hip  . LEG SURGERY    . TONSILLECTOMY      OB History    No data available       Home Medications    Prior to Admission medications   Medication Sig Start Date End Date Taking? Authorizing Provider  acidophilus (RISAQUAD) CAPS capsule Take 1 capsule by mouth daily.    [provider]  Calcium Carbonate-Vit D-Min (CALCIUM 1200 PO) Take 2 tablets by mouth daily.    [provider]  clonazePAM (KLONOPIN) 0.5 MG tablet Take 0.5 mg by mouth 2 (two) times daily.  [provider]  cyanocobalamin (,VITAMIN B-12,) 1000 MCG/ML injection Inject 1,000 mcg into the muscle every 14 (fourteen) days. Pt gets every 6 weeks    [provider]  dicyclomine (BENTYL) 10 MG capsule Take 10 mg by mouth 4 (four) times daily -  before meals and at bedtime.    [provider]  estradiol (ESTRACE) 0.1 MG/GM vaginal cream Place 0.1 g vaginally 2 (two) times a week. 08/11/16   [provider]  FLUoxetine (PROZAC) 20 MG capsule Take 20 mg by mouth daily. 08/30/16   [provider]  folic acid (FOLVITE) 1 MG tablet Take 1 tablet by mouth daily. 10/21/14   [provider]  hydroxychloroquine (PLAQUENIL) 200 MG tablet TAKE ONE TABLET TWICE DAILY 01/03/15   [provider]  levothyroxine  (SYNTHROID, LEVOTHROID) 88 MCG tablet Take 88 mcg by mouth daily.    [provider]  Melatonin 5 MG TABS Take 5 mg by mouth at bedtime.    [provider]  Methotrexate Sodium (METHOTREXATE, PF,) 200 MG/8ML injection ADMINISTER 0.8MLS ONCE A WEEK 11/27/14   [provider]  Multiple Vitamins-Minerals (MULTIVITAMIN WITH MINERALS) tablet Take 1 tablet by mouth daily.    [provider]  omeprazole (PRILOSEC) 40 MG capsule Take 1 capsule by mouth daily. 07/03/15   [provider]  polyethylene glycol powder (GLYCOLAX/MIRALAX) powder Dissolved 1 tablespoon in 4-8 ounces of water or juice up and drink up to 3 times daily as needed for constipation. 07/08/15   Joanne Gavel, MD  predniSONE (DELTASONE) 10 MG tablet Take 1 tablet (10 mg total) by mouth daily. 6,5,4,3,2,1 six day taper 10/23/16   Duanne Guess, PA-C  senna (SENOKOT) 8.6 MG tablet Take 16 mg by mouth.    [provider]  vitamin C (ASCORBIC ACID) 500 MG tablet Take 500 mg by mouth daily.    [provider]  VITAMIN D, CHOLECALCIFEROL, PO Take 1 tablet by mouth daily.    [provider]    Family History Family History  Problem Relation Age of Onset  . Heart disease Sister   . Hypertension Brother   . Heart attack Brother   . Hyperlipidemia Brother   . Breast cancer Neg Hx     Social History Social History  Substance Use Topics  . Smoking status: Current Every Day Smoker    Packs/day: 1.00    Types: Cigarettes  . Smokeless tobacco: Never Used  . Alcohol use No     Allergies   Etrafon [perphenazine-amitriptyline]; Hydrocodone; Mirtazapine; Doxycycline; and Percocet [oxycodone-acetaminophen]   Review of Systems Review of Systems  Constitutional: Negative for activity change, chills, fatigue and fever.  HENT: Negative for congestion, sinus pressure and sore throat.   Eyes: Negative for visual disturbance.  Respiratory: Negative for cough, chest  tightness and shortness of breath.   Cardiovascular: Negative for chest pain and leg swelling.  Gastrointestinal: Negative for abdominal pain, diarrhea, nausea and vomiting.  Genitourinary: Negative for dysuria.  Musculoskeletal: Positive for arthralgias, gait problem and joint swelling. Negative for neck pain.  Skin: Negative for rash.  Neurological: Negative for weakness, numbness and headaches.  Hematological: Negative for adenopathy.  Psychiatric/Behavioral: Negative for agitation, behavioral problems and confusion.     Physical Exam Updated Vital Signs BP 108/68 (BP Location: Right Arm)   Pulse 91   Temp 98.4 F (36.9 C) (Oral)   Resp 18   Ht 5\' 4"  (1.626 m)   Wt 95 lb (43.1 kg)  SpO2 99%   BMI 16.31 kg/m   Physical Exam  Constitutional: She is oriented to person, place, and time. She appears well-developed and well-nourished. No distress.  HENT:  Head: Normocephalic and atraumatic.  Mouth/Throat: Oropharynx is clear and moist.  Eyes: EOM are normal. Pupils are equal, round, and reactive to light. Right eye exhibits no discharge. Left eye exhibits no discharge.  Neck: Normal range of motion. Neck supple.  Cardiovascular: Normal rate, regular rhythm and intact distal pulses.   Pulmonary/Chest: Effort normal and breath sounds normal. No respiratory distress. She has no wheezes. She has no rales. She exhibits no tenderness.  Abdominal: Soft. She exhibits no distension. There is no tenderness.  Musculoskeletal: Normal range of motion. She exhibits no edema.  Examination of the right hip shows the patient has no pain with hip internal or external rotation. She has no pain with hip flexion. She is tender along the trochanteric bursa. No signs of warmth and swelling erythema or infection along the hip. She has mild swelling throughout the right foot with ecchymosis. There is no warmth or redness. There is tenderness palpation along the dorsal aspect of the tarsal bones. No skin  breakdown noted. No ulcerations. His neuro rest intact in right lower extremity.  Neurological: She is alert and oriented to person, place, and time. She has normal reflexes.  Skin: Skin is warm and dry.  Psychiatric: She has a normal mood and affect. Her behavior is normal. Thought content normal.     ED Treatments / Results  Labs (all labs ordered are listed, but only abnormal results are displayed) Labs Reviewed - No data to display  EKG  EKG Interpretation None       Radiology Dg Foot Complete Right  Result Date: 10/23/2016 CLINICAL DATA:  Fall, right foot pain/ injury EXAM: RIGHT FOOT COMPLETE - 3+ VIEW COMPARISON:  12/28/2014 FINDINGS: No fracture or dislocation is seen. Mild degenerative changes the 1st MTP joint. Remote deformity of the 5th metacarpal head and MTP joint, chronic. Mild soft tissue swelling overlying the dorsal forefoot. IMPRESSION: No fracture or dislocation is seen. Mild dorsal soft tissue swelling. Electronically Signed   By: Julian Hy M.D.   On: 10/23/2016 20:51   Dg Hip Unilat W Or Wo Pelvis 2-3 Views Right  Result Date: 10/23/2016 CLINICAL DATA:  Hip surgery October 2017. Fall 2 days ago with right hip pain. EXAM: DG HIP (WITH OR WITHOUT PELVIS) 2-3V RIGHT COMPARISON:  07/29/2015 FINDINGS: There is diffuse osteopenia. Hardware intact over the right femur. Mild irregularity over the right lesser trochanter with fairly well corticated 1.8 cm rounded bony fragment projected over the medial right femoral neck as these findings are likely chronic, although difficult to exclude acute injury. Mild degenerative change of the hips. Degenerative change of the spine. IMPRESSION: Hardware over the right femur intact. Changes as described involving the region of the right lesser trochanter likely chronic. Mild degenerate change of the hips. Electronically Signed   By: Marin Olp M.D.   On: 10/23/2016 20:54    Procedures Procedures (including critical care  time) SPLINT APPLICATION Date/Time: 1:88 PM Authorized by: Feliberto Gottron Consent: Verbal consent obtained. Risks and benefits: risks, benefits and alternatives were discussed Consent given by: patient Splint applied by: Nurse Location details: Right foot  Splint type: Postop shoe  Supplies used: Postop shoe Post-procedure: The splinted body part was neurovascularly unchanged following the procedure. Patient tolerance: Patient tolerated the procedure well with no immediate complications.  Medications Ordered in ED Medications  predniSONE (DELTASONE) tablet 60 mg (60 mg Oral Given 10/23/16 2118)     Initial Impression / Assessment and Plan / ED Course  I have reviewed the triage vital signs and the nursing notes.  Pertinent labs & imaging results that were available during my care of the patient were reviewed by me and considered in my medical decision making (see chart for details).     76 year old female with right hip soreness along the trochanteric bursa. There is a prominent hardware along the trochanteric bursa that could be contributing to this pain. No evidence of acute bony abnormality. She is ambulatory with no pain with hip internal or external rotation. She'll follow-up with orthopedics. Continue heating pad to the right hip. Patient with swelling and pain to the right foot. No evidence of acute bony abnormality on x-ray. She is placed into a postop shoe. She is weightbearing as tolerated with walker. She'll take prednisone for foot and hip pain. She can continue with tramadol as needed for moderate to severe pain. Follow-up with orthopedics in one week if no improvement.  Final Clinical Impressions(s) / ED Diagnoses   Final diagnoses:  Hip pain, right  Trochanteric bursitis, right hip  Contusion of right foot, initial encounter    New Prescriptions New Prescriptions   PREDNISONE (DELTASONE) 10 MG TABLET    Take 1 tablet (10 mg total) by mouth daily.  6,5,4,3,2,1 six day taper     Renata Caprice 10/23/16 2123    Earleen Newport, MD 10/25/16 [phone]

## 2016-10-23 NOTE — ED Notes (Signed)
Pt presents with swelling and bruising to her R foot. Pt states that her walker fell on her foot yesterday. Pt has bruising and swelling. + movement, +sensation, cap refill < 3 seconds, skin of foot warm, dry, and intact.

## 2016-10-23 NOTE — ED Notes (Signed)
Pt taken out to car by nurse in w/c

## 2016-11-04 ENCOUNTER — Other Ambulatory Visit: Payer: Self-pay | Admitting: Orthopedic Surgery

## 2016-11-04 DIAGNOSIS — S72141D Displaced intertrochanteric fracture of right femur, subsequent encounter for closed fracture with routine healing: Secondary | ICD-10-CM

## 2016-11-08 ENCOUNTER — Ambulatory Visit
Admission: RE | Admit: 2016-11-08 | Discharge: 2016-11-08 | Disposition: A | Discharge status: Home / Self Care | Payer: Medicare Other | Source: Ambulatory Visit | Attending: Orthopedic Surgery | Admitting: Orthopedic Surgery

## 2016-11-08 ENCOUNTER — Other Ambulatory Visit: Payer: Self-pay | Admitting: Otolaryngology

## 2016-11-08 DIAGNOSIS — X58XXXD Exposure to other specified factors, subsequent encounter: Secondary | ICD-10-CM | POA: Insufficient documentation

## 2016-11-08 DIAGNOSIS — J387 Other diseases of larynx: Secondary | ICD-10-CM | POA: Insufficient documentation

## 2016-11-08 DIAGNOSIS — S72141D Displaced intertrochanteric fracture of right femur, subsequent encounter for closed fracture with routine healing: Secondary | ICD-10-CM | POA: Diagnosis not present

## 2016-11-08 MED ORDER — IOPAMIDOL (ISOVUE-300) INJECTION 61%
75.0000 mL | Freq: Once | INTRAVENOUS | Status: AC | PRN
  Administered 2016-11-08: 75 mL via INTRAVENOUS

## 2016-11-09 ENCOUNTER — Ambulatory Visit
Admission: RE | Admit: 2016-11-09 | Discharge: 2016-11-09 | Disposition: A | Discharge status: Home / Self Care | Payer: Medicare Other | Source: Ambulatory Visit | Attending: Orthopedic Surgery | Admitting: Orthopedic Surgery

## 2016-11-16 ENCOUNTER — Ambulatory Visit (INDEPENDENT_AMBULATORY_CARE_PROVIDER_SITE_OTHER): Payer: Medicare Other | Admitting: Sports Medicine

## 2016-11-16 ENCOUNTER — Ambulatory Visit (INDEPENDENT_AMBULATORY_CARE_PROVIDER_SITE_OTHER): Payer: Medicare Other

## 2016-11-16 ENCOUNTER — Encounter: Payer: Self-pay | Admitting: Sports Medicine

## 2016-11-16 VITALS — BP 120/80 | HR 77 | Ht 64.0 in | Wt 98.2 lb

## 2016-11-16 DIAGNOSIS — Z720 Tobacco use: Secondary | ICD-10-CM

## 2016-11-16 DIAGNOSIS — M818 Other osteoporosis without current pathological fracture: Secondary | ICD-10-CM | POA: Diagnosis not present

## 2016-11-16 DIAGNOSIS — M545 Low back pain: Secondary | ICD-10-CM

## 2016-11-16 DIAGNOSIS — S72141A Displaced intertrochanteric fracture of right femur, initial encounter for closed fracture: Secondary | ICD-10-CM | POA: Diagnosis not present

## 2016-11-16 DIAGNOSIS — M059 Rheumatoid arthritis with rheumatoid factor, unspecified: Secondary | ICD-10-CM

## 2016-11-16 MED ORDER — NITROGLYCERIN 0.2 MG/HR TD PT24: MEDICATED_PATCH | TRANSDERMAL | 1 refills | Status: DC

## 2016-11-16 MED ORDER — GABAPENTIN 100 MG PO CAPS: ORAL_CAPSULE | ORAL | 1 refills | Status: DC

## 2016-11-16 NOTE — Progress Notes (Signed)
OFFICE VISIT NOTE [name], [place] [facility] [place]  [facility] [place] - 76 y.o. female MRN [id number]  Date of birth: [birth year]  Visit Date: 11/16/2016  PCP: Rusty Aus, MD   Referred by: Rusty Aus, MD  Burlene Arnt, CMA acting as scribe for Dr. Paulla Fore.  SUBJECTIVE:   Chief Complaint  Patient presents with  . right hip and lower back pain   HPI: As below and per problem based documentation when appropriate.  Pt presents today with complaint of right hip and lower back pain. She had CT of the right hip done 11/08/16.  Pain started while she was gardening. She states that she has a broken hip and she has been going to PT for this. She says that she was also dx with a gluteal tear, right sided. Pain started 9 days ago.   The pain is described as aching and is rated as 7/10 when walking.  Worsened with walking. She [place] unable to drive her car or ride in a car due to the pain. She can ride in an SUV with less difficultly. Improves with laying down Therapies tried include : Pt has tried Tramadol with no relief. She has also been prescribed Mobic 7.5 mg and Tylenol 500 mg. Pt says that she is not taking the Mobic because she receives Methotrexate injections and she read that there was a medication interaction.   Other associated symptoms include: Pt has pain in the right quad, pack of the knee and right gluteal region.   Pt denies fever, chills, night sweats, unintentional weight loss or weight gain.     Review of Systems  Constitutional: Negative for chills and fever.  Respiratory: Negative for shortness of breath and wheezing.   Cardiovascular: Positive for leg swelling (right leg and ankle). Negative for chest pain and palpitations.  Musculoskeletal: Positive for myalgias. Negative for falls.  Neurological: Negative for dizziness, tingling and headaches.  Endo/Heme/Allergies: Does not bruise/bleed easily.      Otherwise per HPI.  HISTORY & PERTINENT PRIOR DATA:  No specialty comments available. She reports that she has been smoking Cigarettes.  She has been smoking about 1.00 pack per day. She has never used smokeless tobacco. No results for input(s): HGBA1C, LABURIC in the last 8760 hours. Medications & Allergies reviewed per EMR Patient Active Problem List   Diagnosis Date Noted  . Cataracts, bilateral 11/24/2016  . Adenomatous polyps 11/24/2016  . Low back pain 11/24/2016  . Adult idiopathic generalized osteoporosis 06/08/2016  . Vitamin B12 deficiency anemia due to intrinsic factor deficiency 04/22/2016  . Closed intertrochanteric fracture of right femur ([place]) 03/30/2016  . History of nonmelanoma skin cancer 11/17/2015  . Compression fracture of lumbar vertebra ([place]) 05/27/2015  . Valvular heart disease 03/21/2015  . 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  . Pathological fracture due to osteoporosis 03/04/2015  . RA (rheumatoid arthritis) ([place]) 02/18/2015  . Arthritis, rheumatoid ([place])   . Thyroid binding globulin deficiency   . Breast pain   . GERD (gastroesophageal reflux disease)   . Tobacco abuse   . Chest discomfort   . Abdominal pain   . Hypothyroid   . Renal artery stenosis ([place])   . COPD (chronic obstructive pulmonary disease) ([place])   . Hx of colonic polyps   . Anxiety   . Hemorrhoids   . Gastritis   . Diverticulosis   . Fibrocystic breast disease   . Constipation   .  Osteoporosis 10/10/2013  . Pernicious anemia 10/10/2013  . CHRONIC PAIN SYNDROME 08/03/2007   Past Medical History:  Diagnosis Date  . Abdominal pain   . Anxiety   . Arthritis, rheumatoid ([place])   . Breast pain   . Chest discomfort   . Chest pain   . Chronic pain syndrome   . Collagen vascular disease (H[facility])    hx rheumatoid arthritis.   . Constipation   . COPD (chronic obstructive pulmonary disease) ([place])   . Diverticulosis   . Fibrocystic breast disease   . Gastritis   . GERD  (gastroesophageal reflux disease)   . Hemorrhoids   . Hx of colonic polyps   . Hypothyroidism   . Osteopenia   . Renal artery stenosis ([place])   . Right ankle pain   . Thyroid binding globulin deficiency   . Tobacco abuse    Family History  Problem Relation Age of Onset  . Heart disease Sister   . Hypertension Brother   . Heart attack Brother   . Hyperlipidemia Brother   . Breast cancer Neg Hx    Past Surgical History:  Procedure Laterality Date  . CHOLECYSTECTOMY    . JOINT REPLACEMENT     right hip  . LEG SURGERY    . TONSILLECTOMY     Social History   Occupational History  . Not on file.   Social History Main Topics  . Smoking status: Current Every Day Smoker    Packs/day: 1.00    Types: Cigarettes  . Smokeless tobacco: Never Used  . Alcohol use No  . Drug use: No  . Sexual activity: Not on file    OBJECTIVE:  VS:  HT:5\' 4"  (162.6 cm)   WT:98 lb 3.2 oz (44.5 kg)  BMI:16.9    BP:120/80  HR:77bpm  TEMP: ( )  RESP:98 % EXAM: Findings:  WDWN, NAD, Non-toxic appearing Alert & appropriately interactive Not depressed or anxious appearing No increased work of breathing. Pupils are equal. EOM intact without nystagmus No clubbing or cyanosis of the extremities appreciated No significant rashes/lesions/ulcerations overlying the examined area. DP PT pulses 1+/4.  Trace pretibial edema bilaterally. Sensation intact to light touch in lower extremities.  Back & Lower Extremities: Bilateral negative straight leg raise.  Although tight No significant midline tenderness.   Greater sciatic notch in more focally over the glute medius tendon. Good internal and external rotation of the hips.  Marked pain however with axial loading any type of weightbearing over the right hip.  Positive Stinchfield test. Manual muscle testing is 5+/5 in BLE myotomes without focality Lower extremity DTRs 1+/4 diffusely and symmetric      Dg Lumbar Spine Complete  Result Date:  11/16/2016 CLINICAL DATA:  76 year old female with right lower back pain radiating into right hip for the past 9 days. Symptoms started while gardening. Initial encounter. EXAM: LUMBAR SPINE - COMPLETE 4+ VIEW COMPARISON:  07/08/2015 CT. FINDINGS: Scoliosis lumbar spine convex right. Small Schmorl's node deformity superior endplate L3 greater on the left. Very mild L2-3 disc space narrowing. No pars defect. Vascular calcifications. IMPRESSION: Scoliosis lumbar spine convex right. Small Schmorl's node deformity superior endplate L3 greater on the left. Very mild L2-3 disc space narrowing. Aortic atherosclerosis. Electronically Signed   By: Genia Del M.D.   On: 11/16/2016 19:17   Ct Soft Tissue Neck W Contrast  Result Date: 11/08/2016 CLINICAL DATA:  Epiglottic mass found at endoscopy. Asymptomatic. Smoking history. EXAM: CT NECK WITH CONTRAST TECHNIQUE: Multidetector CT imaging  of the neck was performed using the standard protocol following the bolus administration of intravenous contrast. CONTRAST:  108mL ISOVUE-300 IOPAMIDOL (ISOVUE-300) INJECTION 61% COMPARISON:  09/17/2013 FINDINGS: Pharynx and larynx: No mucosal or submucosal lesion seen. Specifically, the epiglottis appears normal by CT. There appears to be voluntary closure of the glottis. Salivary glands: Parotid and submandibular glands are normal. Thyroid: Normal Lymph nodes: No enlarged or low-density nodes on either side of the neck. Vascular: Normal except for atherosclerosis at the carotid bifurcation on the left. Limited intracranial: Normal Visualized orbits: Limited, normal Mastoids and visualized paranasal sinuses: Clear Skeleton: Ordinary cervical spondylosis. Upper chest: Benign appearing pleural and parenchymal scarring at the apices. Other: None IMPRESSION: Negative CT. No abnormality of the epiglottis identified. No change since study of 2015. Electronically Signed   By: Nelson Chimes M.D.   On: 11/08/2016 13:33   Ct Hip Right Wo  Contrast  Result Date: 11/08/2016 CLINICAL DATA:  Severe right hip pain extending laterally into the leg. Hip fracture in October 2017 treated with open reduction and internal fixation. EXAM: CT OF THE RIGHT HIP WITHOUT CONTRAST TECHNIQUE: Multidetector CT imaging of the right hip was performed according to the standard protocol. Multiplanar CT image reconstructions were also generated. COMPARISON:  Radiographs dated 10/23/2016 FINDINGS: Bones/Joint/Cartilage There is slight sclerosis of the superior aspect of femoral head with several areas of lucency in the subcortical region. This could represent early arthritis or very early avascular necrosis. Two screws in the intramedullary nail are in place in good position with no evidence of loosening. Solid union of the previous intertrochanteric fracture. Calcified fragment of bone lies anteromedial to the femoral neck. This is not felt to be significant. Soft tissues Negative. IMPRESSION: 1. No acute abnormalities. 2. Slight sclerosis of the superior aspect of the femoral head. I suspect this represents early arthritic change but very early avascular necrosis could give this appearance. Electronically Signed   By: Lorriane Shire M.D.   On: 11/08/2016 16:38   ASSESSMENT & PLAN:   Problem List Items Addressed This Visit    Tobacco abuse    Risk factor for poor healing.      RA (rheumatoid arthritis) (HCC)    Likely contributing to some of her underlying poor healing.       Relevant Medications   traMADol (ULTRAM) 50 MG tablet   Adult idiopathic generalized osteoporosis    Additional risk factor for poor healing.      Closed intertrochanteric fracture of right femur ([place])    This does appear to be a potential malunion versus early AVN.  Given the lateral hip pain we will have her begin on nitroglycerin protocol/continue working on hip abduction exercises.  She is markedly weak over the glute medius.  Given the duration I would like to see how she  responds to conservative measures but if any lack of improvement will plan for [facility] ultrasound at revaluation for potential bursal formation versus consideration to Dr. Zollie Beckers for potential conversion to total hip arthroplasty.        Low back pain - Primary    Consider potential radicular component however x-rays are reassuring that no significant spondylosis.      Relevant Medications   traMADol (ULTRAM) 50 MG tablet   gabapentin (NEURONTIN) 100 MG capsule   nitroGLYCERIN (NITRODUR - DOSED IN MG/24 HR) 0.2 mg/hr patch   Other Relevant Orders   DG Lumbar Spine Complete (Completed)      Follow-up: Return in about 4 weeks (  around 12/14/2016).   CMA/ATC served as Education administrator during this visit. History, Physical, and Plan performed by medical provider. Documentation and orders reviewed and attested to.      Teresa Coombs, [facility] Physician

## 2016-11-16 NOTE — Patient Instructions (Addendum)
He was nice to meet you. Nitroglycerin Protocol   Apply 1/4 nitroglycerin patch to affected area daily.  Change position of patch within the affected area every 24 hours.  You may experience a headache during the first 1-2 weeks of using the patch, these should subside.  If you experience headaches after beginning nitroglycerin patch treatment, you may take your preferred over the counter pain reliever.  Another side effect of the nitroglycerin patch is skin irritation or rash related to patch adhesive.  Please notify our office if you develop more severe headaches or rash, and stop the patch.  Tendon healing with nitroglycerin patch may require 12 to 24 weeks depending on the extent of injury.  Men should not use if taking Viagra, Cialis, or Levitra.   Do not use if you have migraines or rosacea.

## 2016-11-22 ENCOUNTER — Telehealth: Payer: Self-pay | Admitting: Sports Medicine

## 2016-11-22 NOTE — Telephone Encounter (Signed)
Patient called to advise that the nitroglycerin patches will not stick to her skin. She is asking for alternative. Verified home #

## 2016-11-22 NOTE — Telephone Encounter (Signed)
Forwarding to Dr. Rigby.  

## 2016-11-23 NOTE — Telephone Encounter (Signed)
Ensure that she his removing the adhesive packing and have her try scrubbing the area with an alcohol swab prior to application.

## 2016-11-23 NOTE — Telephone Encounter (Signed)
Called [phone] and left VM for pt to call the office.

## 2016-11-23 NOTE — Telephone Encounter (Signed)
Pt is aware of annotations. I did advise her that she may place a band aid over the patch to help it stay in place. She was agreeable.

## 2016-11-24 ENCOUNTER — Other Ambulatory Visit: Payer: Self-pay | Admitting: Nurse Practitioner

## 2016-11-24 DIAGNOSIS — R413 Other amnesia: Secondary | ICD-10-CM

## 2016-11-24 DIAGNOSIS — D369 Benign neoplasm, unspecified site: Secondary | ICD-10-CM | POA: Insufficient documentation

## 2016-11-24 DIAGNOSIS — H269 Unspecified cataract: Secondary | ICD-10-CM | POA: Insufficient documentation

## 2016-11-24 DIAGNOSIS — M545 Low back pain, unspecified: Secondary | ICD-10-CM | POA: Insufficient documentation

## 2016-11-24 NOTE — Assessment & Plan Note (Signed)
This does appear to be a potential malunion versus early AVN.  Given the lateral hip pain we will have her begin on nitroglycerin protocol/continue working on hip abduction exercises.  She is markedly weak over the glute medius.  Given the duration I would like to see how she responds to conservative measures but if any lack of improvement will plan for MS[facility] ultrasound at revaluation for potential bursal formation versus consideration to Dr. Zollie Beckers for potential conversion to total hip arthroplasty.

## 2016-11-24 NOTE — Assessment & Plan Note (Signed)
Additional risk factor for poor healing.

## 2016-11-24 NOTE — Assessment & Plan Note (Signed)
Consider potential radicular component however x-rays are reassuring that no significant spondylosis.

## 2016-11-24 NOTE — Assessment & Plan Note (Signed)
Likely contributing to some of her underlying poor healing.

## 2016-11-24 NOTE — Assessment & Plan Note (Signed)
Risk factor for poor healing.

## 2016-12-07 ENCOUNTER — Ambulatory Visit: Payer: Medicare Other

## 2016-12-09 ENCOUNTER — Ambulatory Visit: Payer: Medicare Other | Admitting: Family Medicine

## 2016-12-14 ENCOUNTER — Ambulatory Visit (INDEPENDENT_AMBULATORY_CARE_PROVIDER_SITE_OTHER): Payer: Medicare Other | Admitting: Family Medicine

## 2016-12-14 ENCOUNTER — Encounter: Payer: Self-pay | Admitting: Family Medicine

## 2016-12-14 VITALS — BP 118/80 | HR 88 | Ht 64.0 in | Wt 96.6 lb

## 2016-12-14 DIAGNOSIS — S72141A Displaced intertrochanteric fracture of right femur, initial encounter for closed fracture: Secondary | ICD-10-CM

## 2016-12-14 MED ORDER — VITAMIN D (ERGOCALCIFEROL) 1.25 MG (50000 UNIT) PO CAPS: 50000.0000 [IU] | ORAL_CAPSULE | ORAL | 0 refills | Status: DC

## 2016-12-14 NOTE — Patient Instructions (Signed)
Good to see you  Ice 20 minutes 2 times daily. Usually after activity and before bed. PT will be calling you and lets see how we do  Once weekly vitamin D for 12 weeks.  Can continue the nitro if you want  Gabapentin still at night USe walker until it gets better See me again in 4 weeks Please look into Dr. Rush Farmer or Dr. Mayer Camel in case we do not improve.

## 2016-12-14 NOTE — Assessment & Plan Note (Signed)
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 We discussed with patient at great length. Patient would like to try conservative therapy including formal physical therapy, once weekly vitamin D, icing regimen, protein supplementations. Patient will try this. Worsening symptoms are decreasing range of motion at follow-up I do feel that patient should be seen for potential revision.

## 2016-12-14 NOTE — Progress Notes (Signed)
[name] [address] [place], [place] [address] Phone: [phone] Subjective:     CC: hip pain   [id number]  [name] is a 76 y.o. female coming in with complaint of right hip pain. She broke her hip in October 2017 and suffered a right glute strain 6 weeks ago. She is unsure of the mechanism of injury. She has pain with movement but states that her leg feels fine with rest. Her pain has also improved with ice. She has tried IBU and that has also helped. Patient does feel some improvement after taking Gabapentin and using nitroglycerin patches as prescribed by another physician. Patient ambulates today using a walker and has the goal of being able to walk without the walker. Patient states that there is no possibility for her to be ambulating with the  Aid to the walker. Patient states that if anything she feels that she is becoming more reliant on the walker. Some mild weakness on the sign.       Patient did have a CT scan of the right hip that was inability visualized by me showing name subcortical lucency of the head of the femur that is likely early avascular necrosis.  Past Medical History:  Diagnosis Date  . Abdominal pain   . Anxiety   . Arthritis, rheumatoid ([place])   . Breast pain   . Chest discomfort   . Chest pain   . Chronic pain syndrome   . Collagen vascular disease (HCC)    hx rheumatoid arthritis.   . Constipation   . COPD (chronic obstructive pulmonary disease) ([place])   . Diverticulosis   . Fibrocystic breast disease   . Gastritis   . GERD (gastroesophageal reflux disease)   . Hemorrhoids   . Hx of colonic polyps   . Hypothyroidism   . Osteopenia   . Renal artery stenosis ([place])   . Right ankle pain   . Thyroid binding globulin deficiency   . Tobacco abuse    Past Surgical History:  Procedure Laterality Date  . CHOLECYSTECTOMY    . JOINT REPLACEMENT     right hip  . LEG SURGERY    . TONSILLECTOMY      Social History   Social History  . Marital status: Single    Spouse name: N/A  . Number of children: N/A  . Years of education: N/A   Social History Main Topics  . Smoking status: Current Every Day Smoker    Packs/day: 1.00    Types: Cigarettes  . Smokeless tobacco: Never Used  . Alcohol use No  . Drug use: No  . Sexual activity: Not Asked   Other Topics Concern  . None   Social History Narrative  . None   Allergies  Allergen Reactions  . Etrafon [Perphenazine-Amitriptyline]   . Hydrocodone Nausea And Vomiting  . Mirtazapine     Other reaction(s): Hallucination  . Doxycycline Nausea Only  . Percocet [Oxycodone-Acetaminophen] Nausea And Vomiting  . Sertraline Palpitations   Family History  Problem Relation Age of Onset  . Heart disease Sister   . Hypertension Brother   . Heart attack Brother   . Hyperlipidemia Brother   . Breast cancer Neg Hx     Past medical history, social, surgical and family history all reviewed in electronic medical record.  No pertanent information unless stated regarding to the chief complaint.   Review of Systems:Review of systems updated and as accurate as of 12/14/16  No headache, visual changes, nausea, vomiting, diarrhea, constipation, dizziness, abdominal pain, skin rash, fevers, chills, night sweats, weight loss, swollen lymph nodes,  joint swelling, chest pain, shortness of breath, mood changes. Positive muscle aches, body aches  Objective  Blood pressure 118/80, pulse 88, height 5\' 4"  (1.626 m), weight 96 lb 9.6 oz (43.8 kg). Systems examined below as of 12/14/16   General: No apparent distress alert and oriented x3 mood and affect normal, dressed appropriately.  HEENT: Pupils equal, extraocular movements intact  Respiratory: Patient's speak in full sentences and does not appear short of breath  Cardiovascular: No lower extremity edema, non tender, no erythema  Skin: Warm dry intact with no signs of infection or rash on  extremities or on axial skeleton.  Abdomen: Soft nontender  Neuro: Cranial nerves II through XII are intact, neurovascularly intact in all extremities with 2+ DTRs and 2+ pulses.  Lymph: No lymphadenopathy of posterior or anterior cervical chain or axillae bilaterally.  Gait Antalgic gait walking with the aid of a walker MSK:  Non tender with full range of motion and good stability and symmetric strength and tone of shoulders, elbows, wrist,  knee and ankles bilaterally. Severe arthritic changes of multiple joints and does have atrophy of the leg muscles. Right hip exam shows the patient does have a decrease in range of motion lacking the last 5 of external rotation and lacks last 10 of internal rotation. Mild crepitus noted. Severely tender to palpation over the posterior joint capsule. Not as much over the gluteal tendon.   Impression and Recommendations:     This case required medical decision making of moderate complexity.      Note: This dictation was prepared with Dragon dictation along with smaller phrase technology. Any transcriptional errors that result from this process are unintentional.

## 2016-12-21 ENCOUNTER — Ambulatory Visit
Admission: RE | Admit: 2016-12-21 | Discharge: 2016-12-21 | Disposition: A | Discharge status: Home / Self Care | Payer: Medicare Other | Source: Ambulatory Visit | Attending: Nurse Practitioner | Admitting: Nurse Practitioner

## 2016-12-21 DIAGNOSIS — R413 Other amnesia: Secondary | ICD-10-CM | POA: Diagnosis not present

## 2016-12-21 DIAGNOSIS — I6782 Cerebral ischemia: Secondary | ICD-10-CM | POA: Diagnosis not present

## 2016-12-21 LAB — POCT I-STAT CREATININE: Creatinine, Ser: 0.9 mg/dL (ref 0.44–1.00)

## 2016-12-21 MED ORDER — GADOBENATE DIMEGLUMINE 529 MG/ML IV SOLN
9.0000 mL | Freq: Once | INTRAVENOUS | Status: AC | PRN
  Administered 2016-12-21: 9 mL via INTRAVENOUS

## 2016-12-24 ENCOUNTER — Telehealth: Payer: Self-pay | Admitting: Family Medicine

## 2016-12-24 NOTE — Telephone Encounter (Signed)
Pt called requesting a notes from her last visit. Can these be mailed to her?

## 2016-12-27 NOTE — Telephone Encounter (Signed)
LM letting pt know °

## 2016-12-27 NOTE — Telephone Encounter (Signed)
Per protocol pt will have to come by & fill out a medical release form.

## 2017-01-01 ENCOUNTER — Other Ambulatory Visit: Payer: Self-pay | Admitting: Sports Medicine

## 2017-01-01 DIAGNOSIS — M545 Low back pain: Secondary | ICD-10-CM

## 2017-01-10 ENCOUNTER — Ambulatory Visit (INDEPENDENT_AMBULATORY_CARE_PROVIDER_SITE_OTHER): Payer: Medicare Other | Admitting: Family Medicine

## 2017-01-10 ENCOUNTER — Ambulatory Visit: Payer: Medicare Other | Admitting: Family Medicine

## 2017-01-10 ENCOUNTER — Encounter: Payer: Self-pay | Admitting: Family Medicine

## 2017-01-10 DIAGNOSIS — S72141G Displaced intertrochanteric fracture of right femur, subsequent encounter for closed fracture with delayed healing: Secondary | ICD-10-CM

## 2017-01-10 NOTE — Progress Notes (Signed)
[name] [address], [place] [address] Phone: [phone] Subjective:     CC: hip pain f/u  [id number]  [name] is a 76 y.o. female coming in with complaint of right hip pain. She broke her hip in October 2017 and suffered a right glute strain 6 weeks ago.Patient was seen by me and patient did have a CT scan of the right hip showing possible early avascular necrosis. Patient is making some very small progress. Was unable to start physical therapy until tomorrow. Patient states that she is not worse overall but has noticed that such things as getting dress is becoming little bit easier.    Previous imaging: Patient did have a CT scan of the right hip that was inability visualized by me showing name subcortical lucency of the head of the femur that is likely early avascular necrosis.  Past Medical History:  Diagnosis Date  . Abdominal pain   . Anxiety   . Arthritis, rheumatoid ([place])   . Breast pain   . Chest discomfort   . Chest pain   . Chronic pain syndrome   . Collagen vascular disease (HCC)    hx rheumatoid arthritis.   . Constipation   . COPD (chronic obstructive pulmonary disease) ([place])   . Diverticulosis   . Fibrocystic breast disease   . Gastritis   . GERD (gastroesophageal reflux disease)   . Hemorrhoids   . Hx of colonic polyps   . Hypothyroidism   . Osteopenia   . Renal artery stenosis ([place])   . Right ankle pain   . Thyroid binding globulin deficiency   . Tobacco abuse    Past Surgical History:  Procedure Laterality Date  . CHOLECYSTECTOMY    . JOINT REPLACEMENT     right hip  . LEG SURGERY    . TONSILLECTOMY     Social History   Social History  . Marital status: Single    Spouse name: N/A  . Number of children: N/A  . Years of education: N/A   Social History Main Topics  . Smoking status: Current Every Day Smoker    Packs/day: 1.00    Types: Cigarettes  . Smokeless tobacco: Never Used  .  Alcohol use No  . Drug use: No  . Sexual activity: Not Asked   Other Topics Concern  . None   Social History Narrative  . None   Allergies  Allergen Reactions  . Etrafon [Perphenazine-Amitriptyline]   . Hydrocodone Nausea And Vomiting  . Mirtazapine     Other reaction(s): Hallucination  . Doxycycline Nausea Only  . Percocet [Oxycodone-Acetaminophen] Nausea And Vomiting  . Sertraline Palpitations   Family History  Problem Relation Age of Onset  . Heart disease Sister   . Hypertension Brother   . Heart attack Brother   . Hyperlipidemia Brother   . Breast cancer Neg Hx     Past medical history, social, surgical and family history all reviewed in electronic medical record.  No pertanent information unless stated regarding to the chief complaint.   Review of Systems: No headache, visual changes, nausea, vomiting, diarrhea, constipation, dizziness, abdominal pain, skin rash, fevers, chills, night sweats, weight loss, swollen lymph nodes, body aches, joint swelling, chest pain, shortness of breath, mood changes.  Positive muscle soreness  Objective  Blood pressure 122/82, pulse 80, weight 97 lb (44 kg).   Systems examined below as of 01/10/17 General: NAD A&O x3 mood, affect normal Take Actiq HEENT:  Pupils equal, extraocular movements intact no nystagmus Respiratory: not short of breath at rest or with speaking Cardiovascular: No lower extremity edema, non tender Skin: Warm dry intact with no signs of infection or rash on extremities or on axial skeleton. Abdomen: Soft nontender, no masses Neuro: Cranial nerves  intact, neurovascularly intact in all extremities with 2+ DTRs and 2+ pulses. Lymph: No lymphadenopathy appreciated today  Gait Antalgic walking with a nadolol walker MSK: Mild tender with full range of motion and good stability and symmetric strength and tone of shoulders, elbows, wrist,  knee and ankles bilaterally.  Arthritic changes of multiple joints Right hip  exam shows the patient does have a decrease in range of motion lacking the last 5 of external rotation and lacks last 10 of internal rotation. Mild crepitus noted. Severely tender to palpation over the posterior joint capsule. Not as much over the gluteal tendon.   Impression and Recommendations:     This case required medical decision making of moderate complexity.      Note: This dictation was prepared with Dragon dictation along with smaller phrase technology. Any transcriptional errors that result from this process are unintentional.

## 2017-01-10 NOTE — Assessment & Plan Note (Signed)
Patient continues to have healing. I do think the patient is getting early avascular necrosis. We discussed [facility] treatment options. Patient and will continue with the vitamin D and restart formal physical therapy. We discussed icing regimen again. Patient will follow-up with me again in 4 weeks for further evaluation. Patient knows that this can take a total of 12-16 weeks to heal and there is a possibility that patient may need a hip replacement in the long run

## 2017-01-10 NOTE — Patient Instructions (Signed)
Good to see you  I think PT will be great  Continue the vitamin D and the gabapentin  I do think you are improving but will be slow See me again in 4 weeks.

## 2017-01-11 ENCOUNTER — Ambulatory Visit: Payer: Medicare Other | Admitting: Family Medicine

## 2017-01-19 ENCOUNTER — Ambulatory Visit: Payer: Medicare Other | Admitting: Family Medicine

## 2017-02-07 NOTE — Progress Notes (Signed)
[name] [place] [place], [place] [address] Phone: [phone] Subjective:     CC: hip pain f/u  [id number]  [name] is a 76 y.o. female coming in with complaint of right hip pain. She broke her hip in October 2017 and suffered a right glute strain 6 weeks ago.Patient was seen by me and patient did have a CT scan of the right hip showing possible early avascular necrosis. Patient was making progress with time and was to start formal physical therapy. Patient was making progress. Patient is having less pain overall. Patient still states of weakness. Feels like she is cannot gain any strength. Still seems to fatigue with standing for long amount of time.    Previous imaging: Patient did have a CT scan of the right hip that was inability visualized by me showing name subcortical lucency of the head of the femur that is likely early avascular necrosis.  Past Medical History:  Diagnosis Date  . Abdominal pain   . Anxiety   . Arthritis, rheumatoid ([place])   . Breast pain   . Chest discomfort   . Chest pain   . Chronic pain syndrome   . Collagen vascular disease (HCC)    hx rheumatoid arthritis.   . Constipation   . COPD (chronic obstructive pulmonary disease) ([place])   . Diverticulosis   . Fibrocystic breast disease   . Gastritis   . GERD (gastroesophageal reflux disease)   . Hemorrhoids   . Hx of colonic polyps   . Hypothyroidism   . Osteopenia   . Renal artery stenosis ([place])   . Right ankle pain   . Thyroid binding globulin deficiency   . Tobacco abuse    Past Surgical History:  Procedure Laterality Date  . CHOLECYSTECTOMY    . JOINT REPLACEMENT     right hip  . LEG SURGERY    . TONSILLECTOMY     Social History   Social History  . Marital status: Single    Spouse name: N/A  . Number of children: N/A  . Years of education: N/A   Social History Main Topics  . Smoking status: Current Every Day Smoker    Packs/day:  1.00    Types: Cigarettes  . Smokeless tobacco: Never Used  . Alcohol use No  . Drug use: No  . Sexual activity: Not Asked   Other Topics Concern  . None   Social History Narrative  . None   Allergies  Allergen Reactions  . Etrafon [Perphenazine-Amitriptyline]   . Hydrocodone Nausea And Vomiting  . Mirtazapine     Other reaction(s): Hallucination  . Doxycycline Nausea Only  . Percocet [Oxycodone-Acetaminophen] Nausea And Vomiting  . Sertraline Palpitations   Family History  Problem Relation Age of Onset  . Heart disease Sister   . Hypertension Brother   . Heart attack Brother   . Hyperlipidemia Brother   . Breast cancer Neg Hx     Past medical history, social, surgical and family history all reviewed in electronic medical record.  No pertanent information unless stated regarding to the chief complaint.   Review of Systems: No headache, visual changes, nausea, vomiting, diarrhea, constipation, dizziness, abdominal pain, skin rash, fevers, chills, night sweats, weight loss, swollen lymph nodes, body aches, joint swelling,  chest pain, shortness of breath, mood changes.  Positive muscle aches  Objective  Blood pressure 104/74, pulse 82, weight 94 lb (42.6 kg).   Systems examined below as of  02/08/17 General: NAD A&O x3 mood, affect normal underweight  HEENT: Pupils equal, extraocular movements intact no nystagmus Respiratory: not short of breath at rest or with speaking Cardiovascular: No lower extremity edema, non tender Skin: Warm dry intact with no signs of infection or rash on extremities or on axial skeleton. Abdomen: Soft nontender, no masses Neuro: Cranial nerves  intact, neurovascularly intact in all extremities with 2+ DTRs and 2+ pulses. Lymph: No lymphadenopathy appreciated today  Gait normal with good balance and coordination.  MSK: Non tender with full range of motion and good stability and symmetric strength and tone of shoulders, elbows, wrist,  knee hips  and ankles bilaterally.  .  Arthritic changes of multiple joints Right hip exam shows the patient does have a decrease in range of motion lacking the last 10 of external rotation. Mild tighter than previous exam. Patient continues to have some mild limitation with internal rotation as well. Less pain. Still some weakness compared to contralateral sign 4 out of 5 strength negative straight leg test.   Impression and Recommendations:     This case required medical decision making of moderate complexity.      Note: This dictation was prepared with Dragon dictation along with smaller phrase technology. Any transcriptional errors that result from this process are unintentional.

## 2017-02-08 ENCOUNTER — Encounter: Payer: Self-pay | Admitting: Family Medicine

## 2017-02-08 ENCOUNTER — Ambulatory Visit (INDEPENDENT_AMBULATORY_CARE_PROVIDER_SITE_OTHER): Payer: Medicare Other | Admitting: Family Medicine

## 2017-02-08 DIAGNOSIS — S72141G Displaced intertrochanteric fracture of right femur, subsequent encounter for closed fracture with delayed healing: Secondary | ICD-10-CM

## 2017-02-08 NOTE — Patient Instructions (Addendum)
Good to see you  You are making progress.  Ice is your friend.  Protein, protein, protein  Look into protein powder Chia seeds with your cereal can help  OK to do physical therapy as much as you want See me again in 6 weeks

## 2017-02-08 NOTE — Assessment & Plan Note (Signed)
Spent  25 minutes with patient face-to-face and had greater than 50% of counseling including as described in assessment and plan.Continues to have significant amount of discomfort and pain. I do believe that the external range of motion will not improve. We discussed with patient being underweight we do feel that there is some protein malnutrition is likely contributing to patient not healing completely. We discussed with patient about icing regimen, home exercises. We discussed which activities to do in which ones to avoid. Patient will increase activity as tolerated still. Discussed icing and to be possible in the hip replacement with patient is unwilling to discuss at this time.

## 2017-02-23 ENCOUNTER — Other Ambulatory Visit: Payer: Self-pay | Admitting: Family Medicine

## 2017-02-23 NOTE — Telephone Encounter (Signed)
Refill done.  

## 2017-03-24 ENCOUNTER — Encounter: Payer: Self-pay | Admitting: Family Medicine

## 2017-03-24 ENCOUNTER — Ambulatory Visit (INDEPENDENT_AMBULATORY_CARE_PROVIDER_SITE_OTHER): Payer: Medicare Other | Admitting: Family Medicine

## 2017-03-24 VITALS — BP 134/74 | HR 77 | Ht 64.0 in | Wt 97.0 lb

## 2017-03-24 DIAGNOSIS — S72141G Displaced intertrochanteric fracture of right femur, subsequent encounter for closed fracture with delayed healing: Secondary | ICD-10-CM | POA: Diagnosis not present

## 2017-03-24 DIAGNOSIS — M25551 Pain in right hip: Secondary | ICD-10-CM

## 2017-03-24 NOTE — Assessment & Plan Note (Signed)
Spent  25 minutes with patient face-to-face and had greater than 50% of counseling including as described in assessment and plan. No improvement. Possibly worsening. Has done formal physical therapy and has not been significant improvement. Patient is likely having more of the avascular necrosis as well. I do believe that at this point patient is consider of the possibility of surgical intervention. Patient also given the choice of potential injections for symptomatic improvement which patient declined. Encourage her though that she will need to continue the once weekly vitamin D and the bisphosphonate treatment. Patient is in agreement with plan and will be referred today.

## 2017-03-24 NOTE — Patient Instructions (Signed)
Good to see you  Vitamin D continue  Ice is your friend  Dr Mayer Camel office will call you and discuss options See me again when you need me but call me if you have questions

## 2017-03-24 NOTE — Progress Notes (Signed)
[name] [place],  [address] Phone: [phone] Subjective:    CC: Right hip pain follow-up  [id number]  [name] is a 76 y.o. female coming in with complaint of right hip pain. Patient did have a CT scan of the right hip showing a possible early avascular necrosis. This was independently visualized by me. He should last exam restarted with formal physical therapy and was making some progress. Asian elected to continue with conservative therapy otherwise. Patient states Unfortunate she has not made any significant improvement. Worsening symptoms may be even. Feels like she is unable to get off of her walker. Patient is also missing trips with her family because of this pain in the instability noted. Has noticed some decreasing range of motion as well.      Past Medical History:  Diagnosis Date  . Abdominal pain   . Anxiety   . Arthritis, rheumatoid ([place])   . Breast pain   . Chest discomfort   . Chest pain   . Chronic pain syndrome   . Collagen vascular disease (HCC)    hx rheumatoid arthritis.   . Constipation   . COPD (chronic obstructive pulmonary disease) ([place])   . Diverticulosis   . Fibrocystic breast disease   . Gastritis   . GERD (gastroesophageal reflux disease)   . Hemorrhoids   . Hx of colonic polyps   . Hypothyroidism   . Osteopenia   . Renal artery stenosis ([place])   . Right ankle pain   . Thyroid binding globulin deficiency   . Tobacco abuse    Past Surgical History:  Procedure Laterality Date  . CHOLECYSTECTOMY    . JOINT REPLACEMENT     right hip  . LEG SURGERY    . TONSILLECTOMY     Social History   Social History  . Marital status: Single    Spouse name: N/A  . Number of children: N/A  . Years of education: N/A   Social History Main Topics  . Smoking status: Current Every Day Smoker    Packs/day: 1.00    Types: Cigarettes  . Smokeless tobacco: Never Used  . Alcohol use No  .  Drug use: No  . Sexual activity: Not Asked   Other Topics Concern  . None   Social History Narrative  . None   Allergies  Allergen Reactions  . Etrafon [Perphenazine-Amitriptyline]   . Hydrocodone Nausea And Vomiting  . Mirtazapine     Other reaction(s): Hallucination  . Doxycycline Nausea Only  . Percocet [Oxycodone-Acetaminophen] Nausea And Vomiting  . Sertraline Palpitations   Family History  Problem Relation Age of Onset  . Heart disease Sister   . Hypertension Brother   . Heart attack Brother   . Hyperlipidemia Brother   . Breast cancer Neg Hx      Past medical history, social, surgical and family history all reviewed in electronic medical record.  No pertanent information unless stated regarding to the chief complaint.   Review of Systems:Review of systems updated and as accurate as of 03/24/17  No headache, visual changes, nausea, vomiting, diarrhea, constipation, dizziness, abdominal pain, skin rash, fevers, chills, night sweats, weight loss, swollen lymph nodes, body aches, joint swelling,  chest pain, shortness of breath, mood changes. Positive muscle aches  Objective  Blood pressure 134/74, pulse 77, height 5\' 4"  (1.626 m), weight 97 lb (44 kg), SpO2 93 %. Systems examined below as of 03/24/17   General: No  apparent distress alert and oriented x3 mood and affect normal, dressed appropriately.  HEENT: Pupils equal, extraocular movements intact  Respiratory: Patient's speak in full sentences and does not appear short of breath  Cardiovascular: No lower extremity edema, non tender, no erythema  Skin: Warm dry intact with no signs of infection or rash on extremities or on axial skeleton.  Abdomen: Soft nontender  Neuro: Cranial nerves II through XII are intact, neurovascularly intact in all extremities with 2+ DTRs and 2+ pulses.  Lymph: No lymphadenopathy of posterior or anterior cervical chain or axillae bilaterally.  Gait Ambulating with the aid of a  walker MSK:  Non tender with full range of motion and good stability and symmetric strength and tone of shoulders, elbows, wrist, knee and ankles bilaterally. Significant arthritic changes of multiple joints Right hip exam shows the patient has significant decrease in internal range of motion at this time. Worsen previous. Significant crepitus noted. Mild increase in weakness with 4 out of 5 strength compared to contralateral side. Mild increase in muscle atrophy.   Impression and Recommendations:     This case required medical decision making of moderate complexity.      Note: This dictation was prepared with Dragon dictation along with smaller phrase technology. Any transcriptional errors that result from this process are unintentional.

## 2017-04-13 ENCOUNTER — Other Ambulatory Visit (HOSPITAL_COMMUNITY): Payer: Self-pay | Admitting: Orthopedic Surgery

## 2017-04-13 DIAGNOSIS — M25551 Pain in right hip: Secondary | ICD-10-CM

## 2017-05-04 ENCOUNTER — Encounter
Admission: RE | Admit: 2017-05-04 | Discharge: 2017-05-04 | Disposition: A | Discharge status: Home / Self Care | Payer: Medicare Other | Source: Ambulatory Visit | Attending: Orthopedic Surgery | Admitting: Orthopedic Surgery

## 2017-05-04 ENCOUNTER — Ambulatory Visit
Admission: RE | Admit: 2017-05-04 | Discharge: 2017-05-04 | Disposition: A | Discharge status: Home / Self Care | Payer: Medicare Other | Source: Ambulatory Visit | Attending: Orthopedic Surgery | Admitting: Orthopedic Surgery

## 2017-05-04 DIAGNOSIS — M25551 Pain in right hip: Secondary | ICD-10-CM | POA: Insufficient documentation

## 2017-05-04 MED ORDER — TECHNETIUM TC 99M MEDRONATE IV KIT
25.0000 | PACK | Freq: Once | INTRAVENOUS | Status: AC | PRN
  Administered 2017-05-04: 23.64 via INTRAVENOUS

## 2017-06-01 ENCOUNTER — Other Ambulatory Visit: Payer: Self-pay | Admitting: Family Medicine

## 2017-06-02 NOTE — Telephone Encounter (Signed)
Refill done.  

## 2017-08-29 ENCOUNTER — Other Ambulatory Visit: Payer: Self-pay | Admitting: Orthopedic Surgery

## 2017-08-29 DIAGNOSIS — M5416 Radiculopathy, lumbar region: Secondary | ICD-10-CM

## 2017-08-30 ENCOUNTER — Other Ambulatory Visit: Payer: Self-pay | Admitting: Internal Medicine

## 2017-08-30 DIAGNOSIS — Z1231 Encounter for screening mammogram for malignant neoplasm of breast: Secondary | ICD-10-CM

## 2017-09-05 ENCOUNTER — Other Ambulatory Visit: Payer: Self-pay | Admitting: Family Medicine

## 2017-09-14 ENCOUNTER — Ambulatory Visit
Admission: RE | Admit: 2017-09-14 | Discharge: 2017-09-14 | Disposition: A | Discharge status: Home / Self Care | Payer: Medicare Other | Source: Ambulatory Visit | Attending: Orthopedic Surgery | Admitting: Orthopedic Surgery

## 2017-09-14 DIAGNOSIS — M47896 Other spondylosis, lumbar region: Secondary | ICD-10-CM | POA: Diagnosis not present

## 2017-09-14 DIAGNOSIS — M5416 Radiculopathy, lumbar region: Secondary | ICD-10-CM

## 2017-09-16 ENCOUNTER — Ambulatory Visit
Admission: RE | Admit: 2017-09-16 | Discharge: 2017-09-16 | Disposition: A | Discharge status: Home / Self Care | Payer: Medicare Other | Source: Ambulatory Visit | Attending: Internal Medicine | Admitting: Internal Medicine

## 2017-09-16 DIAGNOSIS — Z1231 Encounter for screening mammogram for malignant neoplasm of breast: Secondary | ICD-10-CM

## 2017-10-20 ENCOUNTER — Ambulatory Visit: Payer: Medicare Other | Admitting: Internal Medicine

## 2017-11-21 ENCOUNTER — Encounter: Payer: Self-pay | Admitting: Internal Medicine

## 2017-11-21 ENCOUNTER — Ambulatory Visit (INDEPENDENT_AMBULATORY_CARE_PROVIDER_SITE_OTHER): Payer: Medicare Other | Admitting: Internal Medicine

## 2017-11-21 VITALS — Ht 64.0 in | Wt 97.0 lb

## 2017-11-21 DIAGNOSIS — J4520 Mild intermittent asthma, uncomplicated: Secondary | ICD-10-CM

## 2017-11-21 DIAGNOSIS — F17210 Nicotine dependence, cigarettes, uncomplicated: Secondary | ICD-10-CM

## 2017-11-21 DIAGNOSIS — R918 Other nonspecific abnormal finding of lung field: Secondary | ICD-10-CM | POA: Diagnosis not present

## 2017-11-21 DIAGNOSIS — Z720 Tobacco use: Secondary | ICD-10-CM

## 2017-11-21 MED ORDER — ALBUTEROL SULFATE HFA 108 (90 BASE) MCG/ACT IN AERS: 2.0000 | INHALATION_SPRAY | Freq: Four times a day (QID) | RESPIRATORY_TRACT | 2 refills | Status: DC | PRN

## 2017-11-21 NOTE — Progress Notes (Signed)
[facility] Consultation      Date: 11/21/2017,   MRN# [id number] [name] 1940/10/07      AdmissionWeight: 97 lb (44 kg)                 CurrentWeight: 97 lb (44 kg) [name] is a 77 y.o. old female seen in consultation for abnormal CXR at the request of Dr. Sabra Heck     CHIEF COMPLAINT:   Follow up abnormal CT scan   HISTORY OF PRESENT ILLNESS   77 yo pleasant white female with long standing smoking history current 1 ppd for 50 years Still smokes  Patient has intermittent wheezing and DOE  No signs of infection at this time No signs of CHF   Previous CT chest in 2016 shows very small LUL nodule Current CT chest shows stable LUL nodule for approx 2 years Will need repeat CT chest since she has continued to smoke  Will need albuterol inh     Smoking Assessment and Cessation Counseling   Upon further questioning, Patient smokes 1 PPD  I have advised patient to quit/stop smoking as soon as possible due to high risk for multiple medical problems  Patient   is NOT willing to quit smoking  I have advised patient that we can assist and have options of Nicotine replacement therapy. I also advised patient on behavioral therapy and can provide oral medication therapy in conjunction with the other therapies  Follow up next Office visit  for assessment of smoking cessation  Smoking cessation counseling advised for 6 minutes    Current Medication:  Current Outpatient Medications:  .  clonazePAM (KLONOPIN) 0.5 MG tablet, Take 0.5 mg by mouth 2 (two) times daily., Disp: , Rfl:  .  DENOSUMAB [place], Inject into the skin., Disp: , Rfl:  .  estradiol (ESTRACE) 0.1 MG/GM vaginal cream, Place 0.1 g vaginally 2 (two) times a week., Disp: , Rfl:  .  folic acid (FOLVITE) 1 MG tablet, Take 1 tablet by mouth daily., Disp: , Rfl:  .  hydroxychloroquine (PLAQUENIL) 200 MG tablet, TAKE ONE TABLET TWICE DAILY, Disp: , Rfl:  .  levothyroxine  (SYNTHROID, LEVOTHROID) 88 MCG tablet, Take 88 mcg by mouth daily., Disp: , Rfl:  .  Melatonin 5 MG TABS, Take 5 mg by mouth at bedtime., Disp: , Rfl:  .  Methotrexate Sodium (METHOTREXATE, PF,) 200 MG/8ML injection, ADMINISTER 0.8MLS ONCE A WEEK, Disp: , Rfl:  .  Multiple Vitamins-Minerals (MULTIVITAMIN WITH MINERALS) tablet, Take 1 tablet by mouth daily., Disp: , Rfl:  .  nitroGLYCERIN (NITRODUR - DOSED IN MG/24 HR) 0.2 mg/hr patch, Place 1/4 to 1/2 of a patch over affected region. Remove and replace once daily.  Slightly alter skin placement daily, Disp: 30 patch, Rfl: 1 .  omeprazole (PRILOSEC) 40 MG capsule, Take 1 capsule by mouth daily., Disp: , Rfl:  .  polyethylene glycol powder (GLYCOLAX/MIRALAX) powder, Dissolved 1 tablespoon in 4-8 ounces of water or juice up and drink up to 3 times daily as needed for constipation., Disp: 255 g, Rfl: 0 .  Probiotic Product (PROBIOTIC FORMULA PO), Take by mouth., Disp: , Rfl:  .  pseudoephedrine (SUDAFED) 30 MG tablet, Take 30 mg by mouth every 4 (four) hours as needed for congestion., Disp: , Rfl:  .  senna (SENOKOT) 8.6 MG tablet, Take 16 mg by mouth., Disp: , Rfl:  .  traMADol (ULTRAM) 50 MG tablet, Take 0.5 tablets by mouth 2 (two) times daily  as needed., Disp: , Rfl:  .  venlafaxine (EFFEXOR) 37.5 MG tablet, Take 37.5 mg by mouth 2 (two) times daily., Disp: , Rfl:  .  vitamin C (ASCORBIC ACID) 500 MG tablet, Take 500 mg by mouth daily., Disp: , Rfl:  .  VITAMIN D, CHOLECALCIFEROL, PO, Take 1 tablet by mouth daily., Disp: , Rfl:     ALLERGIES   Other; Sulfa antibiotics; Etrafon [perphenazine-amitriptyline]; Hydrocodone; Mirtazapine; Doxycycline; Percocet [oxycodone-acetaminophen]; and Sertraline     REVIEW OF SYSTEMS   Review of Systems  Constitutional: Negative for chills, diaphoresis, fever, malaise/fatigue and weight loss.  HENT: Negative for congestion and hearing loss.   Respiratory: Positive for shortness of breath and wheezing.  Negative for cough, hemoptysis and sputum production.   Cardiovascular: Negative for chest pain, palpitations, orthopnea and leg swelling.  Gastrointestinal: Negative for heartburn.  Musculoskeletal: Positive for joint pain. Negative for back pain, myalgias and neck pain.  Skin: Negative for rash.  Neurological: Negative for weakness.  All other systems reviewed and are negative.    Ht 5\' 4"  (1.626 m)   Wt 97 lb (44 kg)   BMI 16.65 kg/m    PHYSICAL EXAM  Physical Exam  Constitutional: She is oriented to person, place, and time. She appears well-developed and well-nourished. No distress.  HENT:  Mouth/Throat: No oropharyngeal exudate.  Neck: Neck supple.  Cardiovascular: Normal rate, regular rhythm and normal heart sounds.  No murmur heard. Pulmonary/Chest: Effort normal. No stridor. No respiratory distress. She has wheezes.  Musculoskeletal: Normal range of motion. She exhibits no edema.  Neurological: She is alert and oriented to person, place, and time. No cranial nerve deficit.  Skin: Skin is warm. She is not diaphoretic.  Psychiatric: She has a normal mood and affect.        ASSESSMENT/PLAN   77 yo  pleasant white female with long standing history of smoking with intermittent wheezing from ongoing smoking with high suspicion for COPD with previous LUL nodule that has been stable for approx 2 years. Since her ongoing smoking abuse, she will need CT chest to assess lung nodules   #1 SOB and intermittent wheezing from smoking and probable underlying COPD Start albuterol as needed  #2 LUL nodules Needs CT chest for interval changes  #3Smoking cessation strongly advised     Patient satisfied with Plan of action and management. All questions answered Follow up 3 months   Ahtziry Saathoff Patricia Pesa, M.D.  [facility]  Medical Director [place] Director [facility] Cardio-Pulmonary Department

## 2017-11-21 NOTE — Patient Instructions (Signed)
STOP SMOKING!  CT chest to assess lung nodules Start ALbuterol as needed

## 2017-11-24 ENCOUNTER — Ambulatory Visit
Admission: RE | Admit: 2017-11-24 | Discharge: 2017-11-24 | Disposition: A | Discharge status: Home / Self Care | Payer: Medicare Other | Source: Ambulatory Visit | Attending: Internal Medicine | Admitting: Internal Medicine

## 2017-11-24 DIAGNOSIS — R918 Other nonspecific abnormal finding of lung field: Secondary | ICD-10-CM | POA: Diagnosis not present

## 2017-11-24 DIAGNOSIS — I2510 Atherosclerotic heart disease of native coronary artery without angina pectoris: Secondary | ICD-10-CM | POA: Diagnosis not present

## 2017-11-24 DIAGNOSIS — I700 Atherosclerosis of aorta: Secondary | ICD-10-CM | POA: Insufficient documentation

## 2017-12-01 ENCOUNTER — Telehealth: Payer: Self-pay | Admitting: Internal Medicine

## 2017-12-01 NOTE — Telephone Encounter (Signed)
Please advise on pt's CT results. You told pt to f/u in 3 months but to go ahead and get CT.

## 2017-12-01 NOTE — Telephone Encounter (Signed)
Patient calling for results of CT Please call when available

## 2017-12-05 NOTE — Telephone Encounter (Signed)
No real concerns  Needs CT chest in 1 year

## 2017-12-05 NOTE — Telephone Encounter (Signed)
Pt informed. Nothing further needed. 

## 2018-01-17 ENCOUNTER — Ambulatory Visit
Admission: RE | Admit: 2018-01-17 | Discharge: 2018-01-17 | Disposition: A | Discharge status: Home / Self Care | Payer: Medicare Other | Source: Ambulatory Visit | Attending: Family Medicine | Admitting: Family Medicine

## 2018-01-17 ENCOUNTER — Other Ambulatory Visit: Payer: Self-pay | Admitting: Family Medicine

## 2018-01-17 DIAGNOSIS — N3090 Cystitis, unspecified without hematuria: Secondary | ICD-10-CM | POA: Insufficient documentation

## 2018-01-17 DIAGNOSIS — I700 Atherosclerosis of aorta: Secondary | ICD-10-CM | POA: Insufficient documentation

## 2018-01-17 DIAGNOSIS — R1013 Epigastric pain: Secondary | ICD-10-CM | POA: Insufficient documentation

## 2018-01-17 DIAGNOSIS — K5730 Diverticulosis of large intestine without perforation or abscess without bleeding: Secondary | ICD-10-CM | POA: Diagnosis not present

## 2018-01-17 DIAGNOSIS — R110 Nausea: Secondary | ICD-10-CM | POA: Diagnosis present

## 2018-01-17 DIAGNOSIS — R1032 Left lower quadrant pain: Secondary | ICD-10-CM

## 2018-01-17 DIAGNOSIS — M810 Age-related osteoporosis without current pathological fracture: Secondary | ICD-10-CM | POA: Diagnosis not present

## 2018-01-17 DIAGNOSIS — R1031 Right lower quadrant pain: Secondary | ICD-10-CM

## 2018-01-17 MED ORDER — IOHEXOL 300 MG/ML  SOLN
75.0000 mL | Freq: Once | INTRAMUSCULAR | Status: AC | PRN
  Administered 2018-01-17: 75 mL via INTRAVENOUS

## 2018-02-21 ENCOUNTER — Emergency Department: Payer: Medicare Other

## 2018-02-21 ENCOUNTER — Emergency Department
Admission: EM | Admit: 2018-02-21 | Discharge: 2018-02-21 | Disposition: A | Discharge status: Home / Self Care | Payer: Medicare Other | Attending: Emergency Medicine | Admitting: Emergency Medicine

## 2018-02-21 DIAGNOSIS — E039 Hypothyroidism, unspecified: Secondary | ICD-10-CM | POA: Diagnosis not present

## 2018-02-21 DIAGNOSIS — R110 Nausea: Secondary | ICD-10-CM | POA: Insufficient documentation

## 2018-02-21 DIAGNOSIS — F17210 Nicotine dependence, cigarettes, uncomplicated: Secondary | ICD-10-CM | POA: Diagnosis not present

## 2018-02-21 DIAGNOSIS — J449 Chronic obstructive pulmonary disease, unspecified: Secondary | ICD-10-CM | POA: Diagnosis not present

## 2018-02-21 DIAGNOSIS — K2900 Acute gastritis without bleeding: Secondary | ICD-10-CM | POA: Diagnosis not present

## 2018-02-21 DIAGNOSIS — Z79899 Other long term (current) drug therapy: Secondary | ICD-10-CM | POA: Insufficient documentation

## 2018-02-21 DIAGNOSIS — R1084 Generalized abdominal pain: Secondary | ICD-10-CM

## 2018-02-21 DIAGNOSIS — K2970 Gastritis, unspecified, without bleeding: Secondary | ICD-10-CM

## 2018-02-21 LAB — CBC
HCT: 36.4 % (ref 35.0–47.0)
Hemoglobin: 12.9 g/dL (ref 12.0–16.0)
MCH: 33.8 pg (ref 26.0–34.0)
MCHC: 35.5 g/dL (ref 32.0–36.0)
MCV: 95.3 fL (ref 80.0–100.0)
PLATELETS: 283 10*3/uL (ref 150–440)
RBC: 3.81 MIL/uL (ref 3.80–5.20)
RDW: 13.5 % (ref 11.5–14.5)
WBC: 8.5 10*3/uL (ref 3.6–11.0)

## 2018-02-21 LAB — URINALYSIS, COMPLETE (UACMP) WITH MICROSCOPIC
BILIRUBIN URINE: NEGATIVE
Bacteria, UA: NONE SEEN
Glucose, UA: NEGATIVE mg/dL
Hgb urine dipstick: NEGATIVE
Ketones, ur: 5 mg/dL — AB
NITRITE: NEGATIVE
PH: 5 (ref 5.0–8.0)
Protein, ur: 30 mg/dL — AB
SPECIFIC GRAVITY, URINE: 1.019 (ref 1.005–1.030)

## 2018-02-21 LAB — COMPREHENSIVE METABOLIC PANEL
ALK PHOS: 55 U/L (ref 38–126)
ALT: 18 U/L (ref 0–44)
ANION GAP: 7 (ref 5–15)
AST: 28 U/L (ref 15–41)
Albumin: 4.2 g/dL (ref 3.5–5.0)
BILIRUBIN TOTAL: 0.6 mg/dL (ref 0.3–1.2)
BUN: 22 mg/dL (ref 8–23)
CO2: 29 mmol/L (ref 22–32)
Calcium: 9.2 mg/dL (ref 8.9–10.3)
Chloride: 101 mmol/L (ref 98–111)
Creatinine, Ser: 1.05 mg/dL — ABNORMAL HIGH (ref 0.44–1.00)
GFR calc Af Amer: 58 mL/min — ABNORMAL LOW (ref >60)
GFR, EST NON AFRICAN AMERICAN: 50 mL/min — AB (ref >60)
GLUCOSE: 110 mg/dL — AB (ref 70–99)
POTASSIUM: 4 mmol/L (ref 3.5–5.1)
Sodium: 137 mmol/L (ref 135–145)
Total Protein: 6.5 g/dL (ref 6.5–8.1)

## 2018-02-21 LAB — LIPASE, BLOOD: Lipase: 33 U/L (ref 11–51)

## 2018-02-21 MED ORDER — FAMOTIDINE 20 MG PO TABS: 20.0000 mg | ORAL_TABLET | Freq: Two times a day (BID) | ORAL | 0 refills | Status: DC

## 2018-02-21 MED ORDER — ALUM & MAG HYDROXIDE-SIMETH 200-200-20 MG/5ML PO SUSP
30.0000 mL | Freq: Once | ORAL | Status: AC
  Administered 2018-02-21: 30 mL via ORAL
  Filled 2018-02-21: qty 30

## 2018-02-21 MED ORDER — ALUM & MAG HYDROXIDE-SIMETH 200-200-20 MG/5ML PO SUSP
ORAL | Status: AC
  Administered 2018-02-21: 30 mL via ORAL
  Filled 2018-02-21: qty 30

## 2018-02-21 MED ORDER — FAMOTIDINE 20 MG PO TABS
40.0000 mg | ORAL_TABLET | Freq: Once | ORAL | Status: AC
  Administered 2018-02-21: 40 mg via ORAL
  Filled 2018-02-21: qty 2

## 2018-02-21 MED ORDER — SODIUM CHLORIDE 0.9 % IV BOLUS
1000.0000 mL | Freq: Once | INTRAVENOUS | Status: AC
  Administered 2018-02-21: 1000 mL via INTRAVENOUS

## 2018-02-21 MED ORDER — IOHEXOL 350 MG/ML SOLN
75.0000 mL | Freq: Once | INTRAVENOUS | Status: AC | PRN
  Administered 2018-02-21: 75 mL via INTRAVENOUS
  Filled 2018-02-21: qty 75

## 2018-02-21 MED ORDER — ALUMINUM-MAGNESIUM-SIMETHICONE 200-200-20 MG/5ML PO SUSP: 30.0000 mL | Freq: Three times a day (TID) | ORAL | 0 refills | Status: DC

## 2018-02-21 NOTE — ED Triage Notes (Signed)
Lower abdominal pain X 1 weeks, unable to eat due to pain. Pt unable to get in touch with her gastroenterologist today. States she is "just tired of the stabbing pain when I eat".  Pt alert and oriented X4, active, cooperative, pt in NAD. RR even and unlabored, color WNL.

## 2018-02-21 NOTE — ED Notes (Signed)
Patient transported to CT 

## 2018-02-21 NOTE — Discharge Instructions (Addendum)
Your blood tests and CT scan of the abdomen were okay today, but your CT does show signs of stomach inflammation.  Take medication to reduce your stomach acid which should help with your symptoms.  Continue eating and drinking regularly.  Follow-up with gastroenterology for further evaluation of your symptoms.

## 2018-02-21 NOTE — ED Provider Notes (Signed)
[facility] Emergency Department Provider Note  ____________________________________________  Time seen: Approximately 6:02 PM  I have reviewed the triage vital signs and the nursing notes.   HISTORY  Chief Complaint Abdominal Pain    HPI [name] is a 77 y.o. female with a history of rheumatoid arthritis, COPD, constipation, diverticulosis, renal artery stenosis who complains of generalized abdominal pain for the past 3 weeks.  She reports that it is gradually worsening and now constant for the past week, worse with eating, no alleviating factors.  Nonradiating.  Moderate intensity.  Bowel movements have been regular which she attributes to her frequent laxative use.  No constipation or vomiting.  Reports inability to eat any substantial amount over the past week or 2.  Patient also notes that she currently takes Bentyl every day to help with her stomach pain, but also takes Senokot every night to help alleviate constipation.      Past Medical History:  Diagnosis Date  . Abdominal pain   . Anxiety   . Arthritis, rheumatoid ([place])   . Breast pain   . Chest discomfort   . Chest pain   . Chronic pain syndrome   . Collagen vascular disease (HCC)    hx rheumatoid arthritis.   . Constipation   . COPD (chronic obstructive pulmonary disease) ([place])   . Diverticulosis   . Fibrocystic breast disease   . Gastritis   . GERD (gastroesophageal reflux disease)   . Hemorrhoids   . Hx of colonic polyps   . Hypothyroidism   . Osteopenia   . Renal artery stenosis ([place])   . Right ankle pain   . Thyroid binding globulin deficiency   . Tobacco abuse      Patient Active Problem List   Diagnosis Date Noted  . Cataracts, bilateral 11/24/2016  . Adenomatous polyps 11/24/2016  . Low back pain 11/24/2016  . Adult idiopathic generalized osteoporosis 06/08/2016  . Vitamin B12 deficiency anemia due to intrinsic factor deficiency 04/22/2016  . Closed  intertrochanteric fracture of right femur ([place]) 03/30/2016  . History of nonmelanoma skin cancer 11/17/2015  . Compression fracture of lumbar vertebra ([place]) 05/27/2015  . Valvular heart disease 03/21/2015  . 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  . Pathological fracture due to osteoporosis 03/04/2015  . RA (rheumatoid arthritis) ([place]) 02/18/2015  . Arthritis, rheumatoid ([place])   . Thyroid binding globulin deficiency   . Breast pain   . GERD (gastroesophageal reflux disease)   . Tobacco abuse   . Chest discomfort   . Abdominal pain   . Hypothyroid   . Renal artery stenosis ([place])   . COPD (chronic obstructive pulmonary disease) ([place])   . Hx of colonic polyps   . Anxiety   . Hemorrhoids   . Gastritis   . Diverticulosis   . Fibrocystic breast disease   . Constipation   . Osteoporosis 10/10/2013  . Pernicious anemia 10/10/2013  . CHRONIC PAIN SYNDROME 08/03/2007     Past Surgical History:  Procedure Laterality Date  . CHOLECYSTECTOMY    . JOINT REPLACEMENT     right hip  . LEG SURGERY    . TONSILLECTOMY       Prior to Admission medications   Medication Sig Start Date End Date Taking? Authorizing Provider  albuterol (PROVENTIL HFA;VENTOLIN HFA) 108 (90 Base) MCG/ACT inhaler Inhale 2 puffs into the lungs every 6 (six) hours as needed for wheezing or shortness of breath. 11/21/17   Flora Lipps, MD  aluminum-magnesium  hydroxide-simethicone (MAALOX) 161-096-04 MG/5ML SUSP Take 30 mLs by mouth 4 (four) times daily -  before meals and at bedtime. 02/21/18   Carrie Mew, MD  clonazePAM (KLONOPIN) 0.5 MG tablet Take 0.5 mg by mouth 2 (two) times daily.    [provider]  DENOSUMAB [place] Inject into the skin.    [provider]  estradiol (ESTRACE) 0.1 MG/GM vaginal cream Place 0.1 g vaginally 2 (two) times a week. 08/11/16   [provider]  famotidine (PEPCID) 20 MG tablet Take 1 tablet (20 mg total) by mouth 2 (two) times daily. 02/21/18    Carrie Mew, MD  folic acid (FOLVITE) 1 MG tablet Take 1 tablet by mouth daily. 10/21/14   [provider]  hydroxychloroquine (PLAQUENIL) 200 MG tablet TAKE ONE TABLET TWICE DAILY 01/03/15   [provider]  levothyroxine (SYNTHROID, LEVOTHROID) 88 MCG tablet Take 88 mcg by mouth daily.    [provider]  Melatonin 5 MG TABS Take 5 mg by mouth at bedtime.    [provider]  Methotrexate Sodium (METHOTREXATE, PF,) 200 MG/8ML injection ADMINISTER 0.8MLS ONCE A WEEK 11/27/14   [provider]  Multiple Vitamins-Minerals (MULTIVITAMIN WITH MINERALS) tablet Take 1 tablet by mouth daily.    [provider]  nitroGLYCERIN (NITRODUR - DOSED IN MG/24 HR) 0.2 mg/hr patch Place 1/4 to 1/2 of a patch over affected region. Remove and replace once daily.  Slightly alter skin placement daily 11/16/16   Gerda Diss, DO  omeprazole (PRILOSEC) 40 MG capsule Take 1 capsule by mouth daily. 07/03/15   [provider]  polyethylene glycol powder (GLYCOLAX/MIRALAX) powder Dissolved 1 tablespoon in 4-8 ounces of water or juice up and drink up to 3 times daily as needed for constipation. 07/08/15   Joanne Gavel, MD  Probiotic Product (PROBIOTIC FORMULA PO) Take by mouth.    [provider]  pseudoephedrine (SUDAFED) 30 MG tablet Take 30 mg by mouth every 4 (four) hours as needed for congestion.    [provider]  senna (SENOKOT) 8.6 MG tablet Take 16 mg by mouth.    [provider]  traMADol (ULTRAM) 50 MG tablet Take 0.5 tablets by mouth 2 (two) times daily as needed.    [provider]  venlafaxine (EFFEXOR) 37.5 MG tablet Take 37.5 mg by mouth 2 (two) times daily.    [provider]  vitamin C (ASCORBIC ACID) 500 MG tablet Take 500 mg by mouth daily.    [provider]  VITAMIN D, CHOLECALCIFEROL, PO Take 1 tablet by mouth daily.    [provider]     Allergies Other; Sulfa  antibiotics; Etrafon [perphenazine-amitriptyline]; Hydrocodone; Mirtazapine; Doxycycline; Percocet [oxycodone-acetaminophen]; and Sertraline   Family History  Problem Relation Age of Onset  . Heart disease Sister   . Hypertension Brother   . Heart attack Brother   . Hyperlipidemia Brother   . Breast cancer Neg Hx     Social History Social History   Tobacco Use  . Smoking status: Current Every Day Smoker    Packs/day: 1.00    Types: Cigarettes  . Smokeless tobacco: Never Used  Substance Use Topics  . Alcohol use: No  . Drug use: No    Review of Systems  Constitutional:   No fever or chills.  ENT:   No sore throat. No rhinorrhea. Cardiovascular:   No chest pain or syncope. Respiratory:   No dyspnea or cough. Gastrointestinal:   Positive as above for  abdominal pain without vomiting and diarrhea.  Musculoskeletal:   Negative for focal pain or swelling All other systems reviewed and are negative except as documented above in ROS and HPI.  ____________________________________________   PHYSICAL EXAM:  VITAL SIGNS: ED Triage Vitals [02/21/18 1524]  Enc Vitals Group     BP 107/73     Pulse Rate 99     Resp 16     Temp 97.9 F (36.6 C)     Temp Source Oral     SpO2 100 %     Weight 91 lb (41.3 kg)     Height 5\' 4"  (1.626 m)     Head Circumference      Peak Flow      Pain Score 8     Pain Loc      Pain Edu?      Excl. in [place]?     Vital signs reviewed, nursing assessments reviewed.   Constitutional:   Alert and oriented. Non-toxic appearance. Eyes:   Conjunctivae are normal. EOMI. PERRL. ENT      Head:   Normocephalic and atraumatic.      Nose:   No congestion/rhinnorhea.       Mouth/Throat:   MMM, no pharyngeal erythema. No peritonsillar mass.       Neck:   No meningismus. Full ROM. Hematological/Lymphatic/Immunilogical:   No cervical lymphadenopathy. Cardiovascular:   RRR. Symmetric bilateral radial and DP pulses.  No murmurs. Cap refill less than 2  seconds. Respiratory:   Normal respiratory effort without tachypnea/retractions. Breath sounds are clear and equal bilaterally. No wheezes/rales/rhonchi. Gastrointestinal:   Soft with generalized tenderness, nonfocal. Non distended. There is no CVA tenderness.  No rebound, rigidity, or guarding. Musculoskeletal:   Normal range of motion in all extremities. No joint effusions.  No lower extremity tenderness.  No edema. Neurologic:   Normal speech and language.  Motor grossly intact. No acute focal neurologic deficits are appreciated.  Skin:    Skin is warm, dry and intact. No rash noted.  No petechiae, purpura, or bullae.  ____________________________________________    LABS (pertinent positives/negatives) (all labs ordered are listed, but only abnormal results are displayed) Labs Reviewed  COMPREHENSIVE METABOLIC PANEL - Abnormal; Notable for the following components:      Result Value   Glucose, Bld 110 (*)    Creatinine, Ser 1.05 (*)    GFR calc non Af Amer 50 (*)    GFR calc Af Amer 58 (*)    All other components within normal limits  URINALYSIS, COMPLETE (UACMP) WITH MICROSCOPIC - Abnormal; Notable for the following components:   Color, Urine YELLOW (*)    APPearance HAZY (*)    Ketones, ur 5 (*)    Protein, ur 30 (*)    Leukocytes, UA TRACE (*)    All other components within normal limits  LIPASE, BLOOD  CBC   ____________________________________________   EKG    ____________________________________________    RADIOLOGY  Ct Angio Abd/pel W And/or Wo Contrast  Result Date: 02/21/2018 CLINICAL DATA:  Lower abdominal pain for 2 weeks. Pain worse after eating or drinking. EXAM: CTA ABDOMEN AND PELVIS WITHOUT AND WITH CONTRAST TECHNIQUE: Multidetector CT imaging of the abdomen and pelvis was performed using the standard protocol during bolus administration of intravenous contrast. Multiplanar reconstructed images and MIPs were obtained and reviewed to evaluate the  vascular anatomy. CONTRAST:  79mL OMNIPAQUE IOHEXOL 350 MG/ML SOLN COMPARISON:  CT abdomen and pelvis January 17, 2018 FINDINGS: VASCULAR Aorta: Abdominal  aorta is normal course and caliber. Moderate calcific atherosclerosis. Homogeneous contrast opacification of aortoiliac vessels without dissection, aneurysm, luminal irregularity, periaortic fluid collections, or contrast extravasation. Celiac: Patent. SMA: Patent.  Mild stenosis mid SMA with less than 50% narrowing. Renals: Patent. Duplicated bilateral renal arteries, mild stenosis inferior LEFT renal artery. IMA: Patent. Inflow: Negative. Veins: Negative, not tailored for evaluation. Review of the MIP images confirms the above findings. NON-VASCULAR LOWER CHEST: HEPATOBILIARY: Status post cholecystectomy. Subcentimeter hypodensities RIGHT lobe of the liver compatible with cysts, stable from prior CT. PANCREAS: Normal. SPLEEN: Normal. ADRENALS/URINARY TRACT: Kidneys are orthotopic, demonstrating symmetric enhancement. No nephrolithiasis, hydronephrosis or solid renal masses. 1 cm cyst lower pole LEFT kidney. The unopacified ureters are normal in course and caliber. Urinary bladder is partially distended and unremarkable. Normal adrenal glands. STOMACH/BOWEL: The stomach, small and large bowel are normal in course and caliber without inflammatory changes, sensitivity decreased without oral contrast. Mild distal stomach wall thickening. Severe sigmoid colonic diverticulosis. VASCULAR/LYMPHATIC: No lymphadenopathy by CT size criteria. REPRODUCTIVE: Normal. OTHER: No intraperitoneal free fluid or free air. MUSCULOSKELETAL: Nonacute. Old sacral fractures. RIGHT femoral neck pinning. Osteopenia. Review of the MIP images confirms the above findings. IMPRESSION: VASCULAR 1. Mild stenosis proximal SMA.  Patent arteries. NON-VASCULAR 1. Mild suspected gastritis. Colonic diverticulosis without acute diverticulitis. Aortic Atherosclerosis (ICD10-I70.0). Electronically Signed    By: Elon Alas M.D.   On: 02/21/2018 18:31    ____________________________________________   PROCEDURES Procedures  ____________________________________________  DIFFERENTIAL DIAGNOSIS   Mesenteric ischemia, diverticulitis, gastritis, tumor  CLINICAL IMPRESSION / ASSESSMENT AND PLAN / ED COURSE  Pertinent labs & imaging results that were available during my care of the patient were reviewed by me and considered in my medical decision making (see chart for details).    Electronic medical record reviewed, previous CT scan of the abdomen and pelvis obtained with IV contrast about 4 to 5 weeks ago.  This was unremarkable.  However she has new and evolving symptoms since then including postprandial pain.  IV fluids for hydration.  Vital signs are normal.  Clinical Course as of Feb 21 1899  Tue Feb 21, 2018  1717 Generalized post prandial pain, concern for mesenteric ischemia. Will obtain CTA a/p. IVF for dehydration due to avoiding PO intake.    [PS]  3893 CT angiogram of the abdomen is negative except for showing some evidence of gastritis.  Less than 50% stenosis of the mid SMA, not hemodynamically significant.  Pain is suitable for discharge home on antacids.   [PS]    Clinical Course User Index [PS] Carrie Mew, MD     ____________________________________________   FINAL CLINICAL IMPRESSION(S) / ED DIAGNOSES    Final diagnoses:  Generalized abdominal pain  Gastritis without bleeding, unspecified chronicity, unspecified gastritis type  Nausea     ED Discharge Orders         Ordered    aluminum-magnesium hydroxide-simethicone (MAALOX) 200-200-20 MG/5ML SUSP  3 times daily before meals & bedtime     02/21/18 1858    famotidine (PEPCID) 20 MG tablet  2 times daily     02/21/18 1858          Portions of this note were generated with dragon dictation software. Dictation errors may occur despite best attempts at proofreading.    Carrie Mew,  MD 02/21/18 1900

## 2018-02-21 NOTE — ED Notes (Signed)
E signature not working. Pt verbalized understanding and verbalized understanding of follow up information and given prescriptions. No distress at this time and pt ready to be discharged.

## 2018-02-21 NOTE — ED Notes (Signed)
Pt c/o increased lower abd pain for the past 1-2 weeks. States she was seen by GI 2 weeks ago and told she needed to eat 6 small meals a day and drink boast shakes due to wt loss. Pt states it hurts constantly now, worse when attempts to eat or drink anything.Marland Kitchen

## 2018-02-22 ENCOUNTER — Telehealth: Payer: Self-pay | Admitting: Gastroenterology

## 2018-02-22 NOTE — Telephone Encounter (Signed)
Spoke with pt to set up ed fu with Dr. Marius Ditch she states she has a Development worker, international aid in [employer] and will Fu with her

## 2018-08-21 ENCOUNTER — Other Ambulatory Visit: Payer: Self-pay | Admitting: Physician Assistant

## 2018-08-21 ENCOUNTER — Other Ambulatory Visit: Payer: Self-pay | Admitting: Internal Medicine

## 2018-08-21 DIAGNOSIS — Z1231 Encounter for screening mammogram for malignant neoplasm of breast: Secondary | ICD-10-CM

## 2018-08-21 DIAGNOSIS — R270 Ataxia, unspecified: Secondary | ICD-10-CM

## 2018-08-21 DIAGNOSIS — R112 Nausea with vomiting, unspecified: Secondary | ICD-10-CM

## 2018-08-21 DIAGNOSIS — R10817 Generalized abdominal tenderness: Secondary | ICD-10-CM

## 2018-08-28 ENCOUNTER — Ambulatory Visit
Admission: RE | Admit: 2018-08-28 | Discharge: 2018-08-28 | Disposition: A | Discharge status: Home / Self Care | Payer: Medicare Other | Source: Ambulatory Visit | Attending: Physician Assistant | Admitting: Physician Assistant

## 2018-08-28 ENCOUNTER — Other Ambulatory Visit: Payer: Self-pay

## 2018-08-28 DIAGNOSIS — R270 Ataxia, unspecified: Secondary | ICD-10-CM | POA: Insufficient documentation

## 2018-08-28 DIAGNOSIS — R112 Nausea with vomiting, unspecified: Secondary | ICD-10-CM | POA: Diagnosis present

## 2018-08-28 DIAGNOSIS — R10817 Generalized abdominal tenderness: Secondary | ICD-10-CM | POA: Diagnosis not present

## 2018-08-28 MED ORDER — IOHEXOL 300 MG/ML  SOLN
75.0000 mL | Freq: Once | INTRAMUSCULAR | Status: AC | PRN
  Administered 2018-08-28: 75 mL via INTRAVENOUS

## 2018-11-08 ENCOUNTER — Other Ambulatory Visit: Payer: Self-pay

## 2018-11-08 ENCOUNTER — Ambulatory Visit
Admission: RE | Admit: 2018-11-08 | Discharge: 2018-11-08 | Disposition: A | Discharge status: Home / Self Care | Payer: Medicare Other | Source: Ambulatory Visit | Attending: Internal Medicine | Admitting: Internal Medicine

## 2018-11-08 DIAGNOSIS — Z1231 Encounter for screening mammogram for malignant neoplasm of breast: Secondary | ICD-10-CM | POA: Insufficient documentation

## 2019-02-08 ENCOUNTER — Other Ambulatory Visit: Payer: Self-pay | Admitting: Internal Medicine

## 2019-02-08 DIAGNOSIS — R1084 Generalized abdominal pain: Secondary | ICD-10-CM

## 2019-02-09 ENCOUNTER — Ambulatory Visit
Admission: RE | Admit: 2019-02-09 | Discharge: 2019-02-09 | Disposition: A | Discharge status: Home / Self Care | Payer: Medicare Other | Source: Ambulatory Visit | Attending: Internal Medicine | Admitting: Internal Medicine

## 2019-02-09 ENCOUNTER — Other Ambulatory Visit: Payer: Self-pay

## 2019-02-09 DIAGNOSIS — R1084 Generalized abdominal pain: Secondary | ICD-10-CM | POA: Diagnosis not present

## 2019-02-09 LAB — POCT I-STAT CREATININE: Creatinine, Ser: 0.9 mg/dL (ref 0.44–1.00)

## 2019-02-09 MED ORDER — IOHEXOL 300 MG/ML  SOLN
75.0000 mL | Freq: Once | INTRAMUSCULAR | Status: AC | PRN
  Administered 2019-02-09: 75 mL via INTRAVENOUS

## 2019-02-20 ENCOUNTER — Other Ambulatory Visit: Payer: Self-pay | Admitting: Internal Medicine

## 2019-02-20 DIAGNOSIS — R079 Chest pain, unspecified: Secondary | ICD-10-CM

## 2019-02-20 DIAGNOSIS — R0609 Other forms of dyspnea: Secondary | ICD-10-CM

## 2019-02-28 ENCOUNTER — Other Ambulatory Visit: Payer: Self-pay

## 2019-02-28 ENCOUNTER — Ambulatory Visit
Admission: RE | Admit: 2019-02-28 | Discharge: 2019-02-28 | Disposition: A | Discharge status: Home / Self Care | Payer: Medicare Other | Source: Ambulatory Visit | Attending: Internal Medicine | Admitting: Internal Medicine

## 2019-02-28 DIAGNOSIS — R0609 Other forms of dyspnea: Secondary | ICD-10-CM | POA: Diagnosis present

## 2019-02-28 DIAGNOSIS — R079 Chest pain, unspecified: Secondary | ICD-10-CM | POA: Diagnosis not present

## 2019-02-28 MED ORDER — IOHEXOL 300 MG/ML  SOLN
75.0000 mL | Freq: Once | INTRAMUSCULAR | Status: AC | PRN
  Administered 2019-02-28: 75 mL via INTRAVENOUS

## 2019-05-05 IMAGING — CT CT HIP*R* W/O CM
1 series · 15 of 32 positions shown, 19 images · non-contrast
Comparison: Radiographs dated 10/23/2016

CLINICAL DATA: Severe right hip pain extending laterally into the
leg. Hip fracture in March 2016 treated with open reduction and
internal fixation.

EXAM:
CT OF THE RIGHT HIP WITHOUT CONTRAST
TECHNIQUE: Multidetector CT imaging of the right hip was performed according to
the standard protocol. Multiplanar CT image reconstructions were
also generated.

[Series 10: axial soft tissue · axial · 0.37mm/px · z∈[-1083,-719]mm · 15 of 203 slices shown, 19 images]
[im 14/203  soft-tissue]
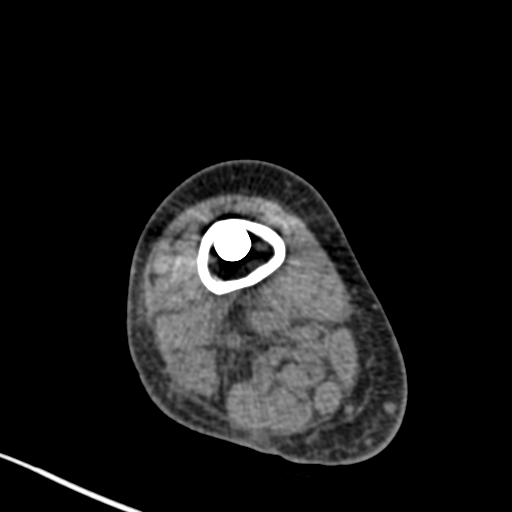
[im 14/203  bone]
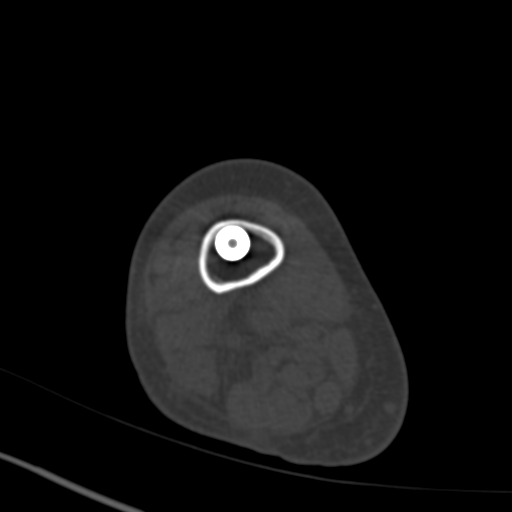
[im 27/203  soft-tissue]
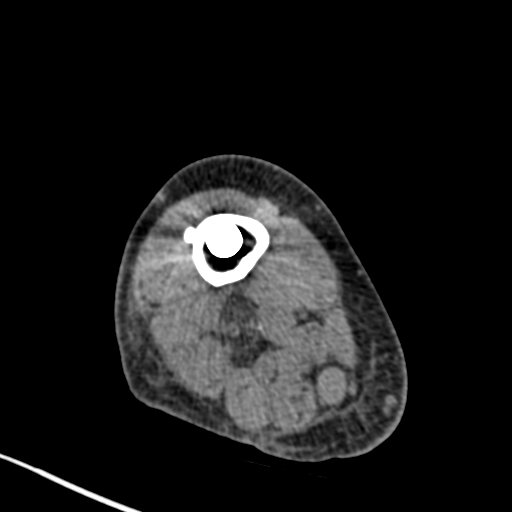
[im 40/203  soft-tissue]
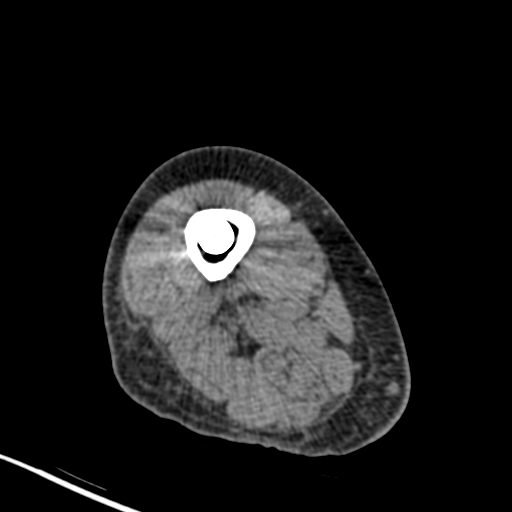
[im 59/203  soft-tissue]
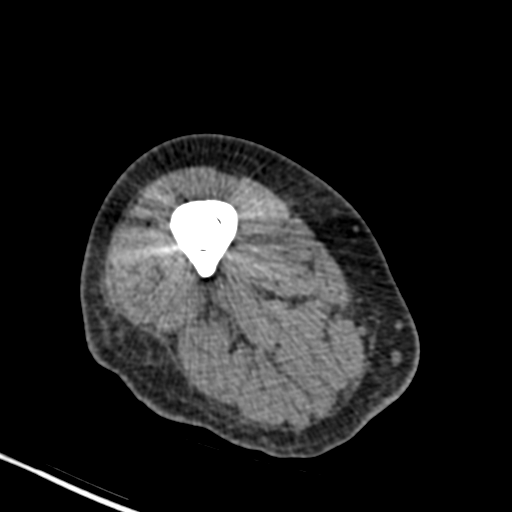
[im 72/203  soft-tissue]
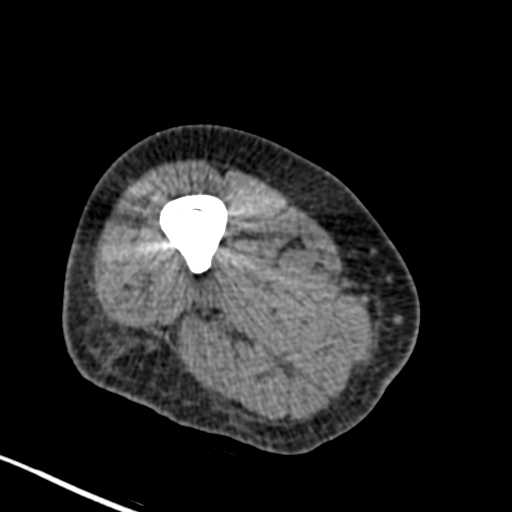
[im 85/203  soft-tissue]
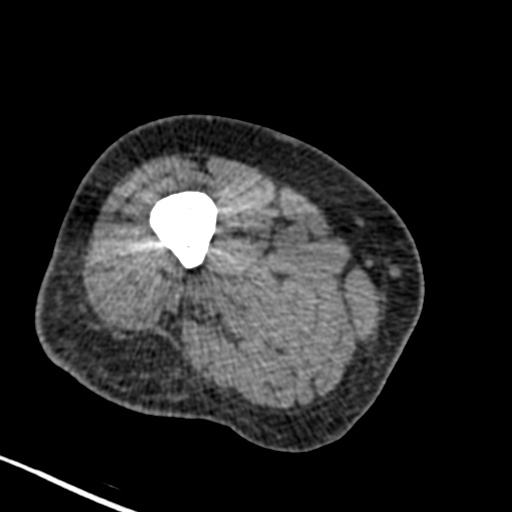
[im 105/203  soft-tissue]
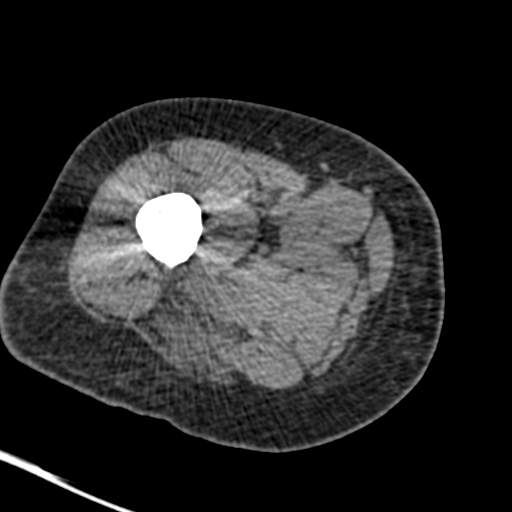
[im 118/203  soft-tissue]
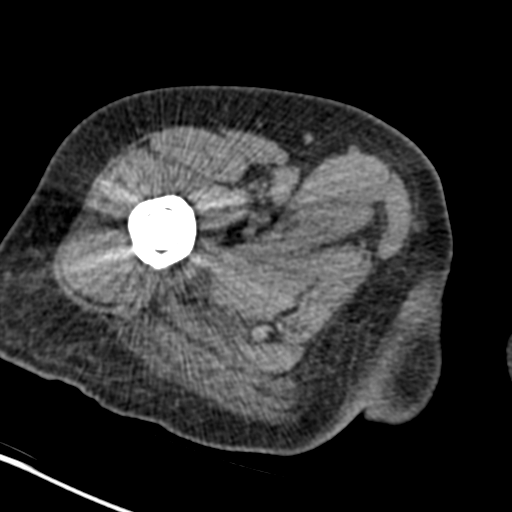
[im 131/203  soft-tissue]
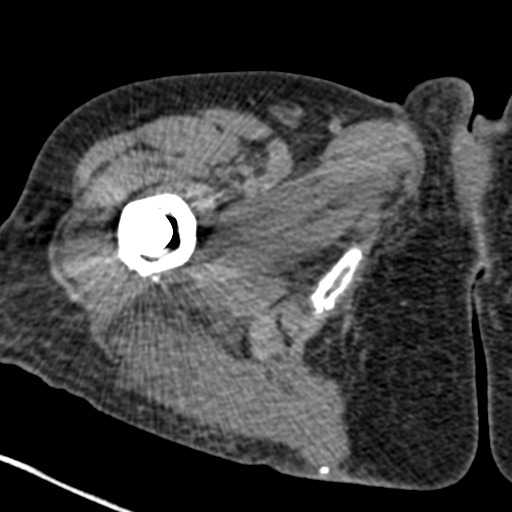
[im 131/203  bone]
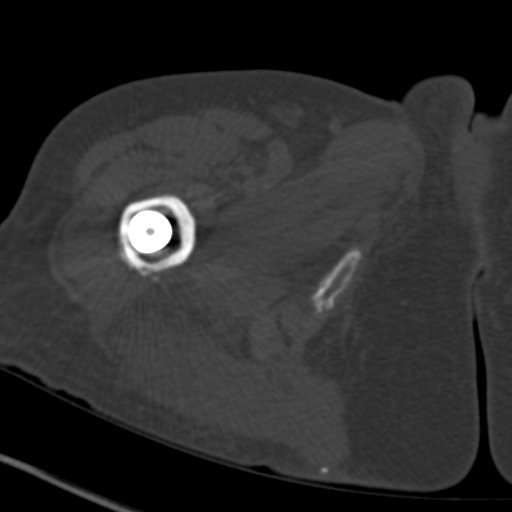
[im 144/203  soft-tissue]
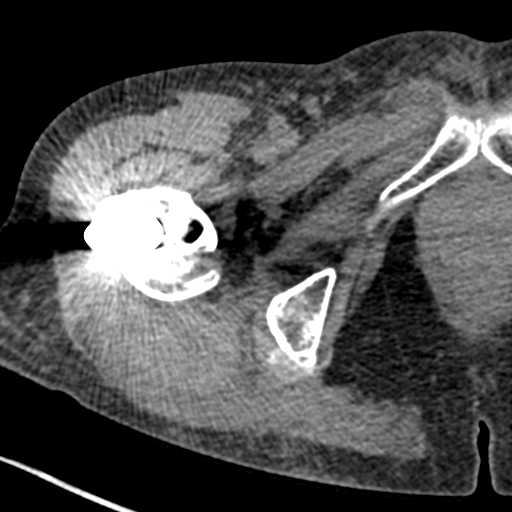
[im 163/203  soft-tissue]
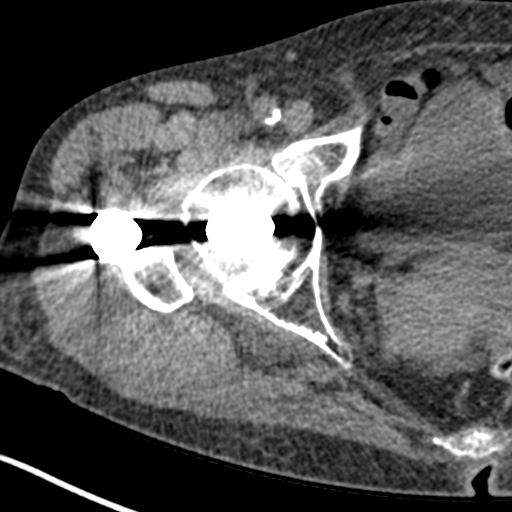
[im 176/203  soft-tissue]
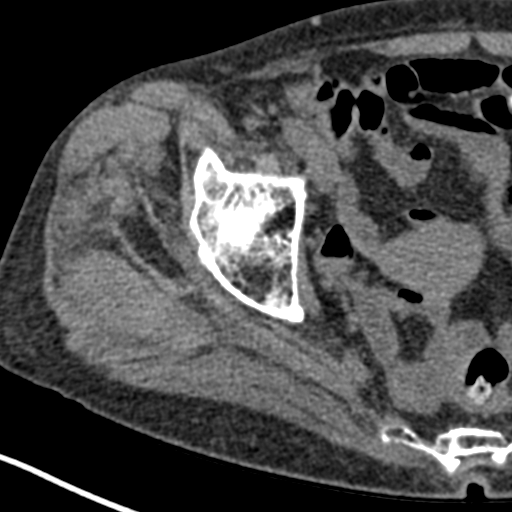
[im 176/203  lung]
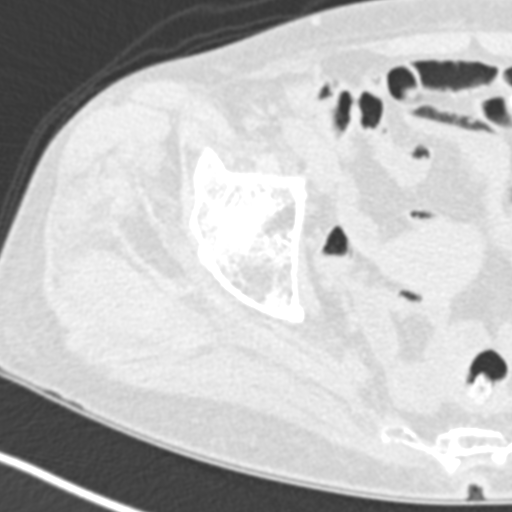
[im 183/203  lung]
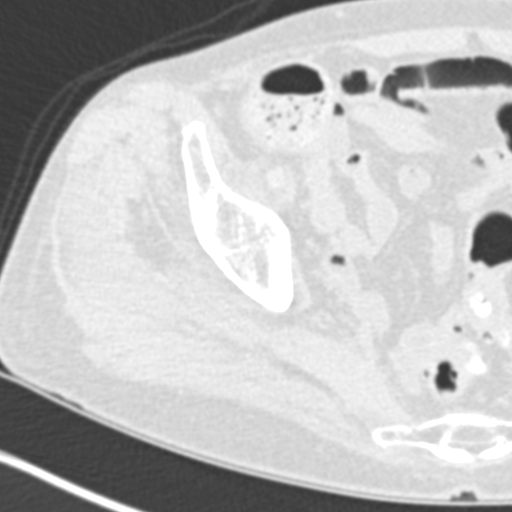
[im 189/203  soft-tissue]
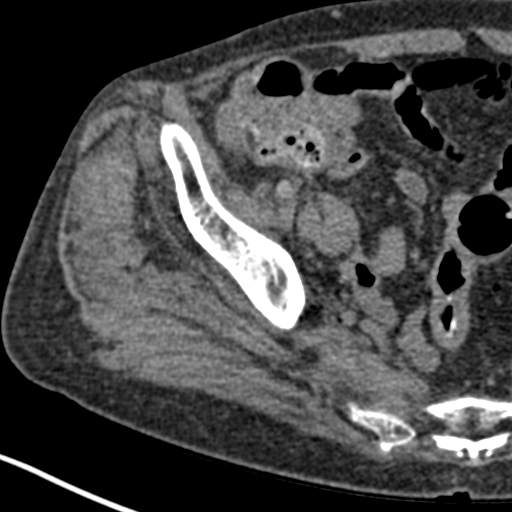
[im 189/203  lung]
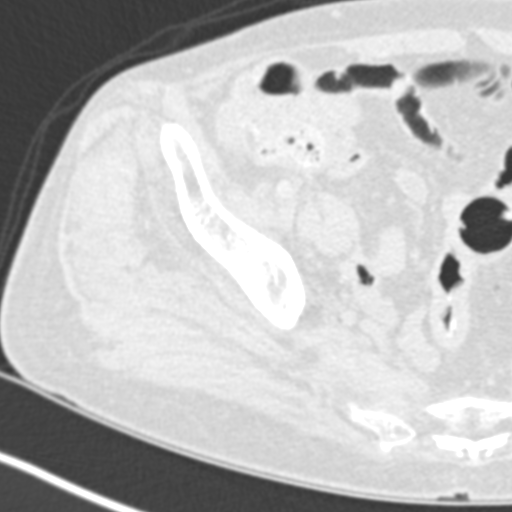
[im 196/203  lung]
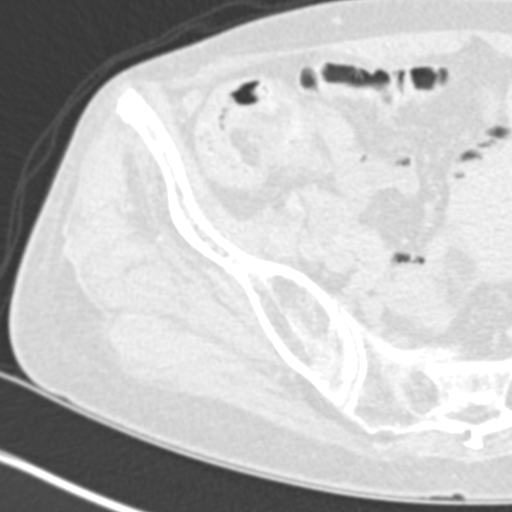

[15 of 32 positions shown; findings below may reference images not displayed]

FINDINGS: Bones/Joint/Cartilage

There is slight sclerosis of the superior aspect of femoral head
with several areas of lucency in the subcortical region. This could
represent early arthritis or very early avascular necrosis.

Two screws in the intramedullary nail are in place in good position
with no evidence of loosening. Solid union of the previous
intertrochanteric fracture. Calcified fragment of bone lies
anteromedial to the femoral neck. This is not felt to be
significant.

Soft tissues

Negative.
IMPRESSION: 1. No acute abnormalities.
2. Slight sclerosis of the superior aspect of the femoral head. I
suspect this represents early arthritic change but very early
avascular necrosis could give this appearance.

## 2019-05-11 ENCOUNTER — Telehealth: Payer: Self-pay | Admitting: Nurse Practitioner

## 2019-05-11 NOTE — Telephone Encounter (Signed)
Spoke with patient's daughter Trenton Gammon regarding Palliative services and all questions were answered.  Daughter requested to wait and schedule something after the first of the year because she wanted to speak to her Mom about Palliative services first but didn't want to do this prior to the holiday.  Daughter stated that patient was not aware of the Palliative referral.  Daughter took my name and contact number and said that she will call me back in January to schedule.

## 2019-06-22 ENCOUNTER — Telehealth: Payer: Self-pay | Admitting: Nurse Practitioner

## 2019-06-22 NOTE — Telephone Encounter (Signed)
Follow-up call to patient's daughter Kenney Houseman to see if they wanted to pursue Palliative services and daughter wanted to call me back next week to let me know.  She said she was going to speak to her Mom about this this weekend.

## 2019-06-29 ENCOUNTER — Telehealth: Payer: Self-pay | Admitting: Nurse Practitioner

## 2019-06-29 NOTE — Telephone Encounter (Signed)
Called patient's daughter Kenney Houseman back to see if they were going to pursue Palliative services or not, no answer - left my contact information requesting a call back.

## 2019-07-03 ENCOUNTER — Telehealth: Payer: Self-pay | Admitting: Nurse Practitioner

## 2019-07-03 NOTE — Telephone Encounter (Signed)
Spoke with patient's daughter Kenney Houseman regarding Palliative services and she said that she has talked with her Mom and Ms. Contrera has declined any type of help at this time.  Daughter was in agreement with cancelling the referral at this time and I also instructed daughter to reach back out to MD office if patient changes her mind about Palliative services and daughter was in agreement with this.  I also emailed the daughter a Palliative Brochure to look over and also to share this with the patient as well.  Will cancel the referral and notify MD.

## 2019-07-09 DIAGNOSIS — G3184 Mild cognitive impairment, so stated: Secondary | ICD-10-CM | POA: Diagnosis present

## 2019-12-07 ENCOUNTER — Inpatient Hospital Stay
Admission: EM | Admit: 2019-12-07 | Discharge: 2019-12-08 | DRG: 069 | Disposition: A | Discharge status: Home Health | Payer: Medicare Other | Attending: Internal Medicine | Admitting: Internal Medicine

## 2019-12-07 ENCOUNTER — Encounter: Payer: Self-pay | Admitting: Emergency Medicine

## 2019-12-07 ENCOUNTER — Other Ambulatory Visit: Payer: Self-pay

## 2019-12-07 ENCOUNTER — Observation Stay: Payer: Medicare Other

## 2019-12-07 ENCOUNTER — Emergency Department: Payer: Medicare Other

## 2019-12-07 DIAGNOSIS — Z83438 Family history of other disorder of lipoprotein metabolism and other lipidemia: Secondary | ICD-10-CM | POA: Diagnosis not present

## 2019-12-07 DIAGNOSIS — Z9049 Acquired absence of other specified parts of digestive tract: Secondary | ICD-10-CM | POA: Diagnosis not present

## 2019-12-07 DIAGNOSIS — Z96641 Presence of right artificial hip joint: Secondary | ICD-10-CM | POA: Diagnosis not present

## 2019-12-07 DIAGNOSIS — D510 Vitamin B12 deficiency anemia due to intrinsic factor deficiency: Secondary | ICD-10-CM | POA: Diagnosis not present

## 2019-12-07 DIAGNOSIS — M8580 Other specified disorders of bone density and structure, unspecified site: Secondary | ICD-10-CM | POA: Diagnosis not present

## 2019-12-07 DIAGNOSIS — Z8249 Family history of ischemic heart disease and other diseases of the circulatory system: Secondary | ICD-10-CM

## 2019-12-07 DIAGNOSIS — J449 Chronic obstructive pulmonary disease, unspecified: Secondary | ICD-10-CM | POA: Diagnosis not present

## 2019-12-07 DIAGNOSIS — F17210 Nicotine dependence, cigarettes, uncomplicated: Secondary | ICD-10-CM | POA: Diagnosis not present

## 2019-12-07 DIAGNOSIS — M069 Rheumatoid arthritis, unspecified: Secondary | ICD-10-CM | POA: Diagnosis present

## 2019-12-07 DIAGNOSIS — Z885 Allergy status to narcotic agent status: Secondary | ICD-10-CM

## 2019-12-07 DIAGNOSIS — Z20822 Contact with and (suspected) exposure to covid-19: Secondary | ICD-10-CM | POA: Diagnosis not present

## 2019-12-07 DIAGNOSIS — E039 Hypothyroidism, unspecified: Secondary | ICD-10-CM | POA: Diagnosis not present

## 2019-12-07 DIAGNOSIS — F419 Anxiety disorder, unspecified: Secondary | ICD-10-CM | POA: Diagnosis not present

## 2019-12-07 DIAGNOSIS — Z79899 Other long term (current) drug therapy: Secondary | ICD-10-CM | POA: Diagnosis not present

## 2019-12-07 DIAGNOSIS — G894 Chronic pain syndrome: Secondary | ICD-10-CM | POA: Diagnosis not present

## 2019-12-07 DIAGNOSIS — R636 Underweight: Secondary | ICD-10-CM | POA: Diagnosis present

## 2019-12-07 DIAGNOSIS — G459 Transient cerebral ischemic attack, unspecified: Secondary | ICD-10-CM | POA: Diagnosis present

## 2019-12-07 DIAGNOSIS — Z79890 Hormone replacement therapy (postmenopausal): Secondary | ICD-10-CM | POA: Diagnosis not present

## 2019-12-07 DIAGNOSIS — M810 Age-related osteoporosis without current pathological fracture: Secondary | ICD-10-CM | POA: Diagnosis not present

## 2019-12-07 DIAGNOSIS — Z882 Allergy status to sulfonamides status: Secondary | ICD-10-CM

## 2019-12-07 DIAGNOSIS — I700 Atherosclerosis of aorta: Secondary | ICD-10-CM | POA: Diagnosis present

## 2019-12-07 DIAGNOSIS — R4701 Aphasia: Secondary | ICD-10-CM | POA: Diagnosis present

## 2019-12-07 DIAGNOSIS — K219 Gastro-esophageal reflux disease without esophagitis: Secondary | ICD-10-CM | POA: Diagnosis not present

## 2019-12-07 DIAGNOSIS — R109 Unspecified abdominal pain: Secondary | ICD-10-CM | POA: Diagnosis present

## 2019-12-07 DIAGNOSIS — Z681 Body mass index (BMI) 19 or less, adult: Secondary | ICD-10-CM

## 2019-12-07 DIAGNOSIS — Z720 Tobacco use: Secondary | ICD-10-CM | POA: Diagnosis present

## 2019-12-07 DIAGNOSIS — F329 Major depressive disorder, single episode, unspecified: Secondary | ICD-10-CM | POA: Diagnosis not present

## 2019-12-07 DIAGNOSIS — Z888 Allergy status to other drugs, medicaments and biological substances status: Secondary | ICD-10-CM

## 2019-12-07 DIAGNOSIS — K5730 Diverticulosis of large intestine without perforation or abscess without bleeding: Secondary | ICD-10-CM | POA: Diagnosis present

## 2019-12-07 DIAGNOSIS — Z881 Allergy status to other antibiotic agents status: Secondary | ICD-10-CM

## 2019-12-07 LAB — URINALYSIS, COMPLETE (UACMP) WITH MICROSCOPIC
Bacteria, UA: NONE SEEN
Bilirubin Urine: NEGATIVE
Glucose, UA: NEGATIVE mg/dL
Hgb urine dipstick: NEGATIVE
Ketones, ur: NEGATIVE mg/dL
Leukocytes,Ua: NEGATIVE
Nitrite: NEGATIVE
Protein, ur: NEGATIVE mg/dL
Specific Gravity, Urine: 1.034 — ABNORMAL HIGH (ref 1.005–1.030)
pH: 7 (ref 5.0–8.0)

## 2019-12-07 LAB — CBC WITH DIFFERENTIAL/PLATELET
Abs Immature Granulocytes: 0.03 10*3/uL (ref 0.00–0.07)
Basophils Absolute: 0 10*3/uL (ref 0.0–0.1)
Basophils Relative: 0 %
Eosinophils Absolute: 0 10*3/uL (ref 0.0–0.5)
Eosinophils Relative: 1 %
HCT: 38 % (ref 36.0–46.0)
Hemoglobin: 12.8 g/dL (ref 12.0–15.0)
Immature Granulocytes: 0 %
Lymphocytes Relative: 13 %
Lymphs Abs: 0.9 10*3/uL (ref 0.7–4.0)
MCH: 31.8 pg (ref 26.0–34.0)
MCHC: 33.7 g/dL (ref 30.0–36.0)
MCV: 94.3 fL (ref 80.0–100.0)
Monocytes Absolute: 0.4 10*3/uL (ref 0.1–1.0)
Monocytes Relative: 5 %
Neutro Abs: 5.7 10*3/uL (ref 1.7–7.7)
Neutrophils Relative %: 81 %
Platelets: 268 10*3/uL (ref 150–400)
RBC: 4.03 MIL/uL (ref 3.87–5.11)
RDW: 12.4 % (ref 11.5–15.5)
WBC: 7 10*3/uL (ref 4.0–10.5)
nRBC: 0 % (ref 0.0–0.2)

## 2019-12-07 LAB — COMPREHENSIVE METABOLIC PANEL
ALT: 14 U/L (ref 0–44)
AST: 23 U/L (ref 15–41)
Albumin: 4 g/dL (ref 3.5–5.0)
Alkaline Phosphatase: 65 U/L (ref 38–126)
Anion gap: 11 (ref 5–15)
BUN: 19 mg/dL (ref 8–23)
CO2: 27 mmol/L (ref 22–32)
Calcium: 8.6 mg/dL — ABNORMAL LOW (ref 8.9–10.3)
Chloride: 101 mmol/L (ref 98–111)
Creatinine, Ser: 0.97 mg/dL (ref 0.44–1.00)
GFR calc Af Amer: >60 mL/min (ref >60)
GFR calc non Af Amer: 56 mL/min — ABNORMAL LOW (ref >60)
Glucose, Bld: 128 mg/dL — ABNORMAL HIGH (ref 70–99)
Potassium: 3.7 mmol/L (ref 3.5–5.1)
Sodium: 139 mmol/L (ref 135–145)
Total Bilirubin: 0.6 mg/dL (ref 0.3–1.2)
Total Protein: 7.3 g/dL (ref 6.5–8.1)

## 2019-12-07 LAB — LIPASE, BLOOD: Lipase: 24 U/L (ref 11–51)

## 2019-12-07 LAB — TROPONIN I (HIGH SENSITIVITY)
Troponin I (High Sensitivity): 14 ng/L (ref <18)
Troponin I (High Sensitivity): 14 ng/L (ref <18)

## 2019-12-07 MED ORDER — ENOXAPARIN SODIUM 40 MG/0.4ML ~~LOC~~ SOLN
40.0000 mg | SUBCUTANEOUS | Status: DC
  Administered 2019-12-07: 40 mg via SUBCUTANEOUS
  Filled 2019-12-07: qty 0.4

## 2019-12-07 MED ORDER — SODIUM CHLORIDE 0.9 % IV SOLN: INTRAVENOUS | Status: DC

## 2019-12-07 MED ORDER — ASPIRIN 300 MG RE SUPP: 300.0000 mg | Freq: Every day | RECTAL | Status: DC

## 2019-12-07 MED ORDER — PANTOPRAZOLE SODIUM 40 MG PO TBEC
40.0000 mg | DELAYED_RELEASE_TABLET | Freq: Every day | ORAL | Status: DC
  Administered 2019-12-08: 40 mg via ORAL
  Filled 2019-12-07 (×2): qty 1

## 2019-12-07 MED ORDER — IOHEXOL 300 MG/ML  SOLN
75.0000 mL | Freq: Once | INTRAMUSCULAR | Status: AC | PRN
  Administered 2019-12-07: 75 mL via INTRAVENOUS

## 2019-12-07 MED ORDER — SENNOSIDES-DOCUSATE SODIUM 8.6-50 MG PO TABS: 1.0000 | ORAL_TABLET | Freq: Every evening | ORAL | Status: DC | PRN

## 2019-12-07 MED ORDER — NICOTINE 21 MG/24HR TD PT24
21.0000 mg | MEDICATED_PATCH | Freq: Every day | TRANSDERMAL | Status: DC
  Administered 2019-12-07 – 2019-12-08 (×2): 21 mg via TRANSDERMAL
  Filled 2019-12-07 (×2): qty 1

## 2019-12-07 MED ORDER — STROKE: EARLY STAGES OF RECOVERY BOOK: Freq: Once | Status: AC

## 2019-12-07 MED ORDER — ASPIRIN EC 325 MG PO TBEC
325.0000 mg | DELAYED_RELEASE_TABLET | Freq: Every day | ORAL | Status: DC
  Administered 2019-12-07 – 2019-12-08 (×2): 325 mg via ORAL
  Filled 2019-12-07 (×2): qty 1

## 2019-12-07 NOTE — ED Notes (Signed)
Pt ambulatory to bedside toilet with standby assist.  

## 2019-12-07 NOTE — Progress Notes (Signed)
Pt's home medications were counted to send to pharmacy, however, pt's daughter came home an is going to take them home with her.

## 2019-12-07 NOTE — ED Triage Notes (Signed)
Says side pain, anterior chest pain.  Says this started 2 nights ago.  Also righ t eye and right ear pain and vision is blurry right eye.  Pain across low abd.  Says last night was dry heaving.

## 2019-12-07 NOTE — H&P (Signed)
History and Physical    [name] IPJ:[id number] DOB: 1940-06-20 DOA: 12/07/2019  PCP: Rusty Aus, MD   I have briefly reviewed patients previous medical reports in [facility].  Patient coming from: Home  Chief Complaint: Slurred speech, lethargy, abdominal pain, dry heaves.  HPI: [name] is a 79 year old female, lives alone, usually independent but sustained right patellar fracture and since then has been ambulating with the help of walker, PMH of rheumatoid arthritis, chronic pain, constipation, COPD, ongoing tobacco abuse, gastritis/GERD, hypothyroid, osteopenia/osteoporosis, presented to the [facility] ED on 7/2 with above complaints.  Patient and her daughter at bedside provided history.  She reports that she was in her usual state of health until 2 nights prior to admission when she started experiencing mid abdominal pain, unable to clearly describe, intermittent, nonradiating, associated with nausea and dry heaves without vomiting.  No dysuria, urinary frequency, fever or chills.  Has been having daily BM with use of senna as needed.  Symptoms spontaneously improved during the daytime only to recur last night.  Denies eating anything unusual that would have precipitated her symptoms.  Reportedly did not sleep well last night due to these complaints.  When her daughter called her this morning, she sounded lethargic on the phone and had slurred speech.  Daughter drove from out of town and was with her and about a hour and a half and by that time her slurred speech had improved but not completely resolved.  This was not associated with facial asymmetry, asymmetric tingling, numbness or limb weakness.  Due to above symptoms, she decided to bring her to the ED for further evaluation.  Currently patient's mentation and speech have normalized-may have been abnormal for a total period of 2 hours or so.  Also her dry heaving and nausea have resolved.  She has tolerated some food.   Abdominal pain has improved but not completely resolved.  She denies use of NSAIDs.  She did have one episode of watery stools today without blood or mucus.  ED Course: Afebrile, vital signs stable and no hypoxia.  CBC, CMP unremarkable.  Troponin x2 -.  Urine microscopy not indicative of UTI.  COVID-19 testing pending.  Patient has received both doses of her COVID-19 vaccine couple of months ago.  CT abdomen and pelvis with contrast and CT head without contrast unremarkable.  [place] admission requested for possible TIA work-up and abdominal pain.  Review of Systems:  All other systems reviewed and apart from HPI, are negative.  She did report some right earache and questionable right eye symptoms which she is not able to elaborate which were transient and resolved.  Past Medical History:  Diagnosis Date  . Abdominal pain   . Anxiety   . Arthritis, rheumatoid ([place])   . Breast pain   . Chest discomfort   . Chest pain   . Chronic pain syndrome   . Collagen vascular disease (HCC)    hx rheumatoid arthritis.   . Constipation   . COPD (chronic obstructive pulmonary disease) ([place])   . Diverticulosis   . Fibrocystic breast disease   . Gastritis   . GERD (gastroesophageal reflux disease)   . Hemorrhoids   . Hx of colonic polyps   . Hypothyroidism   . Osteopenia   . Renal artery stenosis ([place])   . Right ankle pain   . Thyroid binding globulin deficiency   . Tobacco abuse     Past Surgical History:  Procedure Laterality Date  .  CHOLECYSTECTOMY    . JOINT REPLACEMENT     right hip  . LEG SURGERY    . TONSILLECTOMY      Social History  reports that she has been smoking cigarettes. She has been smoking about 1.00 pack per day. She has never used smokeless tobacco. She reports that she does not drink alcohol and does not use drugs.  Allergies  Allergen Reactions  . Other Other (See Comments)    Uncoded Allergy. Allergen: symmetral Uncoded Allergy. Allergen: etrafon Uncoded Allergy.  Allergen: desbutal Uncoded Allergy. Allergen: symmetral Uncoded Allergy. Allergen: etrafon Uncoded Allergy. Allergen: desbutal Other reaction(s): Unknown Uncoded Allergy. Allergen: symmetral Other reaction(s): Unknown Uncoded Allergy. Allergen: etrafon Uncoded Allergy. Allergen: symmetral Uncoded Allergy. Allergen: etrafon Uncoded Allergy. Allergen: desbutal   . Sulfa Antibiotics     Other reaction(s): Unknown Uncoded Allergy. Allergen: desbutal  . Etrafon [Perphenazine-Amitriptyline]   . Hydrocodone Nausea And Vomiting  . Mirtazapine     Other reaction(s): Hallucination  . Doxycycline Nausea Only  . Percocet [Oxycodone-Acetaminophen] Nausea And Vomiting  . Sertraline Palpitations    Family History  Problem Relation Age of Onset  . Heart disease Sister   . Hypertension Brother   . Heart attack Brother   . Hyperlipidemia Brother   . Breast cancer Neg Hx      Prior to Admission medications   Medication Sig Start Date End Date Taking? Authorizing Provider  albuterol (PROVENTIL HFA;VENTOLIN HFA) 108 (90 Base) MCG/ACT inhaler Inhale 2 puffs into the lungs every 6 (six) hours as needed for wheezing or shortness of breath. 11/21/17   Flora Lipps, MD  aluminum-magnesium hydroxide-simethicone (MAALOX) 200-200-20 MG/5ML SUSP Take 30 mLs by mouth 4 (four) times daily -  before meals and at bedtime. 02/21/18   Carrie Mew, MD  clonazePAM (KLONOPIN) 0.5 MG tablet Take 0.5 mg by mouth 2 (two) times daily.    [provider]  DENOSUMAB [place] Inject into the skin.    [provider]  estradiol (ESTRACE) 0.1 MG/GM vaginal cream Place 0.1 g vaginally 2 (two) times a week. 08/11/16   [provider]  famotidine (PEPCID) 20 MG tablet Take 1 tablet (20 mg total) by mouth 2 (two) times daily. 02/21/18   Carrie Mew, MD  folic acid (FOLVITE) 1 MG tablet Take 1 tablet by mouth daily. 10/21/14   [provider]  hydroxychloroquine (PLAQUENIL) 200 MG tablet  TAKE ONE TABLET TWICE DAILY 01/03/15   [provider]  levothyroxine (SYNTHROID, LEVOTHROID) 88 MCG tablet Take 88 mcg by mouth daily.    [provider]  Melatonin 5 MG TABS Take 5 mg by mouth at bedtime.    [provider]  Methotrexate Sodium (METHOTREXATE, PF,) 200 MG/8ML injection ADMINISTER 0.8MLS ONCE A WEEK 11/27/14   [provider]  Multiple Vitamins-Minerals (MULTIVITAMIN WITH MINERALS) tablet Take 1 tablet by mouth daily.    [provider]  nitroGLYCERIN (NITRODUR - DOSED IN MG/24 HR) 0.2 mg/hr patch Place 1/4 to 1/2 of a patch over affected region. Remove and replace once daily.  Slightly alter skin placement daily 11/16/16   Gerda Diss, DO  omeprazole (PRILOSEC) 40 MG capsule Take 1 capsule by mouth daily. 07/03/15   [provider]  polyethylene glycol powder (GLYCOLAX/MIRALAX) powder Dissolved 1 tablespoon in 4-8 ounces of water or juice up and drink up to 3 times daily as needed for constipation. 07/08/15   Joanne Gavel, MD  Probiotic Product (PROBIOTIC FORMULA PO) Take by mouth.  [provider]  pseudoephedrine (SUDAFED) 30 MG tablet Take 30 mg by mouth every 4 (four) hours as needed for congestion.    [provider]  senna (SENOKOT) 8.6 MG tablet Take 16 mg by mouth.    [provider]  traMADol (ULTRAM) 50 MG tablet Take 0.5 tablets by mouth 2 (two) times daily as needed.    [provider]  venlafaxine (EFFEXOR) 37.5 MG tablet Take 37.5 mg by mouth 2 (two) times daily.    [provider]  vitamin C (ASCORBIC ACID) 500 MG tablet Take 500 mg by mouth daily.    [provider]  VITAMIN D, CHOLECALCIFEROL, PO Take 1 tablet by mouth daily.    [provider]    Physical Exam: Vitals:   12/07/19 1245 12/07/19 1316 12/07/19 1400 12/07/19 1700  BP: 125/88 116/78 132/67 (!) 142/80  Pulse: 80 74 72 74  Resp: 16 17 17  (!) 21  Temp: 98.7 F (37.1 C)       TempSrc: Oral     SpO2: 99% 100% 100% 99%  Weight:      Height:          Constitutional: Elderly female, moderately built, frail and thinly nourished lying comfortably propped up in bed without distress.  Oral mucosa with borderline hydration. Eyes: PERTLA, lids and conjunctivae normal ENMT: Mucous membranes are moist. Posterior pharynx clear of any exudate or lesions. Normal dentition.  Neck: supple, no masses, no thyromegaly Respiratory: Clear to auscultation without wheezing, rhonchi or crackles. No increased work of breathing. Cardiovascular: S1 & S2 heard, regular rate and rhythm. No JVD, murmurs, rubs or clicks. No pedal edema. Abdomen: Non distended.  Minimal periumbilical tenderness without rigidity, guarding or rebound. Soft. No organomegaly or masses appreciated. No clinical Ascites. Normal bowel sounds heard. Musculoskeletal: no clubbing / cyanosis. No joint deformity upper and lower extremities. Good ROM, no contractures. Normal muscle tone.  Skin: no rashes, lesions, ulcers. No induration Neurologic: CN 2-12 grossly intact. Sensation intact, DTR normal. Strength 5/5 in all 4 limbs.  Psychiatric: Normal judgment and insight. Alert and oriented x 3. Normal mood.     Labs on Admission: I have personally reviewed following labs and imaging studies  CBC: Recent Labs  Lab 12/07/19 1310  WBC 7.0  NEUTROABS 5.7  HGB 12.8  HCT 38.0  MCV 94.3  PLT 220    Basic Metabolic Panel: Recent Labs  Lab 12/07/19 1310  NA 139  K 3.7  CL 101  CO2 27  GLUCOSE 128*  BUN 19  CREATININE 0.97  CALCIUM 8.6*    Liver Function Tests: Recent Labs  Lab 12/07/19 1310  AST 23  ALT 14  ALKPHOS 65  BILITOT 0.6  PROT 7.3  ALBUMIN 4.0    Urine analysis:    Component Value Date/Time   COLORURINE YELLOW (A) 12/07/2019 1508   APPEARANCEUR CLEAR (A) 12/07/2019 1508   LABSPEC 1.034 (H) 12/07/2019 1508   PHURINE 7.0 12/07/2019 1508   GLUCOSEU NEGATIVE 12/07/2019 1508   HGBUR  NEGATIVE 12/07/2019 1508   BILIRUBINUR NEGATIVE 12/07/2019 1508   KETONESUR NEGATIVE 12/07/2019 1508   PROTEINUR NEGATIVE 12/07/2019 1508   UROBILINOGEN 0.2 03/06/2007 2115   NITRITE NEGATIVE 12/07/2019 1508   LEUKOCYTESUR NEGATIVE 12/07/2019 1508     Radiological Exams on Admission: CT Head Wo Contrast  Result Date: 12/07/2019 CLINICAL DATA:  Right eye and ear pain, blurry vision EXAM: CT HEAD WITHOUT CONTRAST TECHNIQUE: Contiguous axial images were obtained from the base  of the skull through the vertex without intravenous contrast. COMPARISON:  08/28/2018 FINDINGS: Brain: No evidence of acute infarction, hemorrhage, hydrocephalus, extra-axial collection or mass lesion/mass effect. Periventricular white matter hypodensity. Vascular: No hyperdense vessel or unexpected calcification. Skull: Normal. Negative for fracture or focal lesion. Sinuses/Orbits: No acute finding. Other: None. IMPRESSION: No acute intracranial pathology. Small-vessel white matter disease. Electronically Signed   By: Eddie Candle M.D.   On: 12/07/2019 14:40   CT Abdomen Pelvis W Contrast  Result Date: 12/07/2019 CLINICAL DATA:  Low abdominal pain for 2 days EXAM: CT ABDOMEN AND PELVIS WITH CONTRAST TECHNIQUE: Multidetector CT imaging of the abdomen and pelvis was performed using the standard protocol following bolus administration of intravenous contrast. CONTRAST:  55mL OMNIPAQUE IOHEXOL 300 MG/ML  SOLN COMPARISON:  02/09/2019 FINDINGS: Lower chest: No acute abnormality. Hepatobiliary: No focal liver abnormality is seen. Status post cholecystectomy. Postoperative biliary dilatation. Pancreas: Unremarkable. No pancreatic ductal dilatation or surrounding inflammatory changes. Spleen: Normal in size without significant abnormality. Adrenals/Urinary Tract: Adrenal glands are unremarkable. Kidneys are normal, without renal calculi, solid lesion, or hydronephrosis. Bladder is unremarkable. Stomach/Bowel: Stomach is within normal  limits. Appendix is not clearly visualized. No evidence of bowel wall thickening, distention, or inflammatory changes. Sigmoid diverticulosis. Vascular/Lymphatic: Aortic atherosclerosis. No enlarged abdominal or pelvic lymph nodes. Reproductive: No mass or other significant abnormality. Other: No abdominal wall hernia or abnormality. No abdominopelvic ascites. Musculoskeletal: No acute or significant osseous findings. IMPRESSION: 1. No acute CT findings of the abdomen or pelvis to explain lower abdominal pain. 2. Sigmoid diverticulosis without evidence of acute diverticulitis. 3. Aortic Atherosclerosis (ICD10-I70.0). Electronically Signed   By: Eddie Candle M.D.   On: 12/07/2019 14:37    EKG: Independently reviewed.  Sinus rhythm, normal axis, no acute changes, RSR pattern in V1-2 which are not new.  Assessment/Plan Principal Problem:   TIA (transient ischemic attack) Active Problems:   Chronic pain syndrome   Arthritis, rheumatoid (HCC)   GERD (gastroesophageal reflux disease)   Tobacco abuse   Abdominal pain   COPD (chronic obstructive pulmonary disease) (HCC)     Suspected TIA: CT head without acute findings.  No focal deficits at this time.  TIA work-up including MRI, MRA brain, 2D echo, carotid Dopplers, fasting lipids and A1c.  Aspirin initiated.  PT, OT and ST evaluation.  Abdominal pain and dry heaves: Unclear etiology.  CT abdomen without acute findings.  Possible acute gastroenteritis versus GERD.  Spontaneously improving without specific treatment.  Diet as tolerated, gentle IV fluids due to appearing clinically dry.  PPI.  Monitor closely.  Tobacco abuse: Cessation counseled.  Nicotine patch per request.  COPD: No clinical bronchospasm.  Rheumatoid arthritis/chronic pain and other medical problems: Stable.  Resume home medications after reconciliation by pharmacy.   DVT prophylaxis: Lovenox Code Status: Full, confirmed with patient in the presence of her daughter at  bedside. Family Communication: Discussed in detail with patient's daughter at bedside, updated care and answered questions Disposition Plan:   Patient is from:  Home  Anticipated DC to:  Home  Anticipated DC date:  12/08/2019  Anticipated DC barriers: None   Consults called: None Admission status: Observation, telemetry  Severity of Illness: The appropriate patient status for this patient is OBSERVATION. Observation status is judged to be reasonable and necessary in order to provide the required intensity of service to ensure the patient's safety. The patient's presenting symptoms, physical exam findings, and initial radiographic and laboratory data in the context of their medical condition is  felt to place them at decreased risk for further clinical deterioration. Furthermore, it is anticipated that the patient will be medically stable for discharge from the hospital within 2 midnights of admission. The following factors support the patient status of observation.   " The patient's presenting symptoms include abdominal pain, dry heaves, slurred speech and lethargy. " The physical exam findings include borderline oral mucosa and minimal periumbilical abdominal tenderness. " The initial radiographic and laboratory data are remarkable CBC, CMP, urine microscopy CT head, CT abdomen and pelvis.      Vernell Leep MD [facility]  To contact the attending provider between 7A-7P or the covering provider during after hours 7P-7A, please log into the web site [email or web address] and access using universal [place] password for that web site. If you do not have the password, please call the hospital operator.  12/07/2019, 5:47 PM

## 2019-12-07 NOTE — ED Provider Notes (Signed)
[facility] Emergency Department Provider Note   ____________________________________________   First MD Initiated Contact with Patient 12/07/19 1251     (approximate)  I have reviewed the triage vital signs and the nursing notes.   HISTORY  Chief Complaint Abdominal Pain, Chest Pain, Nausea, Otalgia, and Eye Problem    HPI [name] is a 79 y.o. female with past medical history of rheumatoid arthritis, COPD, chronic pain, and anxiety who presents to the ED for abdominal pain.  Patient reports that she has had significant pain in her abdomen for the past couple of nights.  At night it has seemed to primarily affect her lower abdomen, but she then had some pain in her upper abdomen earlier this morning. Pain seemed to move up into the left side of her chest and while the chest pain has resolved she continues to have some upper and lower abdominal pain. Patient's daughter is also concerned that she had some difficulty speaking earlier this morning. Daughter states that the patient was not making sense and seemed to have trouble getting words out. This lasted for 30 minutes to 1 hour, was not associated with any numbness or weakness, and has since resolved. Patient denies any fevers, cough, shortness of breath, dysuria, or hematuria.        Past Medical History:  Diagnosis Date  . Abdominal pain   . Anxiety   . Arthritis, rheumatoid ([place])   . Breast pain   . Chest discomfort   . Chest pain   . Chronic pain syndrome   . Collagen vascular disease (HCC)    hx rheumatoid arthritis.   . Constipation   . COPD (chronic obstructive pulmonary disease) ([place])   . Diverticulosis   . Fibrocystic breast disease   . Gastritis   . GERD (gastroesophageal reflux disease)   . Hemorrhoids   . Hx of colonic polyps   . Hypothyroidism   . Osteopenia   . Renal artery stenosis ([place])   . Right ankle pain   . Thyroid binding globulin deficiency   . Tobacco abuse      Patient Active Problem List   Diagnosis Date Noted  . TIA (transient ischemic attack) 12/07/2019  . Cataracts, bilateral 11/24/2016  . Adenomatous polyps 11/24/2016  . Low back pain 11/24/2016  . Adult idiopathic generalized osteoporosis 06/08/2016  . Vitamin B12 deficiency anemia due to intrinsic factor deficiency 04/22/2016  . Closed intertrochanteric fracture of right femur ([place]) 03/30/2016  . History of nonmelanoma skin cancer 11/17/2015  . Compression fracture of lumbar vertebra ([place]) 05/27/2015  . Valvular heart disease 03/21/2015  . 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  . Pathological fracture due to osteoporosis 03/04/2015  . RA (rheumatoid arthritis) ([place]) 02/18/2015  . Arthritis, rheumatoid ([place])   . Thyroid binding globulin deficiency   . Breast pain   . GERD (gastroesophageal reflux disease)   . Tobacco abuse   . Chest discomfort   . Abdominal pain   . Hypothyroid   . Renal artery stenosis ([place])   . COPD (chronic obstructive pulmonary disease) ([place])   . Hx of colonic polyps   . Anxiety   . Hemorrhoids   . Gastritis   . Diverticulosis   . Fibrocystic breast disease   . Constipation   . Osteoporosis 10/10/2013  . Pernicious anemia 10/10/2013  . CHRONIC PAIN SYNDROME 08/03/2007    Past Surgical History:  Procedure Laterality Date  . CHOLECYSTECTOMY    . JOINT REPLACEMENT  right hip  . LEG SURGERY    . TONSILLECTOMY      Prior to Admission medications   Medication Sig Start Date End Date Taking? Authorizing Provider  albuterol (PROVENTIL HFA;VENTOLIN HFA) 108 (90 Base) MCG/ACT inhaler Inhale 2 puffs into the lungs every 6 (six) hours as needed for wheezing or shortness of breath. 11/21/17   Flora Lipps, MD  aluminum-magnesium hydroxide-simethicone (MAALOX) 200-200-20 MG/5ML SUSP Take 30 mLs by mouth 4 (four) times daily -  before meals and at bedtime. 02/21/18   Carrie Mew, MD  clonazePAM (KLONOPIN) 0.5 MG tablet Take 0.5 mg by  mouth 2 (two) times daily.    [provider]  DENOSUMAB [place] Inject into the skin.    [provider]  estradiol (ESTRACE) 0.1 MG/GM vaginal cream Place 0.1 g vaginally 2 (two) times a week. 08/11/16   [provider]  famotidine (PEPCID) 20 MG tablet Take 1 tablet (20 mg total) by mouth 2 (two) times daily. 02/21/18   Carrie Mew, MD  folic acid (FOLVITE) 1 MG tablet Take 1 tablet by mouth daily. 10/21/14   [provider]  hydroxychloroquine (PLAQUENIL) 200 MG tablet TAKE ONE TABLET TWICE DAILY 01/03/15   [provider]  levothyroxine (SYNTHROID, LEVOTHROID) 88 MCG tablet Take 88 mcg by mouth daily.    [provider]  Melatonin 5 MG TABS Take 5 mg by mouth at bedtime.    [provider]  Methotrexate Sodium (METHOTREXATE, PF,) 200 MG/8ML injection ADMINISTER 0.8MLS ONCE A WEEK 11/27/14   [provider]  Multiple Vitamins-Minerals (MULTIVITAMIN WITH MINERALS) tablet Take 1 tablet by mouth daily.    [provider]  nitroGLYCERIN (NITRODUR - DOSED IN MG/24 HR) 0.2 mg/hr patch Place 1/4 to 1/2 of a patch over affected region. Remove and replace once daily.  Slightly alter skin placement daily 11/16/16   Gerda Diss, DO  omeprazole (PRILOSEC) 40 MG capsule Take 1 capsule by mouth daily. 07/03/15   [provider]  polyethylene glycol powder (GLYCOLAX/MIRALAX) powder Dissolved 1 tablespoon in 4-8 ounces of water or juice up and drink up to 3 times daily as needed for constipation. 07/08/15   Joanne Gavel, MD  Probiotic Product (PROBIOTIC FORMULA PO) Take by mouth.    [provider]  pseudoephedrine (SUDAFED) 30 MG tablet Take 30 mg by mouth every 4 (four) hours as needed for congestion.    [provider]  senna (SENOKOT) 8.6 MG tablet Take 16 mg by mouth.    [provider]  traMADol (ULTRAM) 50 MG tablet Take 0.5 tablets by mouth 2 (two) times daily as needed.    [provider]  venlafaxine (EFFEXOR) 37.5 MG tablet Take 37.5 mg by mouth 2 (two) times daily.    [provider]  vitamin C (ASCORBIC ACID) 500 MG tablet Take 500 mg by mouth daily.    [provider]  VITAMIN D, CHOLECALCIFEROL, PO Take 1 tablet by mouth daily.    [provider]    Allergies Other, Sulfa antibiotics, Etrafon [perphenazine-amitriptyline], Hydrocodone, Mirtazapine, Doxycycline, Percocet [oxycodone-acetaminophen], and Sertraline  Family History  Problem Relation Age of Onset  . Heart disease Sister   . Hypertension Brother   . Heart attack Brother   . Hyperlipidemia Brother   . Breast cancer Neg Hx     Social History Social History   Tobacco Use  . Smoking status: Current Every Day Smoker    Packs/day: 1.00    Types: Cigarettes  .  Smokeless tobacco: Never Used  Substance Use Topics  . Alcohol use: No  . Drug use: No    Review of Systems  Constitutional: No fever/chills Eyes: No visual changes. ENT: No sore throat. Cardiovascular: Positive for chest pain. Respiratory: Denies shortness of breath. Gastrointestinal: Positive for abdominal pain.  No nausea, no vomiting.  No diarrhea.  No constipation. Genitourinary: Negative for dysuria. Musculoskeletal: Negative for back pain. Skin: Negative for rash. Neurological: Negative for headaches, focal weakness or numbness. Positive for speech difficulty.  ____________________________________________   PHYSICAL EXAM:  VITAL SIGNS: ED Triage Vitals  Enc Vitals Group     BP 12/07/19 1245 125/88     Pulse Rate 12/07/19 1245 80     Resp 12/07/19 1245 16     Temp 12/07/19 1245 98.7 F (37.1 C)     Temp Source 12/07/19 1245 Oral     SpO2 12/07/19 1245 99 %     Weight 12/07/19 1242 88 lb (39.9 kg)     Height 12/07/19 1242 5\' 4"  (1.626 m)     Head Circumference --      Peak Flow --      Pain Score 12/07/19 1242 8     Pain Loc --      Pain Edu? --      Excl. in [place]? --      Constitutional: Alert and oriented. Eyes: Conjunctivae are normal. Head: Atraumatic. Nose: No congestion/rhinnorhea. Mouth/Throat: Mucous membranes are moist. Neck: Normal ROM Cardiovascular: Normal rate, regular rhythm. Grossly normal heart sounds. Respiratory: Normal respiratory effort.  No retractions. Lungs CTAB. Gastrointestinal: Soft and diffusely tender to palpation with no rebound or guarding. No distention. Genitourinary: deferred Musculoskeletal: No lower extremity tenderness nor edema. Neurologic:  Normal speech and language. No gross focal neurologic deficits are appreciated. Skin:  Skin is warm, dry and intact. No rash noted. Psychiatric: Mood and affect are normal. Speech and behavior are normal.  ____________________________________________   LABS (all labs ordered are listed, but only abnormal results are displayed)  Labs Reviewed  COMPREHENSIVE METABOLIC PANEL - Abnormal; Notable for the following components:      Result Value   Glucose, Bld 128 (*)    Calcium 8.6 (*)    GFR calc non Af Amer 56 (*)    All other components within normal limits  URINALYSIS, COMPLETE (UACMP) WITH MICROSCOPIC - Abnormal; Notable for the following components:   Color, Urine YELLOW (*)    APPearance CLEAR (*)    Specific Gravity, Urine 1.034 (*)    All other components within normal limits  SARS CORONAVIRUS 2 (TAT 6-24 HRS)  CBC WITH DIFFERENTIAL/PLATELET  LIPASE, BLOOD  TROPONIN I (HIGH SENSITIVITY)  TROPONIN I (HIGH SENSITIVITY)   ____________________________________________  EKG  ED ECG REPORT I, Blake Divine, the attending physician, personally viewed and interpreted this ECG.   Date: 12/07/2019  EKG Time: 13:15  Rate: 75  Rhythm: normal sinus rhythm  Axis: Normal  Intervals:none  ST&T Change: None   PROCEDURES  Procedure(s) performed (including Critical Care):  Procedures   ____________________________________________   INITIAL IMPRESSION /  ASSESSMENT AND PLAN / ED COURSE       79 year old female with history of rheumatoid arthritis, COPD, and chronic pain presents to the ED with a couple nights of diffuse abdominal pain, moving up into her chest this morning. EKG shows no evidence of arrhythmia or ischemia and patient's chest pain has now resolved, I doubt ACS as troponin is within normal limits and symptoms sound atypical.  Remainder of labs are also reassuring, CT abdomen/pelvis is negative for acute process. Her daughter is primarily concerned about episode of speech difficulty earlier this morning that has now resolved. Patient seems to be back to her baseline mental status with no focal neurologic deficits. CT head was performed and negative for acute process. Symptoms are concerning for TIA and case was discussed with hospitalist for admission.      ____________________________________________   FINAL CLINICAL IMPRESSION(S) / ED DIAGNOSES  Final diagnoses:  TIA (transient ischemic attack)  Aphasia     ED Discharge Orders    None       Note:  This document was prepared using Dragon voice recognition software and may include unintentional dictation errors.   Blake Divine, MD 12/07/19 1616

## 2019-12-08 ENCOUNTER — Observation Stay: Payer: Medicare Other

## 2019-12-08 DIAGNOSIS — R4701 Aphasia: Secondary | ICD-10-CM | POA: Diagnosis not present

## 2019-12-08 DIAGNOSIS — G459 Transient cerebral ischemic attack, unspecified: Secondary | ICD-10-CM | POA: Diagnosis not present

## 2019-12-08 LAB — SARS CORONAVIRUS 2 (TAT 6-24 HRS): SARS Coronavirus 2: NEGATIVE

## 2019-12-08 LAB — LIPID PANEL
Cholesterol: 159 mg/dL (ref 0–200)
HDL: 62 mg/dL (ref >40)
LDL Cholesterol: 85 mg/dL (ref 0–99)
Total CHOL/HDL Ratio: 2.6 RATIO
Triglycerides: 60 mg/dL (ref <150)
VLDL: 12 mg/dL (ref 0–40)

## 2019-12-08 LAB — HEMOGLOBIN A1C
Hgb A1c MFr Bld: 5.1 % (ref 4.8–5.6)
Mean Plasma Glucose: 99.67 mg/dL

## 2019-12-08 MED ORDER — BOOST / RESOURCE BREEZE PO LIQD CUSTOM
1.0000 | ORAL | Status: DC
  Administered 2019-12-08: 1 via ORAL

## 2019-12-08 MED ORDER — CHLORDIAZEPOXIDE HCL 10 MG PO CAPS
10.0000 mg | ORAL_CAPSULE | Freq: Every day | ORAL | Status: DC
  Administered 2019-12-08: 10 mg via ORAL
  Filled 2019-12-08: qty 1

## 2019-12-08 MED ORDER — KATE FARMS STANDARD 1.4 PO LIQD: 325.0000 mL | Freq: Every day | ORAL | 12 refills | Status: DC

## 2019-12-08 MED ORDER — NICOTINE 21 MG/24HR TD PT24: 21.0000 mg | MEDICATED_PATCH | Freq: Every day | TRANSDERMAL | 0 refills | Status: DC

## 2019-12-08 MED ORDER — ASPIRIN 81 MG PO TBEC: 81.0000 mg | DELAYED_RELEASE_TABLET | Freq: Every day | ORAL | 0 refills | Status: DC

## 2019-12-08 MED ORDER — ASPIRIN EC 81 MG PO TBEC: 81.0000 mg | DELAYED_RELEASE_TABLET | Freq: Every day | ORAL | Status: DC

## 2019-12-08 MED ORDER — TIZANIDINE HCL 2 MG PO TABS
2.0000 mg | ORAL_TABLET | Freq: Two times a day (BID) | ORAL | Status: DC
  Administered 2019-12-08: 10:00:00 2 mg via ORAL
  Filled 2019-12-08 (×2): qty 1

## 2019-12-08 MED ORDER — ENOXAPARIN SODIUM 30 MG/0.3ML ~~LOC~~ SOLN: 30.0000 mg | SUBCUTANEOUS | Status: DC

## 2019-12-08 MED ORDER — FOLIC ACID 1 MG PO TABS
1.0000 mg | ORAL_TABLET | Freq: Every day | ORAL | Status: DC
  Administered 2019-12-08: 13:00:00 1 mg via ORAL
  Filled 2019-12-08: qty 1

## 2019-12-08 MED ORDER — KATE FARMS STANDARD 1.4 PO LIQD
325.0000 mL | Freq: Every day | ORAL | Status: DC
  Filled 2019-12-08: qty 325

## 2019-12-08 MED ORDER — PRO-STAT SUGAR FREE PO LIQD
30.0000 mL | Freq: Every day | ORAL | Status: DC
  Administered 2019-12-08: 13:00:00 30 mL via ORAL

## 2019-12-08 MED ORDER — ADULT MULTIVITAMIN W/MINERALS CH
1.0000 | ORAL_TABLET | Freq: Every day | ORAL | Status: DC
  Administered 2019-12-08: 13:00:00 1 via ORAL

## 2019-12-08 MED ORDER — BUPROPION HCL ER (XL) 150 MG PO TB24
150.0000 mg | ORAL_TABLET | Freq: Every day | ORAL | Status: DC
  Administered 2019-12-08: 150 mg via ORAL
  Filled 2019-12-08: qty 1

## 2019-12-08 MED ORDER — GALANTAMINE HYDROBROMIDE 4 MG PO TABS
8.0000 mg | ORAL_TABLET | Freq: Two times a day (BID) | ORAL | Status: DC
  Administered 2019-12-08: 8 mg via ORAL
  Filled 2019-12-08 (×2): qty 2

## 2019-12-08 MED ORDER — DULOXETINE HCL 30 MG PO CPEP
60.0000 mg | ORAL_CAPSULE | Freq: Every day | ORAL | Status: DC
  Administered 2019-12-08: 13:00:00 60 mg via ORAL
  Filled 2019-12-08 (×2): qty 2

## 2019-12-08 MED ORDER — ATORVASTATIN CALCIUM 20 MG PO TABS: 20.0000 mg | ORAL_TABLET | Freq: Every day | ORAL | 0 refills | Status: DC

## 2019-12-08 MED ORDER — ADULT MULTIVITAMIN W/MINERALS CH: 1.0000 | ORAL_TABLET | Freq: Every day | ORAL | Status: DC

## 2019-12-08 MED ORDER — ATORVASTATIN CALCIUM 20 MG PO TABS
20.0000 mg | ORAL_TABLET | Freq: Every day | ORAL | Status: DC
  Administered 2019-12-08: 20 mg via ORAL
  Filled 2019-12-08: qty 1

## 2019-12-08 MED ORDER — PRO-STAT SUGAR FREE PO LIQD: 30.0000 mL | Freq: Every day | ORAL | 0 refills | Status: DC

## 2019-12-08 MED ORDER — LEVOTHYROXINE SODIUM 50 MCG PO TABS
50.0000 ug | ORAL_TABLET | Freq: Every day | ORAL | Status: DC
  Administered 2019-12-08: 50 ug via ORAL
  Filled 2019-12-08: qty 1

## 2019-12-08 NOTE — Progress Notes (Signed)
This RN received call from Ernest. Patient had a 7 beat run of SVT at 0712am. Primary RN, Kellie Simmering, notified.

## 2019-12-08 NOTE — Progress Notes (Signed)
SLP Cancellation Note  Patient Details Name: Madalena Kesecker MRN: [id number] DOB: 1941-02-04   Cancelled treatment:       Reason Eval/Treat Not Completed: SLP screened, no needs identified, will sign off (chart reviewed; consulted NSG and then met w/ pt in room). Pt denied any difficulty swallowing and is currently on a regular diet; tolerates swallowing pills w/ water per NSG and meals. Pt conversed at conversational level w/out deficits noted; pt and family denied any current speech-language deficits. Pt c/o difficulty swallowing Large Pills -- discussion and Handout was given on options for swallowing Pills to include breaking in half, crushing as able, and mixing in yogurt, chewables. Encouraged pt to talk w/ her Pharmacist about her medication and options of forms - possibly even a liquid if needed all for ease of swallowing.  No further skilled ST services indicated as pt appears at her baseline. Pt agreed. NSG to reconsult if any new concerns while admitted.     Orinda Kenner, MS, CCC-SLP [facility] Zellars 12/08/2019, 11:27 AM

## 2019-12-08 NOTE — Progress Notes (Deleted)
PROGRESS NOTE   [name]  ESP:[id number]    DOB: [birth year]    DOA: 12/07/2019  PCP: Rusty Aus, MD   I have briefly reviewed patients previous medical records in [facility].  Chief Complaint  Patient presents with  . Abdominal Pain  . Chest Pain  . Nausea  . Otalgia  . Eye Problem    Brief Narrative:   79 year old female, lives alone, usually independent but sustained right patellar fracture and since then has been ambulating with the help of walker, PMH of rheumatoid arthritis, chronic pain, constipation, COPD, ongoing tobacco abuse, gastritis/GERD, hypothyroid, osteopenia/osteoporosis, presented to the [facility] ED on 7/2 with complaints of abdominal pain, dry heaves, lethargy and slurred speech.  Admitted for evaluation and management of abdominal pain possibly due to nonspecific acute viral GE and for possible TIA.  GI symptoms have significantly improved.  TIA symptoms have resolved without recurrence.  PT recommends CIR-consult placed.   Assessment & Plan:  Principal Problem:   TIA (transient ischemic attack) Active Problems:   Chronic pain syndrome   Arthritis, rheumatoid (HCC)   GERD (gastroesophageal reflux disease)   Tobacco abuse   Abdominal pain   COPD (chronic obstructive pulmonary disease) (HCC)    Suspected TIA: CT head without acute findings.    MRI head: No acute intracranial abnormality..  Multiple scattered chronic microhemorrhages.  MRA head: Negative for large vessel occlusion, hemodynamically significant stenosis or other abnormality.  LDL 85 (goal <70). A1C: 5.1. No focal deficits at this time.  2D echo pending.   Dopplers: No hemodynamically significant stenosis.  SLP and OT have no recommendations.  PT recommends CIR due to gait ataxia and fall risk.  CIR consulted.  Discussed with neuro hospitalist on call who reviewed MRI, recommends changing aspirin from 325 to 81 mg daily especially given microhemorrhages, initiate atorvastatin 20 mg  daily for goal LDL.  Abdominal pain and dry heaves: Unclear etiology.  CT abdomen without acute findings.  Possible acute gastroenteritis versus GERD. Spontaneous improvement without specific treatment.    Tolerating diet, continue.  Continue prior home twice daily PPI.  Tobacco abuse: Cessation counseled.  Nicotine patch per request.  COPD: No clinical bronchospasm.  Rheumatoid arthritis/chronic pain and other medical problems: Stable.  No acute flare.  Does not seem to be using methotrexate consistently.  Anxiety and depression: Stable.  Continue prior home bupropion, chlordiazepoxide, duloxetine.  Hypothyroid: Continue Synthroid.  Body mass index is 15.11 kg/m./Underweight: Management per dietitian input  Nutritional Status Nutrition Problem: Inadequate oral intake Etiology: acute illness, nausea, decreased appetite Signs/Symptoms: per patient/family report Interventions: Magic cup, MVI, Other (Comment), Prostat [employer])  DVT prophylaxis: Lovenox Code Status: Full Family Communication: Discussed in detail with patient's daughter, updated care and answered questions. Disposition:  Status is: Observation  The patient will require care spanning > 2 midnights and should be moved to inpatient because: Unsafe d/c plan  Dispo: The patient is from: Home              Anticipated d/c is to: CIR              Anticipated d/c date is: 2 days              Patient currently is not medically stable to d/c.        Consultants:   None  Procedures:   None  Antimicrobials:   None   Subjective:  Patient interviewed and examined along with her female RN at bedside.  No recurrence of slurred speech and no strokelike symptoms.  Tolerated diet without nausea, vomiting or dry heaves.  Reports mild intermittent mid abdominal discomfort but much improved compared to admission.  No other complaints reported.  Objective:   Vitals:   12/08/19 0302 12/08/19 0437 12/08/19 0628  12/08/19 0743  BP: 132/72 130/78 (!) 141/81 131/73  Pulse: 76 73 71 72  Resp: 17 18 16 18   Temp: 98.2 F (36.8 C) 98 F (36.7 C) 98.2 F (36.8 C) 98.2 F (36.8 C)  TempSrc: Oral Oral Oral Oral  SpO2: 99% 100% 97% 98%  Weight:      Height:        General exam: Elderly female, moderately built and frail lying comfortably propped up in bed without distress.  Oral mucosa moist. Respiratory system: Clear to auscultation. Respiratory effort normal. Cardiovascular system: S1 & S2 heard, RRR. No JVD, murmurs, rubs, gallops or clicks. No pedal edema.  Telemetry personally reviewed: Sinus rhythm.  5 beat NS SVT noted. Gastrointestinal system: Abdomen is nondistended, soft and nontender. No organomegaly or masses felt. Normal bowel sounds heard. Central nervous system: Alert and oriented. No focal neurological deficits. Extremities: Symmetric 5 x 5 power. Skin: No rashes, lesions or ulcers Psychiatry: Judgement and insight appear somewhat impaired. Mood & affect appropriate.     Data Reviewed:   I have personally reviewed following labs and imaging studies   CBC: Recent Labs  Lab 12/07/19 1310  WBC 7.0  NEUTROABS 5.7  HGB 12.8  HCT 38.0  MCV 94.3  PLT 676    Basic Metabolic Panel: Recent Labs  Lab 12/07/19 1310  NA 139  K 3.7  CL 101  CO2 27  GLUCOSE 128*  BUN 19  CREATININE 0.97  CALCIUM 8.6*    Liver Function Tests: Recent Labs  Lab 12/07/19 1310  AST 23  ALT 14  ALKPHOS 65  BILITOT 0.6  PROT 7.3  ALBUMIN 4.0    CBG: No results for input(s): GLUCAP in the last 168 hours.  Microbiology Studies:   Recent Results (from the past 240 hour(s))  SARS CORONAVIRUS 2 (TAT 6-24 HRS) Nasopharyngeal Nasopharyngeal Swab     Status: None   Collection Time: 12/07/19  3:49 PM   Specimen: Nasopharyngeal Swab  Result Value Ref Range Status   SARS Coronavirus 2 NEGATIVE NEGATIVE Final    Comment: (NOTE) SARS-CoV-2 target nucleic acids are NOT DETECTED.  The  SARS-CoV-2 RNA is generally detectable in upper and lower respiratory specimens during the acute phase of infection. Negative results do not preclude SARS-CoV-2 infection, do not rule out co-infections with other pathogens, and should not be used as the sole basis for treatment or other patient management decisions. Negative results must be combined with clinical observations, patient history, and epidemiological information. The expected result is Negative.  Fact Sheet for Patients: SugarRoll.be  Fact Sheet for Healthcare Providers: https://www.woods-mathews.com/  This test is not yet approved or cleared by the [place] FDA and  has been authorized for detection and/or diagnosis of SARS-CoV-2 by FDA under an Emergency Use Authorization (EUA). This EUA will remain  in effect (meaning this test can be used) for the duration of the COVID-19 declaration under Se ction 564(b)(1) of the Act, 21 U.S.C. section 360bbb-3(b)(1), unless the authorization is terminated or revoked sooner.  Performed at [facility], [place] [address] [place], [place] [address]      Radiology Studies:  CT Head Wo Contrast  Result Date: 12/07/2019 CLINICAL DATA:  Right eye and ear pain, blurry vision EXAM: CT HEAD WITHOUT CONTRAST TECHNIQUE: Contiguous axial images were obtained from the base of the skull through the vertex without intravenous contrast. COMPARISON:  08/28/2018 FINDINGS: Brain: No evidence of acute infarction, hemorrhage, hydrocephalus, extra-axial collection or mass lesion/mass effect. Periventricular white matter hypodensity. Vascular: No hyperdense vessel or unexpected calcification. Skull: Normal. Negative for fracture or focal lesion. Sinuses/Orbits: No acute finding. Other: None. IMPRESSION: No acute intracranial pathology. Small-vessel white matter disease. Electronically Signed   By: Eddie Candle M.D.   On: 12/07/2019 14:40   MR ANGIO HEAD WO  CONTRAST  Result Date: 12/07/2019 CLINICAL DATA:  Initial evaluation for TIA, slurred speech. EXAM: MRI HEAD WITHOUT CONTRAST MRA HEAD WITHOUT CONTRAST TECHNIQUE: Multiplanar, multiecho pulse sequences of the brain and surrounding structures were obtained without intravenous contrast. Angiographic images of the head were obtained using MRA technique without contrast. COMPARISON:  Comparison made with prior CT from earlier the same day. FINDINGS: MRI HEAD FINDINGS Brain: Diffuse prominence of the CSF containing spaces compatible generalized age-related cerebral atrophy. Patchy and confluent T2/FLAIR hyperintensity within the periventricular deep white matter both cerebral hemispheres most consistent with chronic small vessel ischemic disease, mild in nature. No abnormal foci of restricted diffusion to suggest acute or subacute ischemia. Gray-white matter differentiation maintained. No encephalomalacia to suggest chronic cortical infarction. No evidence for acute intracranial hemorrhage. Multiple scattered punctate foci of susceptibility artifacts seen involving the bilateral cerebral hemispheres, most pronounced at the temporal lobes bilaterally, right greater than left, with a few additional foci seen about the cerebellum. Findings favored to reflect sequelae of chronic underlying hypertension, although cerebral amyloid angiopathy could also be considered. No mass lesion, midline shift or mass effect. No hydrocephalus. Mild diffuse pachymeningeal thickening seen on FLAIR sequence (series 20, image 33), of uncertain etiology or significance. Pituitary gland suprasellar region within normal limits. No findings to suggest CSF hypotension. Midline structures intact and normal. Incidental note made of a 1 cm simple cyst within the pineal gland. Vascular: Major intracranial vascular flow voids are maintained. Increased FLAIR signal intensity noted within the right transverse sinus without associated T1 correlate, likely  reflecting slow flow. Skull and upper cervical spine: Craniocervical junction within normal limits. Bone marrow signal intensity normal. No scalp soft tissue abnormality. Sinuses/Orbits: Patient status post bilateral ocular lens replacement. Mild scattered mucosal thickening noted throughout the ethmoidal air cells. Paranasal sinuses are otherwise clear. Trace bilateral mastoid effusions noted, of doubtful significance. Inner ear structures grossly normal. Other: None. MRA HEAD FINDINGS ANTERIOR CIRCULATION: Visualized distal cervical segments of the internal carotid arteries widely patent with symmetric antegrade flow. Petrous, cavernous, and supraclinoid ICAs are somewhat diffusely dolichoectatic but are widely patent without stenosis or other abnormality. Small 2 mm infundibulum associated with a hypoplastic right posterior communicating artery noted. ICA termini well perfused. A1 segments patent bilaterally. Normal anterior communicating artery complex. Anterior cerebral arteries widely patent to their distal aspects without stenosis. No M1 stenosis or occlusion. Right M1 bifurcates early. Distal MCA branches well perfused and symmetric. POSTERIOR CIRCULATION: Both vertebral arteries patent to the vertebrobasilar junction without stenosis. Left PICA patent. Right PICA not seen. Basilar widely patent to its distal aspect without stenosis. Superior cerebral arteries patent bilaterally. Both PCAs primarily supplied via the basilar well perfused to their distal aspects. No intracranial aneurysm or other vascular abnormality. IMPRESSION: MRI HEAD IMPRESSION: 1. No acute intracranial abnormality. 2. Underlying generalized age-related cerebral atrophy with mild chronic small vessel ischemic disease. 3. Multiple scattered chronic  micro hemorrhages involving the bilateral cerebral hemispheres and cerebellum, nonspecific, but favored to reflect sequelae of chronic poorly controlled hypertension. Cerebral amyloid  angiopathy would be the primary differential consideration. 4. Mild diffuse pachymeningeal thickening, of uncertain significance or etiology, and may be idiopathic in nature. No evidence for CSF hypotension or other structure abnormality to explain this finding. Correlation with history for prior/recent intervention suggested. Additionally, correlation with CSF analysis could be performed for further evaluation as clinically warranted. MRA HEAD IMPRESSION: Negative intracranial MRA. No evidence for large vessel occlusion, hemodynamically significant stenosis, or other abnormality. Electronically Signed   By: Jeannine Boga M.D.   On: 12/07/2019 23:14   MR BRAIN WO CONTRAST  Result Date: 12/07/2019 CLINICAL DATA:  Initial evaluation for TIA, slurred speech. EXAM: MRI HEAD WITHOUT CONTRAST MRA HEAD WITHOUT CONTRAST TECHNIQUE: Multiplanar, multiecho pulse sequences of the brain and surrounding structures were obtained without intravenous contrast. Angiographic images of the head were obtained using MRA technique without contrast. COMPARISON:  Comparison made with prior CT from earlier the same day. FINDINGS: MRI HEAD FINDINGS Brain: Diffuse prominence of the CSF containing spaces compatible generalized age-related cerebral atrophy. Patchy and confluent T2/FLAIR hyperintensity within the periventricular deep white matter both cerebral hemispheres most consistent with chronic small vessel ischemic disease, mild in nature. No abnormal foci of restricted diffusion to suggest acute or subacute ischemia. Gray-white matter differentiation maintained. No encephalomalacia to suggest chronic cortical infarction. No evidence for acute intracranial hemorrhage. Multiple scattered punctate foci of susceptibility artifacts seen involving the bilateral cerebral hemispheres, most pronounced at the temporal lobes bilaterally, right greater than left, with a few additional foci seen about the cerebellum. Findings favored to  reflect sequelae of chronic underlying hypertension, although cerebral amyloid angiopathy could also be considered. No mass lesion, midline shift or mass effect. No hydrocephalus. Mild diffuse pachymeningeal thickening seen on FLAIR sequence (series 20, image 33), of uncertain etiology or significance. Pituitary gland suprasellar region within normal limits. No findings to suggest CSF hypotension. Midline structures intact and normal. Incidental note made of a 1 cm simple cyst within the pineal gland. Vascular: Major intracranial vascular flow voids are maintained. Increased FLAIR signal intensity noted within the right transverse sinus without associated T1 correlate, likely reflecting slow flow. Skull and upper cervical spine: Craniocervical junction within normal limits. Bone marrow signal intensity normal. No scalp soft tissue abnormality. Sinuses/Orbits: Patient status post bilateral ocular lens replacement. Mild scattered mucosal thickening noted throughout the ethmoidal air cells. Paranasal sinuses are otherwise clear. Trace bilateral mastoid effusions noted, of doubtful significance. Inner ear structures grossly normal. Other: None. MRA HEAD FINDINGS ANTERIOR CIRCULATION: Visualized distal cervical segments of the internal carotid arteries widely patent with symmetric antegrade flow. Petrous, cavernous, and supraclinoid ICAs are somewhat diffusely dolichoectatic but are widely patent without stenosis or other abnormality. Small 2 mm infundibulum associated with a hypoplastic right posterior communicating artery noted. ICA termini well perfused. A1 segments patent bilaterally. Normal anterior communicating artery complex. Anterior cerebral arteries widely patent to their distal aspects without stenosis. No M1 stenosis or occlusion. Right M1 bifurcates early. Distal MCA branches well perfused and symmetric. POSTERIOR CIRCULATION: Both vertebral arteries patent to the vertebrobasilar junction without stenosis.  Left PICA patent. Right PICA not seen. Basilar widely patent to its distal aspect without stenosis. Superior cerebral arteries patent bilaterally. Both PCAs primarily supplied via the basilar well perfused to their distal aspects. No intracranial aneurysm or other vascular abnormality. IMPRESSION: MRI HEAD IMPRESSION: 1. No acute intracranial abnormality. 2. Underlying  generalized age-related cerebral atrophy with mild chronic small vessel ischemic disease. 3. Multiple scattered chronic micro hemorrhages involving the bilateral cerebral hemispheres and cerebellum, nonspecific, but favored to reflect sequelae of chronic poorly controlled hypertension. Cerebral amyloid angiopathy would be the primary differential consideration. 4. Mild diffuse pachymeningeal thickening, of uncertain significance or etiology, and may be idiopathic in nature. No evidence for CSF hypotension or other structure abnormality to explain this finding. Correlation with history for prior/recent intervention suggested. Additionally, correlation with CSF analysis could be performed for further evaluation as clinically warranted. MRA HEAD IMPRESSION: Negative intracranial MRA. No evidence for large vessel occlusion, hemodynamically significant stenosis, or other abnormality. Electronically Signed   By: Jeannine Boga M.D.   On: 12/07/2019 23:14   CT Abdomen Pelvis W Contrast  Result Date: 12/07/2019 CLINICAL DATA:  Low abdominal pain for 2 days EXAM: CT ABDOMEN AND PELVIS WITH CONTRAST TECHNIQUE: Multidetector CT imaging of the abdomen and pelvis was performed using the standard protocol following bolus administration of intravenous contrast. CONTRAST:  81mL OMNIPAQUE IOHEXOL 300 MG/ML  SOLN COMPARISON:  02/09/2019 FINDINGS: Lower chest: No acute abnormality. Hepatobiliary: No focal liver abnormality is seen. Status post cholecystectomy. Postoperative biliary dilatation. Pancreas: Unremarkable. No pancreatic ductal dilatation or  surrounding inflammatory changes. Spleen: Normal in size without significant abnormality. Adrenals/Urinary Tract: Adrenal glands are unremarkable. Kidneys are normal, without renal calculi, solid lesion, or hydronephrosis. Bladder is unremarkable. Stomach/Bowel: Stomach is within normal limits. Appendix is not clearly visualized. No evidence of bowel wall thickening, distention, or inflammatory changes. Sigmoid diverticulosis. Vascular/Lymphatic: Aortic atherosclerosis. No enlarged abdominal or pelvic lymph nodes. Reproductive: No mass or other significant abnormality. Other: No abdominal wall hernia or abnormality. No abdominopelvic ascites. Musculoskeletal: No acute or significant osseous findings. IMPRESSION: 1. No acute CT findings of the abdomen or pelvis to explain lower abdominal pain. 2. Sigmoid diverticulosis without evidence of acute diverticulitis. 3. Aortic Atherosclerosis (ICD10-I70.0). Electronically Signed   By: Eddie Candle M.D.   On: 12/07/2019 14:37   US Carotid Bilateral (at [facility] and [facility] only)  Result Date: 12/08/2019 CLINICAL DATA:  79 year old female with a history of TIA EXAM: BILATERAL CAROTID DUPLEX ULTRASOUND TECHNIQUE: Pearline Cables scale imaging, color Doppler and duplex ultrasound were performed of bilateral carotid and vertebral arteries in the neck. COMPARISON:  None. FINDINGS: Criteria: Quantification of carotid stenosis is based on velocity parameters that correlate the residual internal carotid diameter with NASCET-based stenosis levels, using the diameter of the distal internal carotid lumen as the denominator for stenosis measurement. The following velocity measurements were obtained: RIGHT ICA:  Systolic 76 cm/sec, Diastolic 21 cm/sec CCA:  46 cm/sec SYSTOLIC ICA/CCA RATIO:  1.6 ECA:  86 cm/sec LEFT ICA:  Systolic 92 cm/sec, Diastolic 23 cm/sec CCA:  59 cm/sec SYSTOLIC ICA/CCA RATIO:  1.5 ECA:  80 cm/sec Right Brachial SBP: Not acquired Left Brachial SBP: Not acquired RIGHT CAROTID  ARTERY: No significant calcified disease of the right common carotid artery. Intermediate waveform maintained. Heterogeneous plaque without significant calcifications at the right carotid bifurcation. Low resistance waveform of the right ICA. No significant tortuosity. RIGHT VERTEBRAL ARTERY: Antegrade flow with low resistance waveform. LEFT CAROTID ARTERY: No significant calcified disease of the left common carotid artery. Intermediate waveform maintained. Heterogeneous plaque at the left carotid bifurcation without significant calcifications. Low resistance waveform of the left ICA. LEFT VERTEBRAL ARTERY:  Antegrade flow with low resistance waveform. IMPRESSION: Color duplex indicates minimal heterogeneous plaque, with no hemodynamically significant stenosis by duplex criteria in the extracranial cerebrovascular circulation. Signed,  Dulcy Fanny. Dellia Nims, RPVI [facility] Electronically Signed   By: Corrie Mckusick D.O.   On: 12/08/2019 09:12   DG Chest Port 1 View  Result Date: 12/07/2019 CLINICAL DATA:  Chest pain. EXAM: PORTABLE CHEST 1 VIEW COMPARISON:  None. FINDINGS: The lungs are hyperinflated. Mild, diffuse chronic appearing increased lung markings are seen without evidence of an acute infiltrate, pleural effusion or pneumothorax. The heart size and mediastinal contours are within normal limits. The visualized skeletal structures are unremarkable. IMPRESSION: No active disease. Electronically Signed   By: Virgina [place] M.D.   On: 12/07/2019 18:56     Scheduled Meds:   . aspirin  300 mg Rectal Daily   Or  . aspirin EC  325 mg Oral Daily  . buPROPion  150 mg Oral Daily  . chlordiazePOXIDE  10 mg Oral Daily  . DULoxetine  60 mg Oral Daily  . enoxaparin (LOVENOX) injection  30 mg Subcutaneous Q24H  . feeding supplement  1 Container Oral Q24H  . feeding supplement (KATE FARMS STANDARD 1.4)  325 mL Oral Daily  . feeding supplement  (PRO-STAT SUGAR FREE 64)  30 mL Oral Daily  . folic acid  1 mg Oral Daily  . galantamine  8 mg Oral BID  . levothyroxine  50 mcg Oral Daily  . multivitamin with minerals  1 tablet Oral Daily  . nicotine  21 mg Transdermal Daily  . pantoprazole  40 mg Oral Daily  . tiZANidine  2 mg Oral BID    Continuous Infusions:     LOS: 0 days     Vernell Leep, MD, [place], [facility]. [facility]    To contact the attending provider between 7A-7P or the covering provider during after hours 7P-7A, please log into the web site [email or web address] and access using universal [place] password for that web site. If you do not have the password, please call the hospital operator.  12/08/2019, 4:49 PM

## 2019-12-08 NOTE — Discharge Instructions (Signed)

## 2019-12-08 NOTE — Progress Notes (Signed)
Inpatient Rehab Admissions Coordinator Note:   Per PT recommendations, pt was screened for CIR candidacy by Clemens Catholic, [place] CCC-SLP. Pt. Currently walking 300 ft with min assist and OT and SLP are recommending no follow-up. Pt. Is not a candidate for CIR because she does not demonstrate needs in two disciplines and does not adequate demonstrate medical necessity or functional decline to require intensive rehab in an inpatient setting. Recommend continue PT at a lower level of care.   Clemens Catholic, [place], [place] Admissions Coordinator  [phone] ([place]) [phone] (office)

## 2019-12-08 NOTE — Progress Notes (Signed)
Pt was just transported  to Radiology for Ultrasound via bed.

## 2019-12-08 NOTE — Care Management Obs Status (Signed)
[place] NOTIFICATION   Patient Details  Name: [name] MRN: [id number] Date of Birth: 1941-05-22   Medicare Observation Status Notification Given:  Yes    Sherald Barge, RN 12/08/2019, 5:53 PM

## 2019-12-08 NOTE — Progress Notes (Signed)
Discharge paperwork was reviewed with patient and daughter. Copy of Medicare Observation form was provided. Patient and daughter was receptive and conveyed understanding. PIV was removed by another nurse. Nurse transported pt via wheelchair with belongings to daughter's vehicle.

## 2019-12-08 NOTE — Discharge Summary (Signed)
Physician Discharge Summary  [name] PQZ:[id number] DOB: 02-27-1941  PCP: Rusty Aus, MD  Admitted from: Home Discharged to: Home  Admit date: 12/07/2019 Discharge date: 12/08/2019  Recommendations for Outpatient Follow-up:    Follow-up Information    Rusty Aus, MD. Schedule an appointment as soon as possible for a visit in 3 day(s).   Specialty: Internal Medicine Why: Posthospitalization follow-up.  Patient needs to get a 2D echo to complete evaluation of TIA. Contact information: [facility] [place] [address] [phone]                Home Health: PT. Equipment/Devices: None.  Patient has a walker that she uses at home.  Discharge Condition: Improved and stable CODE STATUS: Full Diet recommendation: Heart healthy diet  Discharge Diagnoses:  Principal Problem:   TIA (transient ischemic attack) Active Problems:   Chronic pain syndrome   Arthritis, rheumatoid (HCC)   GERD (gastroesophageal reflux disease)   Tobacco abuse   Abdominal pain   COPD (chronic obstructive pulmonary disease) (HCC)   Brief Summary: 79 year old female, lives alone, usually independent but sustained right patellar fracture and since then has been ambulating with the help of walker, PMH of rheumatoid arthritis, chronic pain, constipation, COPD, ongoing tobacco abuse, gastritis/GERD, hypothyroid, osteopenia/osteoporosis, presented to the [facility] ED on 7/2 with complaints of abdominal pain, dry heaves, lethargy and slurred speech.  Admitted for evaluation and management of abdominal pain possibly due to nonspecific acute viral GE and for suspected TIA.     Assessment & Plan:   Suspected TIA: CT head without acute findings.   MRI head: No acute intracranial abnormality..  Multiple scattered chronic microhemorrhages.  MRA head: Negative for large vessel occlusion, hemodynamically significant stenosis or other abnormality.  LDL 85 (goal <70). A1C:  5.1. No focal deficits at this time.  2D echo pending.  Dopplers: No hemodynamically significant stenosis. Discussed with neuro hospitalist on call who reviewed MRI, recommends changing aspirin from 325 to 81 mg daily especially given microhemorrhages, initiate atorvastatin 20 mg daily for goal LDL.  SLP and OT have no recommendations.  PT recommended CIR but the rehab admissions coordinator indicated that she is not a candidate for CIR and recommended PT at lower level of care.  I discussed in detail with patient's daughter regarding pending echo and outcome of therapy recommendations.  She and patient indicated that they would not prefer to stay in the hospital just to get the echo done and insisted on going home now.  They are agreeable to follow-up with PCP to complete the 2D echo.  Also at this time of the day, [facility] team are not available and may not be able to arrange home health services until after the July 4 weekend holidays and the earliest may be on July 6.  Daughter is aware and agreeable.  Advised her for close family supervision and assistance as needed and daughter indicates that patient is very stubborn and independent and may not cooperate.  I have discussed extensively with nursing to help arrange for home health PT when able.  Abdominal pain and dry heaves: Unclear etiology. CT abdomen without acute findings. Possible acute gastroenteritis versus GERD. Spontaneous improvement without specific treatment.   Tolerating diet, continue.  Continue prior home twice daily PPI.  Tobacco abuse: Cessation counseled. Nicotine patch per request.  COPD: No clinical bronchospasm.  Rheumatoid arthritis/chronic pain and other medical problems: Stable.  No acute flare.  Does not seem to be using  methotrexate consistently.  Anxiety and depression: Stable.  Continue prior home bupropion, chlordiazepoxide, duloxetine.  Hypothyroid: Continue Synthroid.  Body mass index is 15.11  kg/m./Underweight: Management per dietitian input-recommended multiple nutritional supplements as advised by RD which patient may use.  Nutritional Status Nutrition Problem: Inadequate oral intake Etiology: acute illness, nausea, decreased appetite Signs/Symptoms: per patient/family report Interventions: Magic cup, MVI, Other (Comment), Prostat [employer])    Consultants:   None  Procedures:   None   Discharge Instructions  Discharge Instructions    Call MD for:   Complete by: As directed    Recurrent speech difficulties or strokelike symptoms.   Call MD for:  extreme fatigue   Complete by: As directed    Call MD for:  persistant dizziness or light-headedness   Complete by: As directed    Call MD for:  persistant nausea and vomiting   Complete by: As directed    Call MD for:  severe uncontrolled pain   Complete by: As directed    Diet - low sodium heart healthy   Complete by: As directed    Discharge instructions   Complete by: As directed    1) feeding supplement (BOOST / RESOURCE BREEZE) liquid 1 Container, Oral, Every 24 hours.   Increase activity slowly   Complete by: As directed        Medication List    TAKE these medications   aspirin 81 MG EC tablet Take 1 tablet (81 mg total) by mouth daily. Swallow whole. Start taking on: December 09, 2019   atorvastatin 20 MG tablet Commonly known as: LIPITOR Take 1 tablet (20 mg total) by mouth daily at 6 PM.   buPROPion 150 MG 24 hr tablet Commonly known as: WELLBUTRIN XL Take 150 mg by mouth daily.   chlordiazePOXIDE 10 MG capsule Commonly known as: LIBRIUM Take 10 mg by mouth daily.   DULoxetine 60 MG capsule Commonly known as: CYMBALTA Take 60 mg by mouth daily.   feeding supplement (KATE FARMS STANDARD 1.4) Liqd liquid Take 325 mLs by mouth daily.   feeding supplement (PRO-STAT SUGAR FREE 64) Liqd Take 30 mLs by mouth daily. Start taking on: December 08, 345   folic acid 1 MG tablet Commonly known  as: FOLVITE Take 1 tablet by mouth daily.   galantamine 8 MG tablet Commonly known as: RAZADYNE Take 8 mg by mouth 2 (two) times daily.   levothyroxine 50 MCG tablet Commonly known as: SYNTHROID Take 50 mcg by mouth daily.   Methotrexate 25 MG/ML Sosy Inject 20 mg into the skin once a week.   multivitamin with minerals Tabs tablet Take 1 tablet by mouth daily. Start taking on: December 09, 2019   nicotine 21 mg/24hr patch Commonly known as: NICODERM CQ - dosed in mg/24 hours Place 1 patch (21 mg total) onto the skin daily. Start taking on: December 09, 2019   omeprazole 40 MG capsule Commonly known as: PRILOSEC Take 40 mg by mouth in the morning and at bedtime.   tiZANidine 2 MG tablet Commonly known as: ZANAFLEX Take 2 mg by mouth 2 (two) times daily.      Allergies  Allergen Reactions  . Other Other (See Comments)    Uncoded Allergy. Allergen: symmetral Uncoded Allergy. Allergen: etrafon Uncoded Allergy. Allergen: desbutal Uncoded Allergy. Allergen: symmetral Uncoded Allergy. Allergen: etrafon Uncoded Allergy. Allergen: desbutal Other reaction(s): Unknown Uncoded Allergy. Allergen: symmetral Other reaction(s): Unknown Uncoded Allergy. Allergen: etrafon Uncoded Allergy. Allergen: symmetral Uncoded Allergy. Allergen: etrafon Uncoded Allergy.  Allergen: desbutal   . Sulfa Antibiotics     Other reaction(s): Unknown Uncoded Allergy. Allergen: desbutal  . Etrafon [Perphenazine-Amitriptyline]   . Hydrocodone Nausea And Vomiting  . Mirtazapine     Other reaction(s): Hallucination  . Doxycycline Nausea Only  . Percocet [Oxycodone-Acetaminophen] Nausea And Vomiting  . Sertraline Palpitations      Procedures/Studies: CT Head Wo Contrast  Result Date: 12/07/2019 CLINICAL DATA:  Right eye and ear pain, blurry vision EXAM: CT HEAD WITHOUT CONTRAST TECHNIQUE: Contiguous axial images were obtained from the base of the skull through the vertex without intravenous contrast.  COMPARISON:  08/28/2018 FINDINGS: Brain: No evidence of acute infarction, hemorrhage, hydrocephalus, extra-axial collection or mass lesion/mass effect. Periventricular white matter hypodensity. Vascular: No hyperdense vessel or unexpected calcification. Skull: Normal. Negative for fracture or focal lesion. Sinuses/Orbits: No acute finding. Other: None. IMPRESSION: No acute intracranial pathology. Small-vessel white matter disease. Electronically Signed   By: Eddie Candle M.D.   On: 12/07/2019 14:40   MR ANGIO HEAD WO CONTRAST  Result Date: 12/07/2019 CLINICAL DATA:  Initial evaluation for TIA, slurred speech. EXAM: MRI HEAD WITHOUT CONTRAST MRA HEAD WITHOUT CONTRAST TECHNIQUE: Multiplanar, multiecho pulse sequences of the brain and surrounding structures were obtained without intravenous contrast. Angiographic images of the head were obtained using MRA technique without contrast. COMPARISON:  Comparison made with prior CT from earlier the same day. FINDINGS: MRI HEAD FINDINGS Brain: Diffuse prominence of the CSF containing spaces compatible generalized age-related cerebral atrophy. Patchy and confluent T2/FLAIR hyperintensity within the periventricular deep white matter both cerebral hemispheres most consistent with chronic small vessel ischemic disease, mild in nature. No abnormal foci of restricted diffusion to suggest acute or subacute ischemia. Gray-white matter differentiation maintained. No encephalomalacia to suggest chronic cortical infarction. No evidence for acute intracranial hemorrhage. Multiple scattered punctate foci of susceptibility artifacts seen involving the bilateral cerebral hemispheres, most pronounced at the temporal lobes bilaterally, right greater than left, with a few additional foci seen about the cerebellum. Findings favored to reflect sequelae of chronic underlying hypertension, although cerebral amyloid angiopathy could also be considered. No mass lesion, midline shift or mass  effect. No hydrocephalus. Mild diffuse pachymeningeal thickening seen on FLAIR sequence (series 20, image 33), of uncertain etiology or significance. Pituitary gland suprasellar region within normal limits. No findings to suggest CSF hypotension. Midline structures intact and normal. Incidental note made of a 1 cm simple cyst within the pineal gland. Vascular: Major intracranial vascular flow voids are maintained. Increased FLAIR signal intensity noted within the right transverse sinus without associated T1 correlate, likely reflecting slow flow. Skull and upper cervical spine: Craniocervical junction within normal limits. Bone marrow signal intensity normal. No scalp soft tissue abnormality. Sinuses/Orbits: Patient status post bilateral ocular lens replacement. Mild scattered mucosal thickening noted throughout the ethmoidal air cells. Paranasal sinuses are otherwise clear. Trace bilateral mastoid effusions noted, of doubtful significance. Inner ear structures grossly normal. Other: None. MRA HEAD FINDINGS ANTERIOR CIRCULATION: Visualized distal cervical segments of the internal carotid arteries widely patent with symmetric antegrade flow. Petrous, cavernous, and supraclinoid ICAs are somewhat diffusely dolichoectatic but are widely patent without stenosis or other abnormality. Small 2 mm infundibulum associated with a hypoplastic right posterior communicating artery noted. ICA termini well perfused. A1 segments patent bilaterally. Normal anterior communicating artery complex. Anterior cerebral arteries widely patent to their distal aspects without stenosis. No M1 stenosis or occlusion. Right M1 bifurcates early. Distal MCA branches well perfused and symmetric. POSTERIOR CIRCULATION: Both vertebral arteries patent to  the vertebrobasilar junction without stenosis. Left PICA patent. Right PICA not seen. Basilar widely patent to its distal aspect without stenosis. Superior cerebral arteries patent bilaterally. Both  PCAs primarily supplied via the basilar well perfused to their distal aspects. No intracranial aneurysm or other vascular abnormality. IMPRESSION: MRI HEAD IMPRESSION: 1. No acute intracranial abnormality. 2. Underlying generalized age-related cerebral atrophy with mild chronic small vessel ischemic disease. 3. Multiple scattered chronic micro hemorrhages involving the bilateral cerebral hemispheres and cerebellum, nonspecific, but favored to reflect sequelae of chronic poorly controlled hypertension. Cerebral amyloid angiopathy would be the primary differential consideration. 4. Mild diffuse pachymeningeal thickening, of uncertain significance or etiology, and may be idiopathic in nature. No evidence for CSF hypotension or other structure abnormality to explain this finding. Correlation with history for prior/recent intervention suggested. Additionally, correlation with CSF analysis could be performed for further evaluation as clinically warranted. MRA HEAD IMPRESSION: Negative intracranial MRA. No evidence for large vessel occlusion, hemodynamically significant stenosis, or other abnormality. Electronically Signed   By: Jeannine Boga M.D.   On: 12/07/2019 23:14   MR BRAIN WO CONTRAST  Result Date: 12/07/2019 CLINICAL DATA:  Initial evaluation for TIA, slurred speech. EXAM: MRI HEAD WITHOUT CONTRAST MRA HEAD WITHOUT CONTRAST TECHNIQUE: Multiplanar, multiecho pulse sequences of the brain and surrounding structures were obtained without intravenous contrast. Angiographic images of the head were obtained using MRA technique without contrast. COMPARISON:  Comparison made with prior CT from earlier the same day. FINDINGS: MRI HEAD FINDINGS Brain: Diffuse prominence of the CSF containing spaces compatible generalized age-related cerebral atrophy. Patchy and confluent T2/FLAIR hyperintensity within the periventricular deep white matter both cerebral hemispheres most consistent with chronic small vessel ischemic  disease, mild in nature. No abnormal foci of restricted diffusion to suggest acute or subacute ischemia. Gray-white matter differentiation maintained. No encephalomalacia to suggest chronic cortical infarction. No evidence for acute intracranial hemorrhage. Multiple scattered punctate foci of susceptibility artifacts seen involving the bilateral cerebral hemispheres, most pronounced at the temporal lobes bilaterally, right greater than left, with a few additional foci seen about the cerebellum. Findings favored to reflect sequelae of chronic underlying hypertension, although cerebral amyloid angiopathy could also be considered. No mass lesion, midline shift or mass effect. No hydrocephalus. Mild diffuse pachymeningeal thickening seen on FLAIR sequence (series 20, image 33), of uncertain etiology or significance. Pituitary gland suprasellar region within normal limits. No findings to suggest CSF hypotension. Midline structures intact and normal. Incidental note made of a 1 cm simple cyst within the pineal gland. Vascular: Major intracranial vascular flow voids are maintained. Increased FLAIR signal intensity noted within the right transverse sinus without associated T1 correlate, likely reflecting slow flow. Skull and upper cervical spine: Craniocervical junction within normal limits. Bone marrow signal intensity normal. No scalp soft tissue abnormality. Sinuses/Orbits: Patient status post bilateral ocular lens replacement. Mild scattered mucosal thickening noted throughout the ethmoidal air cells. Paranasal sinuses are otherwise clear. Trace bilateral mastoid effusions noted, of doubtful significance. Inner ear structures grossly normal. Other: None. MRA HEAD FINDINGS ANTERIOR CIRCULATION: Visualized distal cervical segments of the internal carotid arteries widely patent with symmetric antegrade flow. Petrous, cavernous, and supraclinoid ICAs are somewhat diffusely dolichoectatic but are widely patent without  stenosis or other abnormality. Small 2 mm infundibulum associated with a hypoplastic right posterior communicating artery noted. ICA termini well perfused. A1 segments patent bilaterally. Normal anterior communicating artery complex. Anterior cerebral arteries widely patent to their distal aspects without stenosis. No M1 stenosis or occlusion. Right M1 bifurcates  early. Distal MCA branches well perfused and symmetric. POSTERIOR CIRCULATION: Both vertebral arteries patent to the vertebrobasilar junction without stenosis. Left PICA patent. Right PICA not seen. Basilar widely patent to its distal aspect without stenosis. Superior cerebral arteries patent bilaterally. Both PCAs primarily supplied via the basilar well perfused to their distal aspects. No intracranial aneurysm or other vascular abnormality. IMPRESSION: MRI HEAD IMPRESSION: 1. No acute intracranial abnormality. 2. Underlying generalized age-related cerebral atrophy with mild chronic small vessel ischemic disease. 3. Multiple scattered chronic micro hemorrhages involving the bilateral cerebral hemispheres and cerebellum, nonspecific, but favored to reflect sequelae of chronic poorly controlled hypertension. Cerebral amyloid angiopathy would be the primary differential consideration. 4. Mild diffuse pachymeningeal thickening, of uncertain significance or etiology, and may be idiopathic in nature. No evidence for CSF hypotension or other structure abnormality to explain this finding. Correlation with history for prior/recent intervention suggested. Additionally, correlation with CSF analysis could be performed for further evaluation as clinically warranted. MRA HEAD IMPRESSION: Negative intracranial MRA. No evidence for large vessel occlusion, hemodynamically significant stenosis, or other abnormality. Electronically Signed   By: Jeannine Boga M.D.   On: 12/07/2019 23:14   CT Abdomen Pelvis W Contrast  Result Date: 12/07/2019 CLINICAL DATA:  Low  abdominal pain for 2 days EXAM: CT ABDOMEN AND PELVIS WITH CONTRAST TECHNIQUE: Multidetector CT imaging of the abdomen and pelvis was performed using the standard protocol following bolus administration of intravenous contrast. CONTRAST:  8mL OMNIPAQUE IOHEXOL 300 MG/ML  SOLN COMPARISON:  02/09/2019 FINDINGS: Lower chest: No acute abnormality. Hepatobiliary: No focal liver abnormality is seen. Status post cholecystectomy. Postoperative biliary dilatation. Pancreas: Unremarkable. No pancreatic ductal dilatation or surrounding inflammatory changes. Spleen: Normal in size without significant abnormality. Adrenals/Urinary Tract: Adrenal glands are unremarkable. Kidneys are normal, without renal calculi, solid lesion, or hydronephrosis. Bladder is unremarkable. Stomach/Bowel: Stomach is within normal limits. Appendix is not clearly visualized. No evidence of bowel wall thickening, distention, or inflammatory changes. Sigmoid diverticulosis. Vascular/Lymphatic: Aortic atherosclerosis. No enlarged abdominal or pelvic lymph nodes. Reproductive: No mass or other significant abnormality. Other: No abdominal wall hernia or abnormality. No abdominopelvic ascites. Musculoskeletal: No acute or significant osseous findings. IMPRESSION: 1. No acute CT findings of the abdomen or pelvis to explain lower abdominal pain. 2. Sigmoid diverticulosis without evidence of acute diverticulitis. 3. Aortic Atherosclerosis (ICD10-I70.0). Electronically Signed   By: Eddie Candle M.D.   On: 12/07/2019 14:37   US Carotid Bilateral (at [facility] and [facility] only)  Result Date: 12/08/2019 CLINICAL DATA:  79 year old female with a history of TIA EXAM: BILATERAL CAROTID DUPLEX ULTRASOUND TECHNIQUE: Pearline Cables scale imaging, color Doppler and duplex ultrasound were performed of bilateral carotid and vertebral arteries in the neck. COMPARISON:  None. FINDINGS: Criteria: Quantification of carotid stenosis is based on velocity parameters that correlate the residual  internal carotid diameter with NASCET-based stenosis levels, using the diameter of the distal internal carotid lumen as the denominator for stenosis measurement. The following velocity measurements were obtained: RIGHT ICA:  Systolic 76 cm/sec, Diastolic 21 cm/sec CCA:  46 cm/sec SYSTOLIC ICA/CCA RATIO:  1.6 ECA:  86 cm/sec LEFT ICA:  Systolic 92 cm/sec, Diastolic 23 cm/sec CCA:  59 cm/sec SYSTOLIC ICA/CCA RATIO:  1.5 ECA:  80 cm/sec Right Brachial SBP: Not acquired Left Brachial SBP: Not acquired RIGHT CAROTID ARTERY: No significant calcified disease of the right common carotid artery. Intermediate waveform maintained. Heterogeneous plaque without significant calcifications at the right carotid bifurcation. Low resistance waveform of the right ICA. No significant tortuosity. RIGHT  VERTEBRAL ARTERY: Antegrade flow with low resistance waveform. LEFT CAROTID ARTERY: No significant calcified disease of the left common carotid artery. Intermediate waveform maintained. Heterogeneous plaque at the left carotid bifurcation without significant calcifications. Low resistance waveform of the left ICA. LEFT VERTEBRAL ARTERY:  Antegrade flow with low resistance waveform. IMPRESSION: Color duplex indicates minimal heterogeneous plaque, with no hemodynamically significant stenosis by duplex criteria in the extracranial cerebrovascular circulation. Signed, Dulcy Fanny. Dellia Nims, RPVI [facility] Electronically Signed   By: Corrie Mckusick D.O.   On: 12/08/2019 09:12   DG Chest Port 1 View  Result Date: 12/07/2019 CLINICAL DATA:  Chest pain. EXAM: PORTABLE CHEST 1 VIEW COMPARISON:  None. FINDINGS: The lungs are hyperinflated. Mild, diffuse chronic appearing increased lung markings are seen without evidence of an acute infiltrate, pleural effusion or pneumothorax. The heart size and mediastinal contours are within normal limits. The visualized skeletal structures are  unremarkable. IMPRESSION: No active disease. Electronically Signed   By: Virgina Norfolk M.D.   On: 12/07/2019 18:56      Subjective: Abdominal pain resolved.  Tolerating diet without nausea vomiting.  No recurrence of slurred speech or any strokelike symptoms.  Discharge Exam:  Vitals:   12/08/19 0437 12/08/19 0628 12/08/19 0743 12/08/19 1701  BP: 130/78 (!) 141/81 131/73 129/79  Pulse: 73 71 72 79  Resp: 18 16 18 17   Temp: 98 F (36.7 C) 98.2 F (36.8 C) 98.2 F (36.8 C) 98 F (36.7 C)  TempSrc: Oral Oral Oral   SpO2: 100% 97% 98% 100%  Weight:      Height:        General exam: Elderly female, moderately built and frail lying comfortably propped up in bed without distress.  Oral mucosa moist. Respiratory system: Clear to auscultation. Respiratory effort normal. Cardiovascular system: S1 & S2 heard, RRR. No JVD, murmurs, rubs, gallops or clicks. No pedal edema.  Telemetry personally reviewed: Sinus rhythm.  5 beat NS SVT noted. Gastrointestinal system: Abdomen is nondistended, soft and nontender. No organomegaly or masses felt. Normal bowel sounds heard. Central nervous system: Alert and oriented. No focal neurological deficits. Extremities: Symmetric 5 x 5 power. Skin: No rashes, lesions or ulcers Psychiatry: Judgement and insight appear somewhat impaired. Mood & affect appropriate.     The results of significant diagnostics from this hospitalization (including imaging, microbiology, ancillary and laboratory) are listed below for reference.     Microbiology: Recent Results (from the past 240 hour(s))  SARS CORONAVIRUS 2 (TAT 6-24 HRS) Nasopharyngeal Nasopharyngeal Swab     Status: None   Collection Time: 12/07/19  3:49 PM   Specimen: Nasopharyngeal Swab  Result Value Ref Range Status   SARS Coronavirus 2 NEGATIVE NEGATIVE Final    Comment: (NOTE) SARS-CoV-2 target nucleic acids are NOT DETECTED.  The SARS-CoV-2 RNA is generally detectable in upper and  lower respiratory specimens during the acute phase of infection. Negative results do not preclude SARS-CoV-2 infection, do not rule out co-infections with other pathogens, and should not be used as the sole basis for treatment or other patient management decisions. Negative results must be combined with clinical observations, patient history, and epidemiological information. The expected result is Negative.  Fact Sheet for Patients: SugarRoll.be  Fact Sheet for Healthcare Providers: https://www.woods-mathews.com/  This test is not yet approved or cleared by the [place] FDA and  has been authorized for detection and/or diagnosis of SARS-CoV-2 by FDA under an Emergency Use Authorization (EUA). This EUA will  remain  in effect (meaning this test can be used) for the duration of the COVID-19 declaration under Se ction 564(b)(1) of the Act, 21 U.S.C. section 360bbb-3(b)(1), unless the authorization is terminated or revoked sooner.  Performed at [facility], [place] [address], [place], [place] [address]      Labs: CBC: Recent Labs  Lab 12/07/19 1310  WBC 7.0  NEUTROABS 5.7  HGB 12.8  HCT 38.0  MCV 94.3  PLT 443    Basic Metabolic Panel: Recent Labs  Lab 12/07/19 1310  NA 139  K 3.7  CL 101  CO2 27  GLUCOSE 128*  BUN 19  CREATININE 0.97  CALCIUM 8.6*    Liver Function Tests: Recent Labs  Lab 12/07/19 1310  AST 23  ALT 14  ALKPHOS 65  BILITOT 0.6  PROT 7.3  ALBUMIN 4.0    CBG: No results for input(s): GLUCAP in the last 168 hours.  Hgb A1c Recent Labs    12/08/19 0516  HGBA1C 5.1    Lipid Profile Recent Labs    12/08/19 0516  CHOL 159  HDL 62  LDLCALC 85  TRIG 60  CHOLHDL 2.6    Thyroid function studies No results for input(s): TSH, T4TOTAL, T3FREE, THYROIDAB in the last 72 hours.  Invalid input(s): FREET3  Anemia work up No results for input(s): VITAMINB12, FOLATE, FERRITIN, TIBC,  IRON, RETICCTPCT in the last 72 hours.  Urinalysis    Component Value Date/Time   COLORURINE YELLOW (A) 12/07/2019 1508   APPEARANCEUR CLEAR (A) 12/07/2019 1508   LABSPEC 1.034 (H) 12/07/2019 1508   PHURINE 7.0 12/07/2019 1508   GLUCOSEU NEGATIVE 12/07/2019 1508   HGBUR NEGATIVE 12/07/2019 1508   BILIRUBINUR NEGATIVE 12/07/2019 1508   KETONESUR NEGATIVE 12/07/2019 1508   PROTEINUR NEGATIVE 12/07/2019 1508   UROBILINOGEN 0.2 03/06/2007 2115   NITRITE NEGATIVE 12/07/2019 1508   LEUKOCYTESUR NEGATIVE 12/07/2019 1508    I discussed in detail with patient's daughter, updated care and answered questions.  Time coordinating discharge: 25 minutes  SIGNED:  Vernell Leep, MD, [place], [facility]  To contact the attending provider between 7A-7P or the covering provider during after hours 7P-7A, please log into the web site [email or web address] and access using universal [place] password for that web site. If you do not have the password, please call the hospital operator.

## 2019-12-08 NOTE — Evaluation (Signed)
Occupational Therapy Evaluation Patient Details Name: [name] MRN: [id number] DOB: [birth year] Today's Date: 12/08/2019    History of Present Illness Pt. is a 79 y.o. female who was admitted to [facility] with slurred speech. Imaging revealed No acute intracranial abnormality, age related atrophy, scattered microhemorrhages in cerebral hemisphere,a nd cerebellum. PMHx includes: Recent Patellar Fx, RA, Chronic pain, constipation, COPD, Tobacco Abuse, GERD, Hypothyroid, Ostepenia/osteporosis.   Clinical Impression   Pt. resides at home alone, and has family available close by. Pt. was independent with ADLs, and IADL functioning: including meal preparation, and medication management. Pt. was able to drive, and performed her own shopping. Pt. was receiving PT services at home following a recent patellar fracture.  Pt. education was provided about joint protection principals for RA for ADLs, and IADLs. No further OT services  are warranted at this time, and no follow OT services are indicated upon return home. Pt. Is in agreement.     Follow Up Recommendations  No OT follow up    Equipment Recommendations       Recommendations for Other Services       Precautions / Restrictions        Mobility Bed Mobility Overal bed mobility: Independent                Transfers Overall transfer level: Modified independent Equipment used: Rolling walker (2 wheeled)                  Balance                                           ADL either performed or assessed with clinical judgement   ADL Overall ADL's : Independent                                             Vision Baseline Vision/History: Wears glasses Patient Visual Report: No change from baseline       Perception     Praxis      Pertinent Vitals/Pain Pain Assessment: No/denies pain     Hand Dominance Right   Extremity/Trunk Assessment Upper Extremity  Assessment Upper Extremity Assessment: Overall WFL for tasks assessed           Communication     Cognition Arousal/Alertness: Awake/alert Behavior During Therapy: WFL for tasks assessed/performed Overall Cognitive Status: Within Functional Limits for tasks assessed                                     General Comments       Exercises     Shoulder Instructions      Home Living Family/patient expects to be discharged to:: Private residence Living Arrangements: Alone Available Help at Discharge: Family Type of Home: House Home Access: Stairs to enter Technical brewer of Steps: 4   Home Layout: One level     Bathroom Shower/Tub: Walk-in shower;Door         Home Equipment: Shower seat - built in;Walker - 2 wheels   Additional Comments: Raised commode      Prior Functioning/Environment Level of Independence: Independent  OT Problem List:        OT Treatment/Interventions:      OT Goals(Current goals can be found in the care plan section) Acute Rehab OT Goals Patient Stated Goal: To return home OT Goal Formulation: With patient Potential to Achieve Goals: Good  OT Frequency:     Barriers to D/C:            Co-evaluation              AM-PAC OT "6 Clicks" Daily Activity     Outcome Measure Help from another person eating meals?: None Help from another person taking care of personal grooming?: None Help from another person toileting, which includes using toliet, bedpan, or urinal?: None Help from another person bathing (including washing, rinsing, drying)?: None Help from another person to put on and taking off regular upper body clothing?: None Help from another person to put on and taking off regular lower body clothing?: None 6 Click Score: 24   End of Session Equipment Utilized During Treatment: Gait belt  Activity Tolerance: Patient tolerated treatment well Patient left: in bed  OT Visit  Diagnosis: Muscle weakness (generalized) (M62.81)                Time: 9672-8979 OT Time Calculation (min): 15 min Charges:  OT General Charges $OT Visit: 1 Visit OT Evaluation $OT Eval Low Complexity: 1 Low  Harrel Carina, MS, OTR/L   Harrel Carina 12/08/2019, 1:43 PM

## 2019-12-08 NOTE — Evaluation (Signed)
Physical Therapy Evaluation Patient Details Name: [name] MRN: [id number] DOB: [birth year] Today's Date: 12/08/2019   History of Present Illness  Pt. is a 79 y.o. female who was admitted to [facility] with slurred speech. Imaging revealed No acute intracranial abnormality, age related atrophy, scattered microhemorrhages in cerebral hemisphere,a nd cerebellum. PMHx includes: Recent Patellar Fx, RA, Chronic pain, constipation, COPD, Tobacco Abuse, GERD, Hypothyroid, Ostepenia/osteporosis.  Clinical Impression  Pt was seen for mobility on RW, with continual assist needed to control balance and to avoid a shift to run into the wall.  Has been home alone, has sustained injurious falls in the past by being out in her yard without an AD.  Pt is appropriate for a very short stay to work on balance and awareness of her environment, and to decrease falls with consideration for educating about AD and safety.  Her daughter is supportive, and pt is considering this.  Pt is not sure she needs rehab, does not connect the falls and fractures to her balance being different.  Reported her knee injury was sustained pulling a wild onion in the yard.  Follow acutely to work on safety with gait since pt may decline to go to rehab setting from [place].    Follow Up Recommendations CIR    Equipment Recommendations  None recommended by PT    Recommendations for Other Services Rehab consult     Precautions / Restrictions Precautions Precautions: Fall Precaution Comments: L side visual field reduction Restrictions Weight Bearing Restrictions: No      Mobility  Bed Mobility Overal bed mobility: Modified Independent                Transfers Overall transfer level: Modified independent Equipment used: Rolling walker (2 wheeled)             General transfer comment: contact for balance   Ambulation/Gait Ambulation/Gait assistance: Min assist Gait Distance (Feet): 300 Feet Assistive device:  Rolling walker (2 wheeled);1 person hand held assist   Gait velocity: controlled Gait velocity interpretation: <1.31 ft/sec, indicative of household ambulator General Gait Details: pt can walk with reciprocal gait but walker is continually drifting to L, requiring significant help to keep from running into the wall  Stairs            Wheelchair Mobility    Modified Rankin (Stroke Patients Only)       Balance Overall balance assessment: History of Falls;Needs assistance Sitting-balance support: Feet supported Sitting balance-Leahy Scale: Fair     Standing balance support: Bilateral upper extremity supported;During functional activity Standing balance-Leahy Scale: Fair Standing balance comment: less than fair with gait                             Pertinent Vitals/Pain Pain Assessment: No/denies pain    Home Living Family/patient expects to be discharged to:: Private residence Living Arrangements: Alone Available Help at Discharge: Family Type of Home: House Home Access: Stairs to enter   Technical brewer of Steps: 4 Home Layout: One level Home Equipment: Shower seat - built in;Walker - 2 wheels Additional Comments: Raised commode    Prior Function Level of Independence: Independent         Comments: has not been using her AD's per daughter, many falls with some injuries     Hand Dominance   Dominant Hand: Right    Extremity/Trunk Assessment   Upper Extremity Assessment Upper Extremity Assessment: Defer to OT evaluation  Lower Extremity Assessment Lower Extremity Assessment:  (strength WFL on LE's but coordination loss on LLE)    Cervical / Trunk Assessment Cervical / Trunk Assessment: Kyphotic  Communication   Communication: No difficulties  Cognition Arousal/Alertness: Awake/alert Behavior During Therapy: WFL for tasks assessed/performed Overall Cognitive Status: Within Functional Limits for tasks assessed                                         General Comments General comments (skin integrity, edema, etc.): Pt has a combined coordination loss on LLE along with vision loss of L side, reducing L side awareness    Exercises     Assessment/Plan    PT Assessment Patient needs continued PT services  PT Problem List Decreased activity tolerance;Decreased balance;Decreased coordination;Decreased safety awareness       PT Treatment Interventions DME instruction;Gait training;Stair training;Functional mobility training;Therapeutic activities;Therapeutic exercise;Balance training;Neuromuscular re-education;Patient/family education    PT Goals (Current goals can be found in the Care Plan section)  Acute Rehab PT Goals Patient Stated Goal: To return home PT Goal Formulation: With patient/family Time For Goal Achievement: 12/22/19 Potential to Achieve Goals: Good    Frequency Min 2X/week   Barriers to discharge Inaccessible home environment;Decreased caregiver support home alone, family working    Co-evaluation               AM-PAC PT "6 Clicks" Mobility  Outcome Measure Help needed turning from your back to your side while in a flat bed without using bedrails?: None Help needed moving from lying on your back to sitting on the side of a flat bed without using bedrails?: A Little Help needed moving to and from a bed to a chair (including a wheelchair)?: A Little Help needed standing up from a chair using your arms (e.g., wheelchair or bedside chair)?: A Little Help needed to walk in hospital room?: A Little Help needed climbing 3-5 steps with a railing? : A Lot 6 Click Score: 18    End of Session Equipment Utilized During Treatment: Gait belt Activity Tolerance: Treatment limited secondary to medical complications (Comment) Patient left: in bed;with call bell/phone within reach;with bed alarm set;with family/visitor present Nurse Communication: Mobility status PT Visit  Diagnosis: Unsteadiness on feet (R26.81);Repeated falls (R29.6);Difficulty in walking, not elsewhere classified (R26.2)    Time: 9767-3419 PT Time Calculation (min) (ACUTE ONLY): 27 min   Charges:   PT Evaluation $PT Eval Moderate Complexity: 1 Mod PT Treatments $Gait Training: 8-22 mins       Ramond Dial 12/08/2019, 4:26 PM  Mee Hives, PT MS Acute Rehab Dept. Number: [place]

## 2019-12-08 NOTE — Care Management CC44 (Signed)
Condition Code 44 Documentation Completed  Patient Details  Name: [name] MRN: [id number] Date of Birth: 1941/03/02   Condition Code 44 given:  Yes Patient signature on Condition Code 44 notice:  Yes Documentation of 2 MD's agreement:  Yes Code 44 added to claim:  Yes    Sherald Barge, RN 12/08/2019, 5:53 PM

## 2019-12-08 NOTE — Progress Notes (Signed)
Initial Nutrition Assessment  RD working remotely.  DOCUMENTATION CODES:   Underweight  INTERVENTION:  - will order Boost Breeze once/day, each supplement provides 250 kcal and 9 grams of protein. - will order Dillard [place] 1.4 orally once/day, each supplement provides 455 kcal and 20 grams protein. - will order 30 mL Prostat BID, each supplement provides 100 kcal and 15 grams of protein. - will order 1 tablet multivitamin with minerals/day. - will complete NFPE at follow-up.    NUTRITION DIAGNOSIS:   Inadequate oral intake related to acute illness, nausea, decreased appetite as evidenced by per patient/family report.  GOAL:   Patient will meet greater than or equal to 90% of their needs  MONITOR:   PO intake, Supplement acceptance, Labs, Weight trends  REASON FOR ASSESSMENT:   Malnutrition Screening Tool  ASSESSMENT:   79 year old female who lives alone. She has medical history of rheumatoid arthritis, chronic pain, constipation, COPD, ongoing tobacco abuse, gastritis/GERD, hypothyroid, osteopenia/osteoporosis. She has been using a walker to ambulate since sustaining a R patellar fx. She presented to the ED on 7/2 due to abdominal pain with nausea and dry heaving without vomiting.  No intakes documented since admission. Patient lives alone and obtains groceries for herself and prepares her own meals. Since fx, she has been using a walker to ambulate. No overt discomfort with ambulation but it does take her longer to do tasks than it used to and she tires much more quickly. Patient usually eats something small for breakfast and lunch and dinner is highly variable.   Due to abdominal pain, nausea, and dry heaving, she has eaten very little in the few days PTA but symptoms have now mainly resolved.   Suspect patient meets criteria for some degree of malnutrition, but unable to confirm without NFPE.   Weight yesterday was 88 lb and PTA the most recently documented weight was on  02/21/18 when she weighed 91 lb.    Labs reviewed; Ca: 8.6 mg/dl, GFR: 56 ml/min. Medications reviewed; 1 mg folvite/day, 50 mcg oral synthroid/day.    NUTRITION - FOCUSED PHYSICAL EXAM:  unable to complete at this time.   Diet Order:   Diet Order            Diet Heart Room service appropriate? Yes; Fluid consistency: Thin  Diet effective now                 EDUCATION NEEDS:   No education needs have been identified at this time  Skin:  Skin Assessment: Reviewed RN Assessment  Last BM:  7/2  Height:   Ht Readings from Last 1 Encounters:  12/07/19 5\' 4"  (1.626 m)    Weight:   Wt Readings from Last 1 Encounters:  12/07/19 39.9 kg     Estimated Nutritional Needs:  Kcal:  1500-1700 kcal Protein:  60-70 grams Fluid:  >/= 1.6 L/day     Pamela Matin, MS, RD, LDN, CNSC Inpatient Clinical Dietitian RD pager # available in [facility]  After hours/weekend pager # available in [facility]

## 2020-02-19 ENCOUNTER — Other Ambulatory Visit: Payer: Self-pay | Admitting: Internal Medicine

## 2020-02-19 DIAGNOSIS — Z1231 Encounter for screening mammogram for malignant neoplasm of breast: Secondary | ICD-10-CM

## 2020-02-26 ENCOUNTER — Other Ambulatory Visit: Payer: Self-pay | Admitting: Physician Assistant

## 2020-02-26 DIAGNOSIS — H9209 Otalgia, unspecified ear: Secondary | ICD-10-CM

## 2020-03-10 ENCOUNTER — Other Ambulatory Visit: Payer: Self-pay

## 2020-03-10 ENCOUNTER — Ambulatory Visit
Admission: RE | Admit: 2020-03-10 | Discharge: 2020-03-10 | Disposition: A | Discharge status: Home / Self Care | Payer: Medicare Other | Source: Ambulatory Visit | Attending: Physician Assistant | Admitting: Physician Assistant

## 2020-03-10 DIAGNOSIS — H9209 Otalgia, unspecified ear: Secondary | ICD-10-CM | POA: Diagnosis present

## 2020-03-10 LAB — POCT I-STAT CREATININE: Creatinine, Ser: 1.3 mg/dL — ABNORMAL HIGH (ref 0.44–1.00)

## 2020-03-10 MED ORDER — IOHEXOL 300 MG/ML  SOLN
75.0000 mL | Freq: Once | INTRAMUSCULAR | Status: AC | PRN
  Administered 2020-03-10: 75 mL via INTRAVENOUS

## 2020-03-11 ENCOUNTER — Ambulatory Visit
Admission: RE | Admit: 2020-03-11 | Discharge: 2020-03-11 | Disposition: A | Discharge status: Home / Self Care | Payer: Medicare Other | Source: Ambulatory Visit | Attending: Internal Medicine | Admitting: Internal Medicine

## 2020-03-11 DIAGNOSIS — Z1231 Encounter for screening mammogram for malignant neoplasm of breast: Secondary | ICD-10-CM | POA: Diagnosis present

## 2020-10-02 ENCOUNTER — Other Ambulatory Visit: Payer: Self-pay | Admitting: Family Medicine

## 2020-10-02 ENCOUNTER — Ambulatory Visit
Admission: RE | Admit: 2020-10-02 | Discharge: 2020-10-02 | Disposition: A | Discharge status: Home / Self Care | Payer: Medicare Other | Source: Ambulatory Visit | Attending: Family Medicine | Admitting: Family Medicine

## 2020-10-02 ENCOUNTER — Other Ambulatory Visit: Payer: Self-pay

## 2020-10-02 DIAGNOSIS — R42 Dizziness and giddiness: Secondary | ICD-10-CM | POA: Diagnosis not present

## 2020-10-02 DIAGNOSIS — R519 Headache, unspecified: Secondary | ICD-10-CM | POA: Insufficient documentation

## 2021-02-02 ENCOUNTER — Other Ambulatory Visit: Payer: Self-pay | Admitting: Internal Medicine

## 2021-02-02 DIAGNOSIS — Z1231 Encounter for screening mammogram for malignant neoplasm of breast: Secondary | ICD-10-CM

## 2021-03-26 ENCOUNTER — Ambulatory Visit
Admission: RE | Admit: 2021-03-26 | Discharge: 2021-03-26 | Disposition: A | Discharge status: Home / Self Care | Payer: Medicare Other | Source: Ambulatory Visit | Attending: Internal Medicine | Admitting: Internal Medicine

## 2021-03-26 ENCOUNTER — Other Ambulatory Visit: Payer: Self-pay

## 2021-03-26 DIAGNOSIS — Z1231 Encounter for screening mammogram for malignant neoplasm of breast: Secondary | ICD-10-CM | POA: Diagnosis present

## 2021-06-03 ENCOUNTER — Other Ambulatory Visit: Payer: Self-pay | Admitting: Internal Medicine

## 2021-06-03 DIAGNOSIS — R634 Abnormal weight loss: Secondary | ICD-10-CM

## 2021-06-03 DIAGNOSIS — R0609 Other forms of dyspnea: Secondary | ICD-10-CM

## 2021-06-09 ENCOUNTER — Ambulatory Visit
Admission: RE | Admit: 2021-06-09 | Discharge: 2021-06-09 | Disposition: A | Discharge status: Home / Self Care | Payer: Medicare Other | Source: Ambulatory Visit | Attending: Internal Medicine | Admitting: Internal Medicine

## 2021-06-09 ENCOUNTER — Other Ambulatory Visit: Payer: Self-pay

## 2021-06-09 DIAGNOSIS — R0609 Other forms of dyspnea: Secondary | ICD-10-CM | POA: Insufficient documentation

## 2021-06-09 DIAGNOSIS — R634 Abnormal weight loss: Secondary | ICD-10-CM | POA: Insufficient documentation

## 2021-10-11 ENCOUNTER — Observation Stay
Admission: EM | Admit: 2021-10-11 | Discharge: 2021-10-12 | Disposition: A | Discharge status: Home / Self Care | Payer: Medicare Other | Attending: Internal Medicine | Admitting: Internal Medicine

## 2021-10-11 ENCOUNTER — Emergency Department: Payer: Medicare Other

## 2021-10-11 ENCOUNTER — Other Ambulatory Visit: Payer: Self-pay

## 2021-10-11 DIAGNOSIS — N179 Acute kidney failure, unspecified: Secondary | ICD-10-CM | POA: Diagnosis not present

## 2021-10-11 DIAGNOSIS — R112 Nausea with vomiting, unspecified: Principal | ICD-10-CM | POA: Insufficient documentation

## 2021-10-11 DIAGNOSIS — K219 Gastro-esophageal reflux disease without esophagitis: Secondary | ICD-10-CM | POA: Diagnosis present

## 2021-10-11 DIAGNOSIS — K529 Noninfective gastroenteritis and colitis, unspecified: Secondary | ICD-10-CM | POA: Insufficient documentation

## 2021-10-11 DIAGNOSIS — S161XXA Strain of muscle, fascia and tendon at neck level, initial encounter: Secondary | ICD-10-CM | POA: Insufficient documentation

## 2021-10-11 DIAGNOSIS — X58XXXA Exposure to other specified factors, initial encounter: Secondary | ICD-10-CM | POA: Diagnosis not present

## 2021-10-11 DIAGNOSIS — E876 Hypokalemia: Secondary | ICD-10-CM | POA: Diagnosis not present

## 2021-10-11 DIAGNOSIS — F17210 Nicotine dependence, cigarettes, uncomplicated: Secondary | ICD-10-CM | POA: Insufficient documentation

## 2021-10-11 DIAGNOSIS — I2510 Atherosclerotic heart disease of native coronary artery without angina pectoris: Secondary | ICD-10-CM | POA: Diagnosis not present

## 2021-10-11 DIAGNOSIS — J449 Chronic obstructive pulmonary disease, unspecified: Secondary | ICD-10-CM | POA: Diagnosis not present

## 2021-10-11 DIAGNOSIS — M818 Other osteoporosis without current pathological fracture: Secondary | ICD-10-CM | POA: Diagnosis not present

## 2021-10-11 DIAGNOSIS — M069 Rheumatoid arthritis, unspecified: Secondary | ICD-10-CM | POA: Diagnosis present

## 2021-10-11 DIAGNOSIS — R55 Syncope and collapse: Secondary | ICD-10-CM | POA: Diagnosis not present

## 2021-10-11 DIAGNOSIS — Z96641 Presence of right artificial hip joint: Secondary | ICD-10-CM | POA: Insufficient documentation

## 2021-10-11 DIAGNOSIS — E785 Hyperlipidemia, unspecified: Secondary | ICD-10-CM | POA: Diagnosis not present

## 2021-10-11 DIAGNOSIS — Z720 Tobacco use: Secondary | ICD-10-CM | POA: Diagnosis present

## 2021-10-11 DIAGNOSIS — Z7982 Long term (current) use of aspirin: Secondary | ICD-10-CM | POA: Diagnosis not present

## 2021-10-11 DIAGNOSIS — Z79899 Other long term (current) drug therapy: Secondary | ICD-10-CM | POA: Diagnosis not present

## 2021-10-11 DIAGNOSIS — E039 Hypothyroidism, unspecified: Secondary | ICD-10-CM | POA: Diagnosis not present

## 2021-10-11 DIAGNOSIS — F419 Anxiety disorder, unspecified: Secondary | ICD-10-CM | POA: Diagnosis present

## 2021-10-11 LAB — BASIC METABOLIC PANEL
Anion gap: 6 (ref 5–15)
BUN: 29 mg/dL — ABNORMAL HIGH (ref 8–23)
CO2: 27 mmol/L (ref 22–32)
Calcium: 8.6 mg/dL — ABNORMAL LOW (ref 8.9–10.3)
Chloride: 105 mmol/L (ref 98–111)
Creatinine, Ser: 1.03 mg/dL — ABNORMAL HIGH (ref 0.44–1.00)
GFR, Estimated: 55 mL/min — ABNORMAL LOW (ref >60)
Glucose, Bld: 128 mg/dL — ABNORMAL HIGH (ref 70–99)
Potassium: 3.4 mmol/L — ABNORMAL LOW (ref 3.5–5.1)
Sodium: 138 mmol/L (ref 135–145)

## 2021-10-11 LAB — CBC
HCT: 30.5 % — ABNORMAL LOW (ref 36.0–46.0)
Hemoglobin: 9.5 g/dL — ABNORMAL LOW (ref 12.0–15.0)
MCH: 27.2 pg (ref 26.0–34.0)
MCHC: 31.1 g/dL (ref 30.0–36.0)
MCV: 87.4 fL (ref 80.0–100.0)
Platelets: 199 10*3/uL (ref 150–400)
RBC: 3.49 MIL/uL — ABNORMAL LOW (ref 3.87–5.11)
RDW: 18.3 % — ABNORMAL HIGH (ref 11.5–15.5)
WBC: 6.3 10*3/uL (ref 4.0–10.5)
nRBC: 0 % (ref 0.0–0.2)

## 2021-10-11 LAB — TROPONIN I (HIGH SENSITIVITY): Troponin I (High Sensitivity): 13 ng/L (ref <18)

## 2021-10-11 MED ORDER — ONDANSETRON HCL 4 MG/2ML IJ SOLN
4.0000 mg | Freq: Once | INTRAMUSCULAR | Status: AC
  Administered 2021-10-11: 4 mg via INTRAVENOUS
  Filled 2021-10-11: qty 2

## 2021-10-11 MED ORDER — IOHEXOL 300 MG/ML  SOLN
100.0000 mL | Freq: Once | INTRAMUSCULAR | Status: AC | PRN
  Administered 2021-10-11: 100 mL via INTRAVENOUS

## 2021-10-11 MED ORDER — LACTATED RINGERS IV BOLUS
500.0000 mL | Freq: Once | INTRAVENOUS | Status: AC
  Administered 2021-10-11: 500 mL via INTRAVENOUS

## 2021-10-11 NOTE — ED Triage Notes (Signed)
FIRST NURSE NOTE:  Pt arrived via [facility] from home c/o gen weakness and abd pain, around 3pm had dry heaving and neck pain, VSS ? ?EKG showed occ PVC p80 ? ?98% RA  150/80  97.3 temp 138 cbg ? ?Hx RA and bone density problem. ? ? ?

## 2021-10-11 NOTE — ED Triage Notes (Signed)
Pt states nausea and generalized abd pain without diarrhea that began this afternoon. Pt states has also been having posterior neck pain, denies known fever.  ?

## 2021-10-11 NOTE — ED Notes (Signed)
Lea in lab to add on hepatic panel and lipase per Dr. Beather Arbour ?

## 2021-10-11 NOTE — ED Provider Notes (Signed)
? ?[facility] ?Provider Note ? ? ? Event Date/Time  ? First MD Initiated Contact with Patient 10/11/21 2207   ?  (approximate) ? ? ?History  ? ?Chief Complaint ?Nausea ? ? ?HPI ? ?[name] is a 81 y.o. female with past medical history of rheumatoid arthritis, GERD, COPD, and chronic pain syndrome who presents to the ED complaining of nausea and vomiting.  Patient reports that she started feeling nauseous with multiple episodes of dry heaving around lunchtime today.  She was able to make it to the bathroom where she proceeded to vomit all a couple of times.  She describes associated diffuse abdominal crampy pain and also states that she developed pain on both sides of her neck around the time of vomiting.  Family was called to assist her and reported that she was very shaky on their arrival with difficulty walking.  Patient states that her legs feel like they will give out on her when she positions her neck in certain ways.  She denies any falls or other head or neck trauma.  She denies any recent fevers, dysuria, hematuria, flank pain, cough, chest pain, or shortness of breath. ?  ? ? ?Physical Exam  ? ?Triage Vital Signs: ?ED Triage Vitals [10/11/21 2052]  ?Enc Vitals Group  ?   BP (!) 145/80  ?   Pulse Rate 79  ?   Resp 18  ?   Temp 98.1 ?F (36.7 ?C)  ?   Temp Source Oral  ?   SpO2 100 %  ?   Weight 105 lb (47.6 kg)  ?   Height '5\' 4"'$  (1.626 m)  ?   Head Circumference   ?   Peak Flow   ?   Pain Score 8  ?   Pain Loc   ?   Pain Edu?   ?   Excl. in [place]?   ? ? ?Most recent vital signs: ?Vitals:  ? 10/11/21 2330 10/12/21 0000  ?BP: 130/90 130/78  ?Pulse: 83 87  ?Resp: (!) 28 16  ?Temp:    ?SpO2: 100% 99%  ? ? ?Constitutional: Alert and oriented. ?Eyes: Conjunctivae are normal. ?Head: Atraumatic. ?Nose: No congestion/rhinnorhea. ?Mouth/Throat: Mucous membranes are moist.  ?Neck: No midline cervical spine tenderness to palpation, paraspinal and trapezius tenderness noted  bilaterally. ?Cardiovascular: Normal rate, regular rhythm. Grossly normal heart sounds.  2+ radial pulses bilaterally. ?Respiratory: Normal respiratory effort.  No retractions. Lungs CTAB. ?Gastrointestinal: Soft and diffusely tender to palpation with no rebound or guarding. No distention. ?Musculoskeletal: No lower extremity tenderness nor edema.  ?Neurologic:  Normal speech and language. No gross focal neurologic deficits are appreciated. ? ? ? ?ED Results / Procedures / Treatments  ? ?Labs ?(all labs ordered are listed, but only abnormal results are displayed) ?Labs Reviewed  ?BASIC METABOLIC PANEL - Abnormal; Notable for the following components:  ?    Result Value  ? Potassium 3.4 (*)   ? Glucose, Bld 128 (*)   ? BUN 29 (*)   ? Creatinine, Ser 1.03 (*)   ? Calcium 8.6 (*)   ? GFR, Estimated 55 (*)   ? All other components within normal limits  ?CBC - Abnormal; Notable for the following components:  ? RBC 3.49 (*)   ? Hemoglobin 9.5 (*)   ? HCT 30.5 (*)   ? RDW 18.3 (*)   ? All other components within normal limits  ?HEPATIC FUNCTION PANEL - Abnormal; Notable for the following components:  ?  Total Protein 5.9 (*)   ? Albumin 3.2 (*)   ? All other components within normal limits  ?LIPASE, BLOOD  ?URINALYSIS, ROUTINE W REFLEX MICROSCOPIC  ?TROPONIN I (HIGH SENSITIVITY)  ?TROPONIN I (HIGH SENSITIVITY)  ? ? ? ?EKG ? ?ED ECG REPORT ?Tempie Hoist, the attending physician, personally viewed and interpreted this ECG. ? ? Date: 10/11/2021 ? EKG Time: 21:02 ? Rate: 79 ? Rhythm: normal sinus rhythm ? Axis: Normal ? Intervals:none ? ST&T Change: None ? ?RADIOLOGY ?CT head reviewed by me with no hemorrhage or midline shift. ? ?PROCEDURES: ? ?Critical Care performed: No ? ?Procedures ? ? ?MEDICATIONS ORDERED IN ED: ?Medications  ?lactated ringers bolus 500 mL (0 mLs Intravenous Stopped 10/12/21 0024)  ?ondansetron [facility]) injection 4 mg (4 mg Intravenous Given 10/11/21 2320)  ?iohexol (OMNIPAQUE) 300 MG/ML solution 100 mL  (100 mLs Intravenous Contrast Given 10/11/21 2304)  ? ? ? ?IMPRESSION / MDM / ASSESSMENT AND PLAN / ED COURSE  ?I reviewed the triage vital signs and the nursing notes. ?             ?               ? ?81 y.o. female with past medical history of rheumatoid arthritis, chronic pain syndrome, GERD, and COPD who presents to the ED with nausea and multiple episodes of vomiting today with diffuse abdominal pain and pain on both sides of her neck. ? ?Differential diagnosis includes, but is not limited to, gastroenteritis, gastritis, colitis, pancreatitis, hepatitis, biliary colic, cholecystitis, bowel obstruction, UTI, kidney stone, cervical strain, cervical spine injury. ? ?Patient nontoxic-appearing and in no acute distress, vital signs are unremarkable.  EKG shows no evidence of arrhythmia or ischemia and troponin is negative, doubt cardiac etiology for her symptoms.  She does have diffuse tenderness on exam of her abdomen as well as tenderness in the paraspinal region of her cervical spine.  Her neck symptoms seem most likely related to strain while she was vomiting, however she reports difficulty walking with certain positions of her neck.  Currently, strength is intact throughout all 4 extremities and I have low suspicion for stroke or cervical spine pathology.  We will check CT of her abdomen and pelvis, also screen CT of head and neck.  Labs thus far are reassuring with no AKI or electrolyte abnormality, CBC without anemia or leukocytosis.  We will add on LFTs and lipase, treat symptomatically with IV fluids and Zofran.  Patient turned over to oncoming provider pending labs and imaging results. ? ?  ? ? ?FINAL CLINICAL IMPRESSION(S) / ED DIAGNOSES  ? ?Final diagnoses:  ?Nausea and vomiting, unspecified vomiting type  ?Strain of neck muscle, initial encounter  ? ? ? ?Rx / DC Orders  ? ?ED Discharge Orders   ? ?      Ordered  ?  ondansetron (ZOFRAN-ODT) 4 MG disintegrating tablet  Every 8 hours PRN       ? 10/12/21  0029  ?  lidocaine (LIDODERM) 5 %  Every 12 hours       ? 10/12/21 0029  ? ?  ?  ? ?  ? ? ? ?Note:  This document was prepared using Dragon voice recognition software and may include unintentional dictation errors. ?  ?Blake Divine, MD ?10/12/21 0034 ? ?

## 2021-10-12 DIAGNOSIS — R112 Nausea with vomiting, unspecified: Secondary | ICD-10-CM

## 2021-10-12 DIAGNOSIS — N179 Acute kidney failure, unspecified: Secondary | ICD-10-CM

## 2021-10-12 DIAGNOSIS — E876 Hypokalemia: Secondary | ICD-10-CM | POA: Diagnosis present

## 2021-10-12 DIAGNOSIS — E785 Hyperlipidemia, unspecified: Secondary | ICD-10-CM | POA: Diagnosis present

## 2021-10-12 DIAGNOSIS — R55 Syncope and collapse: Secondary | ICD-10-CM | POA: Diagnosis present

## 2021-10-12 DIAGNOSIS — E039 Hypothyroidism, unspecified: Secondary | ICD-10-CM

## 2021-10-12 DIAGNOSIS — K529 Noninfective gastroenteritis and colitis, unspecified: Secondary | ICD-10-CM | POA: Diagnosis present

## 2021-10-12 LAB — URINALYSIS, ROUTINE W REFLEX MICROSCOPIC
Bilirubin Urine: NEGATIVE
Glucose, UA: 100 mg/dL — AB
Hgb urine dipstick: NEGATIVE
Ketones, ur: NEGATIVE mg/dL
Leukocytes,Ua: NEGATIVE
Nitrite: NEGATIVE
Specific Gravity, Urine: 1.02 (ref 1.005–1.030)
pH: 7 (ref 5.0–8.0)

## 2021-10-12 LAB — CBC
HCT: 26.3 % — ABNORMAL LOW (ref 36.0–46.0)
Hemoglobin: 8 g/dL — ABNORMAL LOW (ref 12.0–15.0)
MCH: 26.4 pg (ref 26.0–34.0)
MCHC: 30.4 g/dL (ref 30.0–36.0)
MCV: 86.8 fL (ref 80.0–100.0)
Platelets: 198 10*3/uL (ref 150–400)
RBC: 3.03 MIL/uL — ABNORMAL LOW (ref 3.87–5.11)
RDW: 18 % — ABNORMAL HIGH (ref 11.5–15.5)
WBC: 5.2 10*3/uL (ref 4.0–10.5)
nRBC: 0 % (ref 0.0–0.2)

## 2021-10-12 LAB — BASIC METABOLIC PANEL
Anion gap: 6 (ref 5–15)
BUN: 23 mg/dL (ref 8–23)
CO2: 26 mmol/L (ref 22–32)
Calcium: 8.1 mg/dL — ABNORMAL LOW (ref 8.9–10.3)
Chloride: 105 mmol/L (ref 98–111)
Creatinine, Ser: 0.96 mg/dL (ref 0.44–1.00)
GFR, Estimated: >60 mL/min (ref >60)
Glucose, Bld: 100 mg/dL — ABNORMAL HIGH (ref 70–99)
Potassium: 3.6 mmol/L (ref 3.5–5.1)
Sodium: 137 mmol/L (ref 135–145)

## 2021-10-12 LAB — LIPASE, BLOOD: Lipase: 27 U/L (ref 11–51)

## 2021-10-12 LAB — URINALYSIS, MICROSCOPIC (REFLEX)
Bacteria, UA: NONE SEEN
RBC / HPF: NONE SEEN RBC/hpf (ref 0–5)
WBC, UA: NONE SEEN WBC/hpf (ref 0–5)

## 2021-10-12 LAB — MAGNESIUM: Magnesium: 2.3 mg/dL (ref 1.7–2.4)

## 2021-10-12 LAB — HEPATIC FUNCTION PANEL
ALT: 17 U/L (ref 0–44)
AST: 25 U/L (ref 15–41)
Albumin: 3.2 g/dL — ABNORMAL LOW (ref 3.5–5.0)
Alkaline Phosphatase: 59 U/L (ref 38–126)
Bilirubin, Direct: 0.1 mg/dL (ref 0.0–0.2)
Indirect Bilirubin: 0.6 mg/dL (ref 0.3–0.9)
Total Bilirubin: 0.7 mg/dL (ref 0.3–1.2)
Total Protein: 5.9 g/dL — ABNORMAL LOW (ref 6.5–8.1)

## 2021-10-12 LAB — TROPONIN I (HIGH SENSITIVITY): Troponin I (High Sensitivity): 15 ng/L (ref <18)

## 2021-10-12 MED ORDER — ACETAMINOPHEN 650 MG RE SUPP: 650.0000 mg | Freq: Four times a day (QID) | RECTAL | Status: DC | PRN

## 2021-10-12 MED ORDER — ONDANSETRON HCL 4 MG/2ML IJ SOLN: 4.0000 mg | Freq: Four times a day (QID) | INTRAMUSCULAR | Status: DC | PRN

## 2021-10-12 MED ORDER — NICOTINE 14 MG/24HR TD PT24: 14.0000 mg | MEDICATED_PATCH | Freq: Every day | TRANSDERMAL | Status: DC | PRN

## 2021-10-12 MED ORDER — PIPERACILLIN-TAZOBACTAM 3.375 G IVPB 30 MIN
3.3750 g | Freq: Once | INTRAVENOUS | Status: AC
  Administered 2021-10-12: 3.375 g via INTRAVENOUS
  Filled 2021-10-12: qty 50

## 2021-10-12 MED ORDER — PRO-STAT SUGAR FREE PO LIQD: 30.0000 mL | Freq: Every day | ORAL | Status: DC

## 2021-10-12 MED ORDER — PANTOPRAZOLE SODIUM 40 MG PO TBEC: 80.0000 mg | DELAYED_RELEASE_TABLET | Freq: Every day | ORAL | Status: DC

## 2021-10-12 MED ORDER — TIZANIDINE HCL 2 MG PO TABS
2.0000 mg | ORAL_TABLET | Freq: Two times a day (BID) | ORAL | Status: DC
Start: 2021-10-12 — End: 2021-10-12
  Administered 2021-10-12: 2 mg via ORAL
  Filled 2021-10-12: qty 1

## 2021-10-12 MED ORDER — LEVOTHYROXINE SODIUM 50 MCG PO TABS
50.0000 ug | ORAL_TABLET | Freq: Every day | ORAL | Status: DC
  Administered 2021-10-12: 50 ug via ORAL
  Filled 2021-10-12: qty 1

## 2021-10-12 MED ORDER — LACTATED RINGERS IV BOLUS
500.0000 mL | Freq: Once | INTRAVENOUS | Status: AC
  Administered 2021-10-12: 500 mL via INTRAVENOUS

## 2021-10-12 MED ORDER — POTASSIUM CHLORIDE 10 MEQ/100ML IV SOLN
10.0000 meq | INTRAVENOUS | Status: AC
  Administered 2021-10-12 (×2): 10 meq via INTRAVENOUS
  Filled 2021-10-12: qty 100

## 2021-10-12 MED ORDER — LIDOCAINE 5 % EX PTCH: 1.0000 | MEDICATED_PATCH | Freq: Two times a day (BID) | CUTANEOUS | 0 refills | Status: DC

## 2021-10-12 MED ORDER — BUPROPION HCL ER (XL) 150 MG PO TB24: 150.0000 mg | ORAL_TABLET | Freq: Every day | ORAL | Status: DC

## 2021-10-12 MED ORDER — ATORVASTATIN CALCIUM 20 MG PO TABS: 20.0000 mg | ORAL_TABLET | Freq: Every day | ORAL | Status: DC

## 2021-10-12 MED ORDER — SODIUM CHLORIDE 0.9 % IV SOLN
12.5000 mg | Freq: Four times a day (QID) | INTRAVENOUS | Status: DC | PRN
  Filled 2021-10-12: qty 0.5

## 2021-10-12 MED ORDER — ENOXAPARIN SODIUM 40 MG/0.4ML IJ SOSY: 40.0000 mg | PREFILLED_SYRINGE | INTRAMUSCULAR | Status: DC

## 2021-10-12 MED ORDER — ONDANSETRON HCL 4 MG PO TABS: 4.0000 mg | ORAL_TABLET | Freq: Four times a day (QID) | ORAL | Status: DC | PRN

## 2021-10-12 MED ORDER — ACETAMINOPHEN 325 MG PO TABS: 650.0000 mg | ORAL_TABLET | Freq: Four times a day (QID) | ORAL | Status: DC | PRN

## 2021-10-12 MED ORDER — SODIUM CHLORIDE 0.9 % IV SOLN: INTRAVENOUS | Status: DC

## 2021-10-12 MED ORDER — ASPIRIN EC 81 MG PO TBEC: 81.0000 mg | DELAYED_RELEASE_TABLET | Freq: Every day | ORAL | Status: DC

## 2021-10-12 MED ORDER — ONDANSETRON 4 MG PO TBDP
4.0000 mg | ORAL_TABLET | Freq: Three times a day (TID) | ORAL | 0 refills | Status: DC | PRN
Start: 2021-10-12 — End: 2022-04-04

## 2021-10-12 MED ORDER — GALANTAMINE HYDROBROMIDE 4 MG PO TABS
8.0000 mg | ORAL_TABLET | Freq: Two times a day (BID) | ORAL | Status: DC
Start: 2021-10-12 — End: 2021-10-12
  Filled 2021-10-12: qty 2

## 2021-10-12 MED ORDER — KATE FARMS STANDARD 1.4 PO LIQD
325.0000 mL | Freq: Every day | ORAL | Status: DC
  Filled 2021-10-12: qty 325

## 2021-10-12 MED ORDER — ATORVASTATIN CALCIUM 20 MG PO TABS: 10.0000 mg | ORAL_TABLET | Freq: Every day | ORAL | Status: DC

## 2021-10-12 MED ORDER — FAMOTIDINE 20 MG PO TABS: 20.0000 mg | ORAL_TABLET | Freq: Every day | ORAL | Status: DC

## 2021-10-12 MED ORDER — AMITRIPTYLINE HCL 10 MG PO TABS: 10.0000 mg | ORAL_TABLET | Freq: Every day | ORAL | Status: DC

## 2021-10-12 MED ORDER — DULOXETINE HCL 60 MG PO CPEP: 60.0000 mg | ORAL_CAPSULE | Freq: Every day | ORAL | Status: DC

## 2021-10-12 NOTE — ED Notes (Signed)
Breakfast tray given at this time.  

## 2021-10-12 NOTE — ED Notes (Signed)
Pt c/o burning at the IV site, pt's potassium rate adjusted to 24m/hr.  ?

## 2021-10-12 NOTE — ED Provider Notes (Signed)
----------------------------------------- ?  12:35 AM on 10/12/2021 ?-----------------------------------------  ? ?CT head and C-spine unremarkable.  CT abdomen/pelvis demonstrates colitis.  Patient had near syncopal episode getting up to use the restroom.  Urinalysis machine is down so results will be delayed.  Given consolation of symptoms, will consult hospitalist services for evaluation and admission.  Will redose IV fluids and initiate treatment with IV Zosyn. ?  ?Paulette Blanch, MD ?10/12/21 [phone] ? ?

## 2021-10-12 NOTE — Assessment & Plan Note (Signed)
-   Levothyroxine 50 mcg daily before breakfast resumed 

## 2021-10-12 NOTE — H&P (Addendum)
?History and Physical  ? ?[name] MEQ:[id number] DOB: Aug 01, 1940 DOA: 10/11/2021 ? ?PCP: Rusty Aus, MD  ?Outpatient Specialists: Dr. Percell Boston, rheumatology ?Patient coming from: Home ? ?I have personally briefly reviewed patient's old medical records in [place]. ? ?Chief Concern: Intractable nausea and vomiting ? ?HPI: [name] is a 81 year old female with depression, hypothyroid, hyperlipidemia, GERD, memory disturbance, history of IBS, with constipation, CAD, sleep apnea, rheumatoid arthritis, who presents emergency department for chief concerns of intractable nausea and vomiting. ? ?Initial vitals in the emergency department showed temperature of 98.1, respiration rate 18, heart rate 79, blood pressure 145/80, SPO2 100% on room air. ? ?Serum sodium 138, potassium 3.4, chloride 105, bicarb 27, BUN of 29, serum creatinine 1.03, GFR greater than 55. ?WBC 6.3, hemoglobin 9.5, platelets of 199. ? ?High since troponin was 13 and the second redraw is 15. ? ?She had persistent nausea and vomiting, and then while she was vomiting she endorsed neck pain.  ED provider ordered a CT cervical spine without contrast which was read as no acute intracranial pathology, mild age-related pathology and chronic microvascular ischemic changes. ? ?During ED evaluation, when patient got up to use the restroom, she nearly syncopized. ? ?ED treatment: LR 500 mL bolus, ED for provider ordered a second dose of LR 500 mL.  Ondansetron 4 mg IV. ? ?At bedside patient is able to tell me her name, her age, she knows she is in the hospital and she knows the current calendar year.  She is in the process of attempting to eat crackers. ? ?She reports that she developed 1 episode of diarrhea, nausea, vomiting thus prompting her to present to the emergency department for further evaluation. ? ?She reports the episode of diarrhea was brown and watery and it was one-time on day of presentation.  She denies changes  to her diet and recent antibiotic use.  She reports that the nausea has been persistent throughout the ED and is relieved with the IV medications given to her in the emergency department. ? ?She denies known sick contacts, fever, cough, chills, chest pain, shortness of breath, dysuria, syncope. ? ?She endorsed that when she got up to use the restroom in the emergency department her legs became so weak she could barely walk.  She reports she did not lose consciousness and she did not pass out. ? ?Social history: She lives at home by herself.  She is a current tobacco user, smoking half a pack per day and she started since she was 81 years old.  She denies EtOH and recreational drug use.  She is retired and formerly worked Visual merchandiser. ? ?Vaccination history: She is vaccinated for COVID 19 and influenza. ? ?ROS: ?Constitutional: no weight change, no fever ?ENT/Mouth: no sore throat, no rhinorrhea ?Eyes: no eye pain, no vision changes ?Cardiovascular: no chest pain, no dyspnea,  no edema, no palpitations ?Respiratory: no cough, no sputum, no wheezing ?Gastrointestinal: + nausea, + vomiting, + diarrhea, no constipation ?Genitourinary: no urinary incontinence, no dysuria, no hematuria ?Musculoskeletal: no arthralgias, no myalgias ?Skin: no skin lesions, no pruritus, ?Neuro: + weakness, no loss of consciousness, no syncope ?Psych: no anxiety, no depression, + decrease appetite ?Heme/Lymph: no bruising, no bleeding ? ?ED Course: Discussed with emergency medicine provider, patient requiring hospitalization for chief concerns of intractable nausea and vomiting. ? ?Assessment/Plan ? ?Principal Problem: ?  Intractable nausea and vomiting ?Active Problems: ?  GERD (gastroesophageal reflux disease) ?  Tobacco abuse ?  Hypothyroid ?  COPD (chronic obstructive pulmonary disease) ([place]) ?  Anxiety ?  RA (rheumatoid arthritis) ([place]) ?  Adult idiopathic generalized osteoporosis ?  Colitis ?  Hyperlipidemia ?  Hypokalemia ?   Near syncope ?  ?Assessment and Plan: ? ?* Intractable nausea and vomiting ?- Query viral gastroenteritis, given no elevated leukocytosis ?- Supportive measures ?- Status post 1 L LR bolus per EDP ?- Ordered sodium chloride 100 mL/h, 10 hours ordered ?- Ondansetron p.o. as needed for nausea, IV for vomiting, Phenergan 12.5 mg IV every 6 hours as needed for for refractory nausea and vomiting, 2 doses ?- Aspiration precaution ?- Admit to MedSurg, observation ? ?Near syncope ?- Presumed secondary to volume loss, in setting of intractable nausea and vomiting ?- CT of the head without contrast was read as negative for acute intracranial pathology ?- Fall precautions, symptomatic support as above ? ?Hypokalemia ?- Presumed secondary to GI loss ?- Check magnesium ?- Potassium chloride 10 mill globin IV, administer over 60 minutes, 2 doses ordered ?- BMP in the a.m. ? ?Hyperlipidemia ?- Atorvastatin 20 mg daily resumed ? ?Colitis ?- On CT imaging ?- With reported diarrhea ?- Status post Zosyn one-time dose per EDP ?- Check C. difficile and GI panel PCR ?- Pending C. difficile and GI PCR panel, no further antibiotic initiated at this time; a.m. team to initiate when appropriate ? ?RA (rheumatoid arthritis) ([place]) ?- Patient takes methotrexate weekly ? ?Hypothyroid ?- Levothyroxine 50 mcg daily before breakfast resumed ? ?Tobacco abuse ?- Nicotine patch as needed ordered ? ?GERD (gastroesophageal reflux disease) ?- PPI resumed ? ?Chart reviewed.  ? ?DVT prophylaxis: Enoxaparin ?Code Status: Limited, patient is okay with chest compression and ACLS medications. patient does not want to be intubated. ?Diet: Heart healthy ?Family Communication: No ?Disposition Plan: Pending clinical course, anticipate discharge in the a.m. ?Consults called: None at this time ?Admission status: MedSurg, observation ? ?Past Medical History:  ?Diagnosis Date  ? Abdominal pain   ? Anxiety   ? Arthritis, rheumatoid ([place])   ? Breast pain   ? Chest  discomfort   ? Chest pain   ? Chronic pain syndrome   ? Collagen vascular disease ([place])   ? hx rheumatoid arthritis.   ? Constipation   ? COPD (chronic obstructive pulmonary disease) ([place])   ? Diverticulosis   ? Fibrocystic breast disease   ? Gastritis   ? GERD (gastroesophageal reflux disease)   ? Hemorrhoids   ? Hx of colonic polyps   ? Hypothyroidism   ? Osteopenia   ? Renal artery stenosis ([place])   ? Right ankle pain   ? Thyroid binding globulin deficiency   ? Tobacco abuse   ? ?Past Surgical History:  ?Procedure Laterality Date  ? CHOLECYSTECTOMY    ? JOINT REPLACEMENT    ? right hip  ? LEG SURGERY    ? TONSILLECTOMY    ? ?Social History:  reports that she has been smoking cigarettes. She has been smoking an average of 1 pack per day. She has never used smokeless tobacco. She reports that she does not drink alcohol and does not use drugs. ? ?Allergies  ?Allergen Reactions  ? Other Other (See Comments)  ?  Uncoded Allergy. Allergen: symmetral ?Uncoded Allergy. Allergen: etrafon ?Uncoded Allergy. Allergen: desbutal ?Uncoded Allergy. Allergen: symmetral ?Uncoded Allergy. Allergen: etrafon ?Uncoded Allergy. Allergen: desbutal ?Other reaction(s): Unknown ?Uncoded Allergy. Allergen: symmetral ?Other reaction(s): Unknown ?Uncoded Allergy. Allergen: etrafon ?Uncoded Allergy. Allergen: symmetral ?Uncoded Allergy. Allergen: etrafon ?Uncoded Allergy.  Allergen: desbutal ?  ? Sulfa Antibiotics   ?  Other reaction(s): Unknown ?Uncoded Allergy. Allergen: desbutal  ? Etrafon [Perphenazine-Amitriptyline]   ? Hydrocodone Nausea And Vomiting  ? Mirtazapine   ?  Other reaction(s): Hallucination  ? Doxycycline Nausea Only  ? Percocet [Oxycodone-Acetaminophen] Nausea And Vomiting  ? Sertraline Palpitations  ? ?Family History  ?Problem Relation Age of Onset  ? Heart disease Sister   ? Hypertension Brother   ? Heart attack Brother   ? Hyperlipidemia Brother   ? Breast cancer Neg Hx   ? ?Family history: Family history reviewed and not  pertinent. ? ?Prior to Admission medications   ?Medication Sig Start Date End Date Taking? Authorizing Provider  ?lidocaine (LIDODERM) 5 % Place 1 patch onto the skin every 12 (twelve) hours. Remove & Discard

## 2021-10-12 NOTE — Assessment & Plan Note (Addendum)
Noted on CT although even this is not clear.  Could have been nondistention. ?

## 2021-10-12 NOTE — Assessment & Plan Note (Signed)
Secondary to dehydration.  Resolved with IV fluids. ?

## 2021-10-12 NOTE — Assessment & Plan Note (Signed)
-   Nicotine patch as needed ordered ?

## 2021-10-12 NOTE — Assessment & Plan Note (Addendum)
Suspect gastroenteritis over colitis given quick resolution of symptoms and tolerance of p.o.  Symptoms have since passed and patient was able to eat breakfast this morning without incident.  No need for additional p.o. antibiotics ?

## 2021-10-12 NOTE — Hospital Course (Addendum)
81 year old female with past medical history of depression, hypothyroidism memory disturbance, rheumatoid arthritis and CAD presented to the emergency room on 5/7 with complaints of intractable nausea and vomiting.  Labs overall unremarkable.  Initially this was felt to be secondary to gastroenteritis.  Patient received medication for nausea and vomiting.  During ED evaluation when she got up to use the restroom, she nearly passed out.  Hospitalists were called and patient was brought in for further evaluation.  ? ?Patient monitored overnight.  By following morning, symptoms have resolved. ?

## 2021-10-12 NOTE — Assessment & Plan Note (Signed)
-   Atorvastatin 20 mg daily resumed 

## 2021-10-12 NOTE — Discharge Summary (Signed)
?Physician Discharge Summary ?  ?Patient: [name] MRN: [id number] DOB: 30-Apr-1941  ?Admit date:     10/11/2021  ?Discharge date: 10/12/21  ?Discharge Physician: Annita Brod  ? ?PCP: Rusty Aus, MD  ? ?Recommendations at discharge:  ? ?Patient has a scheduled appointment already with her primary care physician this week.  She will keep it. ?Patient advised to call back and have this attending paged if her symptoms recur in the next 24 hours. ? ?Discharge Diagnoses: ?Principal Problem: ?  Intractable nausea and vomiting ?Active Problems: ?  Near syncope ?  Colitis ?  Hypokalemia ?  RA (rheumatoid arthritis) ([place]) ?  Hyperlipidemia ?  GERD (gastroesophageal reflux disease) ?  Tobacco abuse ?  Hypothyroid ?  COPD (chronic obstructive pulmonary disease) ([place]) ?  Anxiety ?  Adult idiopathic generalized osteoporosis ? ?Resolved Problems: ?  AKI (acute kidney injury) ([place]) ? ?Hospital Course: ?81 year old female with past medical history of depression, hypothyroidism memory disturbance, rheumatoid arthritis and CAD presented to the emergency room on 5/7 with complaints of intractable nausea and vomiting.  Labs overall unremarkable.  Initially this was felt to be secondary to gastroenteritis.  Patient received medication for nausea and vomiting.  During ED evaluation when she got up to use the restroom, she nearly passed out.  Hospitalists were called and patient was brought in for further evaluation.  ? ?Patient monitored overnight.  By following morning, symptoms have resolved. ? ?Assessment and Plan: ?* Intractable nausea and vomiting ?Suspect gastroenteritis over colitis given quick resolution of symptoms and tolerance of p.o.  Symptoms have since passed and patient was able to eat breakfast this morning without incident.  No need for additional p.o. antibiotics ? ?Near syncope ?Secondary to volume loss from intractable nausea and vomiting as indicated by her acute kidney injury.  By following day,  patient feeling much better and able to ambulate well.  Blood pressures are stable. ? ?Colitis ?Noted on CT although even this is not clear.  Could have been nondistention. ? ?AKI (acute kidney injury) ([facility])-resolved as of 10/12/2021 ?Secondary to dehydration.  Resolved with IV fluids. ? ?Hypokalemia ?- Presumed secondary to GI loss ?Magnesium level normal.  Potassium replaced and follow potassium in the morning normal. ? ? ?RA (rheumatoid arthritis) ([place]) ?- Patient takes methotrexate weekly ? ?Hyperlipidemia ?- Atorvastatin 20 mg daily resumed ? ?GERD (gastroesophageal reflux disease) ?- PPI resumed ? ?Tobacco abuse ?- Nicotine patch as needed ordered ? ?Hypothyroid ?- Levothyroxine 50 mcg daily before breakfast resumed ? ? ? ? ?  ? ? ?Consultants: None ?Procedures performed: None ?Disposition: Home ?Diet recommendation:  ?Discharge Diet Orders (From admission, onward)  ? ?  Start     Ordered  ? 10/12/21 0000  Diet - low sodium heart healthy       ? 10/12/21 1003  ? ?  ?  ? ?  ? ?Cardiac diet ?DISCHARGE MEDICATION: ?Allergies as of 10/12/2021   ? ?   Reactions  ? Other Other (See Comments)  ? Uncoded Allergy. Allergen: symmetral ?Uncoded Allergy. Allergen: etrafon ?Uncoded Allergy. Allergen: desbutal ?Uncoded Allergy. Allergen: symmetral ?Uncoded Allergy. Allergen: etrafon ?Uncoded Allergy. Allergen: desbutal ?Other reaction(s): Unknown ?Uncoded Allergy. Allergen: symmetral ?Other reaction(s): Unknown ?Uncoded Allergy. Allergen: etrafon ?Uncoded Allergy. Allergen: symmetral ?Uncoded Allergy. Allergen: etrafon ?Uncoded Allergy. Allergen: desbutal  ? Sulfa Antibiotics   ? Other reaction(s): Unknown ?Uncoded Allergy. Allergen: desbutal  ? Etrafon [perphenazine-amitriptyline]   ? Hydrocodone Nausea And Vomiting  ? Mirtazapine   ? Other  reaction(s): Hallucination  ? Doxycycline Nausea Only  ? Percocet [oxycodone-acetaminophen] Nausea And Vomiting  ? Sertraline Palpitations  ? ?  ? ?  ?Medication List  ?  ? ?TAKE these  medications   ? ?amitriptyline 10 MG tablet ?Commonly known as: ELAVIL ?Take 10 mg by mouth at bedtime. ?  ?aspirin 81 MG EC tablet ?Take 1 tablet (81 mg total) by mouth daily. Swallow whole. ?  ?atorvastatin 10 MG tablet ?Commonly known as: LIPITOR ?Take 10 mg by mouth daily. ?  ?buPROPion 150 MG 24 hr tablet ?Commonly known as: WELLBUTRIN XL ?Take 150 mg by mouth daily. ?  ?dicyclomine 10 MG capsule ?Commonly known as: BENTYL ?Take 10 mg by mouth 2 (two) times daily as needed. ?  ?DULoxetine 60 MG capsule ?Commonly known as: CYMBALTA ?Take 60 mg by mouth daily. ?  ?etodolac 400 MG tablet ?Commonly known as: LODINE ?Take 400 mg by mouth 2 (two) times daily. ?  ?famotidine 40 MG tablet ?Commonly known as: PEPCID ?Take 40 mg by mouth at bedtime. ?  ?folic acid 1 MG tablet ?Commonly known as: FOLVITE ?Take 1 tablet by mouth daily. ?  ?galantamine 8 MG tablet ?Commonly known as: RAZADYNE ?Take 8 mg by mouth daily. ?  ?gentamicin ointment 0.1 % ?Commonly known as: GARAMYCIN ?Apply 1 application. topically 3 (three) times daily. ?  ?levothyroxine 50 MCG tablet ?Commonly known as: SYNTHROID ?Take 50 mcg by mouth daily. ?  ?lidocaine 5 % ?Commonly known as: Lidoderm ?Place 1 patch onto the skin every 12 (twelve) hours. Remove & Discard patch within 12 hours or as directed by MD ?  ?methotrexate 250 MG/10ML injection ?ADMINISTER 0.8 ML ONCE A WEEK ?  ?omeprazole 40 MG capsule ?Commonly known as: PRILOSEC ?Take 40 mg by mouth daily. ?  ?ondansetron 4 MG disintegrating tablet ?Commonly known as: ZOFRAN-ODT ?Take 1 tablet (4 mg total) by mouth every 8 (eight) hours as needed for nausea or vomiting. ?  ?tiZANidine 2 MG tablet ?Commonly known as: ZANAFLEX ?Take 2 mg by mouth 2 (two) times daily. ?  ?triamcinolone 55 MCG/ACT Aero nasal inhaler ?Commonly known as: NASACORT ?Place 2 sprays into the nose daily. ?  ? ?  ? ? Follow-up Information   ? ? [facility]   ?Specialty: Emergency  Medicine ?Why: If symptoms worsen ?Contact information: ?[id number] ?[place] ?[phone] ? ?  ?  ? ? Schedule an appointment as soon as possible for a visit  with Rusty Aus, MD.   ?Specialty: Internal Medicine ?Contact information: ?[place] ?[facility] ?[place] [address] ?[phone] ? ? ?  ?  ? ?  ?  ? ?  ? ?Discharge Exam: ?Filed Weights  ? 10/11/21 2052  ?Weight: 47.6 kg  ? ?General: Alert and oriented x3, no acute distress ?Cardiovascular: Regular rate and rhythm, S1-S2 ?Lungs: Clear to auscultation bilaterally ? ?Condition at discharge: good ? ?The results of significant diagnostics from this hospitalization (including imaging, microbiology, ancillary and laboratory) are listed below for reference.  ? ?Imaging Studies: ?DG Chest 2 View ? ?Result Date: 10/11/2021 ?CLINICAL DATA:  Abdominal pain and nausea. EXAM: CHEST - 2 VIEW COMPARISON:  Chest radiograph dated 12/07/2019. FINDINGS: No focal consolidation, pleural effusion, or pneumothorax. Background of emphysema. The cardiac silhouette is within limits. Atherosclerotic calcification of the aorta. Osteopenia with degenerative changes of the spine. No acute osseous pathology. IMPRESSION: No active cardiopulmonary disease. Electronically Signed  By: Anner Crete M.D.   On: 10/11/2021 21:30  ? ?CT Head Wo Contrast ? ?Result Date: 10/11/2021 ?CLINICAL DATA:  Neurologic deficit.  Concern for stroke. EXAM: CT HEAD WITHOUT CONTRAST CT CERVICAL SPINE WITHOUT CONTRAST TECHNIQUE: Multidetector CT imaging of the head and cervical spine was performed following the standard protocol without intravenous contrast. Multiplanar CT image reconstructions of the cervical spine were also generated. RADIATION DOSE REDUCTION: This exam was performed according to the departmental dose-optimization program which includes automated exposure control, adjustment of the mA and/or kV  according to patient size and/or use of iterative reconstruction technique. COMPARISON:  Head CT dated 10/02/2020. FINDINGS: CT HEAD FINDINGS Brain: Mild age-related atrophy and chronic microvascular ischemic changes. T

## 2021-10-12 NOTE — Assessment & Plan Note (Addendum)
-   Presumed secondary to GI loss ?Magnesium level normal.  Potassium replaced and follow potassium in the morning normal. ? ?

## 2021-10-12 NOTE — Assessment & Plan Note (Signed)
-   Patient takes methotrexate weekly ?

## 2021-10-12 NOTE — Assessment & Plan Note (Signed)
-   PPI resumed °

## 2021-10-12 NOTE — Assessment & Plan Note (Addendum)
Secondary to volume loss from intractable nausea and vomiting as indicated by her acute kidney injury.  By following day, patient feeling much better and able to ambulate well.  Blood pressures are stable. ?

## 2021-10-21 ENCOUNTER — Encounter: Payer: Self-pay | Admitting: Oncology

## 2021-10-21 ENCOUNTER — Inpatient Hospital Stay: Payer: Medicare Other | Attending: Oncology | Admitting: Oncology

## 2021-10-21 ENCOUNTER — Inpatient Hospital Stay: Payer: Medicare Other

## 2021-10-21 VITALS — BP 96/63 | HR 95 | Temp 98.1°F | Resp 18 | Ht 64.0 in | Wt 90.5 lb

## 2021-10-21 DIAGNOSIS — F17210 Nicotine dependence, cigarettes, uncomplicated: Secondary | ICD-10-CM | POA: Insufficient documentation

## 2021-10-21 DIAGNOSIS — D509 Iron deficiency anemia, unspecified: Secondary | ICD-10-CM | POA: Diagnosis present

## 2021-10-21 DIAGNOSIS — D649 Anemia, unspecified: Secondary | ICD-10-CM | POA: Insufficient documentation

## 2021-10-21 NOTE — Progress Notes (Signed)
Hematology/Oncology Consult note [facility] Telephone:([phone] [phone]  Patient Care Team: Rusty Aus, MD as PCP - General (Unknown Physician Specialty)   Name of the patient: [name]  [id number]  July 24, 1940    Reason for referral-iron deficiency anemia   Referring physician-Dr. Sabra Heck  Date of visit: 10/21/21   History of presenting illness-patient is a 81 year old female with a past medical history significant for hypertension hypothyroidism hyperlipidemia rheumatoid arthritis among other medical problems.  She has been referred for iron deficiency anemia.  Her recent CBC from 10/12/2021 showed white count of 5.2, H&H of 8/26.3 with an MCV of 86 and a platelet count of 198.  Folate levels were normal.  Iron studies showed a low iron saturation of 6%.  Reticulocyte count was low at 1.4%.  B12 levels normal at 368.  Looking back at her prior CBCs patient's baseline hemoglobin runs around 12 up until July 2022 and then started gradually drifting down to the tens in October 2022.  No recent ferritin levels checked.  TSH normal at 4.7.  ECOG PS- 1  Pain scale- 0   Review of systems- Review of Systems  Constitutional:  Positive for malaise/fatigue. Negative for chills, fever and weight loss.  HENT:  Negative for congestion, ear discharge and nosebleeds.   Eyes:  Negative for blurred vision.  Respiratory:  Negative for cough, hemoptysis, sputum production, shortness of breath and wheezing.   Cardiovascular:  Negative for chest pain, palpitations, orthopnea and claudication.  Gastrointestinal:  Negative for abdominal pain, blood in stool, constipation, diarrhea, heartburn, melena, nausea and vomiting.  Genitourinary:  Negative for dysuria, flank pain, frequency, hematuria and urgency.  Musculoskeletal:  Negative for back pain, joint pain and myalgias.  Skin:  Negative for rash.  Neurological:  Negative for dizziness, tingling, focal  weakness, seizures, weakness and headaches.  Endo/Heme/Allergies:  Does not bruise/bleed easily.  Psychiatric/Behavioral:  Negative for depression and suicidal ideas. The patient does not have insomnia.    Allergies  Allergen Reactions   Other Other (See Comments)    Uncoded Allergy. Allergen: symmetral Uncoded Allergy. Allergen: etrafon Uncoded Allergy. Allergen: desbutal Uncoded Allergy. Allergen: symmetral Uncoded Allergy. Allergen: etrafon Uncoded Allergy. Allergen: desbutal Other reaction(s): Unknown Uncoded Allergy. Allergen: symmetral Other reaction(s): Unknown Uncoded Allergy. Allergen: etrafon Uncoded Allergy. Allergen: symmetral Uncoded Allergy. Allergen: etrafon Uncoded Allergy. Allergen: desbutal    Sulfa Antibiotics     Other reaction(s): Unknown Uncoded Allergy. Allergen: desbutal   Denosumab Itching   Etrafon [Perphenazine-Amitriptyline]    Hydrocodone Nausea And Vomiting   Mirtazapine     Other reaction(s): Hallucination   Doxycycline Nausea Only   Percocet [Oxycodone-Acetaminophen] Nausea And Vomiting   Sertraline Palpitations    Patient Active Problem List   Diagnosis Date Noted   Intractable nausea and vomiting 10/12/2021   Colitis 10/12/2021   Hyperlipidemia 10/12/2021   Hypokalemia 10/12/2021   Near syncope 10/12/2021   TIA (transient ischemic attack) 12/07/2019   Cataracts, bilateral 11/24/2016   Adenomatous polyps 11/24/2016   Low back pain 11/24/2016   Adult idiopathic generalized osteoporosis 06/08/2016   Vitamin B12 deficiency anemia due to intrinsic factor deficiency 04/22/2016   Closed intertrochanteric fracture of right femur ([place]) 03/30/2016   History of nonmelanoma skin cancer 11/17/2015   Compression fracture of lumbar vertebra ([place]) 05/27/2015   Valvular heart disease 03/21/2015   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   Pathological fracture due to osteoporosis 03/04/2015   RA (rheumatoid arthritis) ([place]) 02/18/2015  Arthritis, rheumatoid (HCC)    Thyroid binding globulin deficiency    Breast pain    GERD (gastroesophageal reflux disease)    Tobacco abuse    Chest discomfort    Abdominal pain    Hypothyroid    Renal artery stenosis (HCC)    COPD (chronic obstructive pulmonary disease) (HCC)    Hx of colonic polyps    Anxiety    Hemorrhoids    Gastritis    Diverticulosis    Fibrocystic breast disease    Constipation    Osteoporosis 10/10/2013   Pernicious anemia 10/10/2013   Chronic pain syndrome 08/03/2007     Past Medical History:  Diagnosis Date   Abdominal pain    Anxiety    Arthritis, rheumatoid (HCC)    Breast pain    Chest discomfort    Chest pain    Chronic pain syndrome    Collagen vascular disease (HCC)    hx rheumatoid arthritis.    Constipation    COPD (chronic obstructive pulmonary disease) (HCC)    Diverticulosis    Fibrocystic breast disease    Gastritis    GERD (gastroesophageal reflux disease)    Hemorrhoids    Hx of colonic polyps    Hypothyroidism    Osteopenia    Renal artery stenosis (HCC)    Right ankle pain    Thyroid binding globulin deficiency    Tobacco abuse      Past Surgical History:  Procedure Laterality Date   CHOLECYSTECTOMY     JOINT REPLACEMENT     right hip   LEG SURGERY     TONSILLECTOMY      Social History   Socioeconomic History   Marital status: Single    Spouse name: Not on file   Number of children: Not on file   Years of education: Not on file   Highest education level: Not on file  Occupational History   Not on file  Tobacco Use   Smoking status: Every Day    Packs/day: 1.00    Types: Cigarettes   Smokeless tobacco: Never  Substance and Sexual Activity   Alcohol use: No   Drug use: No   Sexual activity: Not on file  Other Topics Concern   Not on file  Social History Narrative   Not on file   Social Determinants of Health   Financial Resource Strain: Not on file  Food Insecurity: Not on file   Transportation Needs: Not on file  Physical Activity: Not on file  Stress: Not on file  Social Connections: Not on file  Intimate Partner Violence: Not on file     Family History  Problem Relation Age of Onset   Heart disease Sister    Hypertension Brother    Heart attack Brother    Hyperlipidemia Brother    Breast cancer Neg Hx      Current Outpatient Medications:    amitriptyline (ELAVIL) 10 MG tablet, Take 10 mg by mouth at bedtime., Disp: , Rfl:    aspirin EC 81 MG EC tablet, Take 1 tablet (81 mg total) by mouth daily. Swallow whole., Disp: 30 tablet, Rfl: 0   atorvastatin (LIPITOR) 10 MG tablet, Take 10 mg by mouth daily., Disp: , Rfl:    buPROPion (WELLBUTRIN XL) 150 MG 24 hr tablet, Take 150 mg by mouth daily., Disp: , Rfl:    dicyclomine (BENTYL) 10 MG capsule, Take 10 mg by mouth 2 (two) times daily as needed., Disp: , Rfl:  DULoxetine (CYMBALTA) 60 MG capsule, Take 60 mg by mouth daily., Disp: , Rfl:    etodolac (LODINE) 400 MG tablet, Take 400 mg by mouth 2 (two) times daily., Disp: , Rfl:    famotidine (PEPCID) 40 MG tablet, Take 40 mg by mouth at bedtime., Disp: , Rfl:    folic acid (FOLVITE) 1 MG tablet, Take 1 tablet by mouth daily., Disp: , Rfl:    galantamine (RAZADYNE) 8 MG tablet, Take 8 mg by mouth daily., Disp: , Rfl:    gentamicin ointment (GARAMYCIN) 0.1 %, Apply 1 application. topically 3 (three) times daily., Disp: , Rfl:    levothyroxine (SYNTHROID) 50 MCG tablet, Take 50 mcg by mouth daily. , Disp: , Rfl:    lidocaine (LIDODERM) 5 %, Place 1 patch onto the skin every 12 (twelve) hours. Remove & Discard patch within 12 hours or as directed by MD, Disp: 10 patch, Rfl: 0   methotrexate 250 MG/10ML injection, ADMINISTER 0.8 ML ONCE A WEEK, Disp: , Rfl:    omeprazole (PRILOSEC) 40 MG capsule, Take 40 mg by mouth daily., Disp: , Rfl:    ondansetron (ZOFRAN-ODT) 4 MG disintegrating tablet, Take 1 tablet (4 mg total) by mouth every 8 (eight) hours as needed for  nausea or vomiting., Disp: 12 tablet, Rfl: 0   tiZANidine (ZANAFLEX) 2 MG tablet, Take 2 mg by mouth 2 (two) times daily., Disp: , Rfl:    triamcinolone (NASACORT) 55 MCG/ACT AERO nasal inhaler, Place 2 sprays into the nose daily., Disp: , Rfl:    Physical exam:  Vitals:   10/21/21 1349  BP: 96/63  Pulse: 95  Resp: 18  Temp: 98.1 F (36.7 C)  SpO2: 98%  Weight: 90 lb 8 oz (41.1 kg)  Height: '5\' 4"'$  (1.626 m)   Physical Exam Cardiovascular:     Rate and Rhythm: Normal rate and regular rhythm.     Heart sounds: Normal heart sounds.  Pulmonary:     Effort: Pulmonary effort is normal.     Breath sounds: Normal breath sounds.  Abdominal:     General: Bowel sounds are normal.     Palpations: Abdomen is soft.  Skin:    General: Skin is warm and dry.  Neurological:     Mental Status: She is alert and oriented to person, place, and time.          Latest Ref Rng & Units 10/12/2021    5:25 AM  CMP  Glucose 70 - 99 mg/dL 100    BUN 8 - 23 mg/dL 23    Creatinine 0.44 - 1.00 mg/dL 0.96    Sodium 135 - 145 mmol/L 137    Potassium 3.5 - 5.1 mmol/L 3.6    Chloride 98 - 111 mmol/L 105    CO2 22 - 32 mmol/L 26    Calcium 8.9 - 10.3 mg/dL 8.1        Latest Ref Rng & Units 10/12/2021    5:25 AM  CBC  WBC 4.0 - 10.5 K/uL 5.2    Hemoglobin 12.0 - 15.0 g/dL 8.0    Hematocrit 36.0 - 46.0 % 26.3    Platelets 150 - 400 K/uL 198      No images are attached to the encounter.  DG Chest 2 View  Result Date: 10/11/2021 CLINICAL DATA:  Abdominal pain and nausea. EXAM: CHEST - 2 VIEW COMPARISON:  Chest radiograph dated 12/07/2019. FINDINGS: No focal consolidation, pleural effusion, or pneumothorax. Background of emphysema. The cardiac silhouette is within  limits. Atherosclerotic calcification of the aorta. Osteopenia with degenerative changes of the spine. No acute osseous pathology. IMPRESSION: No active cardiopulmonary disease. Electronically Signed   By: Anner Crete M.D.   On: 10/11/2021  21:30   CT Head Wo Contrast  Result Date: 10/11/2021 CLINICAL DATA:  Neurologic deficit.  Concern for stroke. EXAM: CT HEAD WITHOUT CONTRAST CT CERVICAL SPINE WITHOUT CONTRAST TECHNIQUE: Multidetector CT imaging of the head and cervical spine was performed following the standard protocol without intravenous contrast. Multiplanar CT image reconstructions of the cervical spine were also generated. RADIATION DOSE REDUCTION: This exam was performed according to the departmental dose-optimization program which includes automated exposure control, adjustment of the mA and/or kV according to patient size and/or use of iterative reconstruction technique. COMPARISON:  Head CT dated 10/02/2020. FINDINGS: CT HEAD FINDINGS Brain: Mild age-related atrophy and chronic microvascular ischemic changes. There is no acute intracranial hemorrhage. No mass effect or midline shift. No extra-axial fluid collection. Vascular: No hyperdense vessel or unexpected calcification. Skull: Normal. Negative for fracture or focal lesion. Sinuses/Orbits: No acute finding. Other: None CT CERVICAL SPINE FINDINGS Alignment: No acute subluxation. There is reversal of normal cervical lordosis which may be positional or due to muscle spasm. Skull base and vertebrae: No acute fracture.  Osteopenia. Soft tissues and spinal canal: No prevertebral fluid or swelling. No visible canal hematoma. Disc levels: Multilevel degenerative changes with disc space narrowing and endplate irregularity most prominent at C5-C6 and C6-C7. Upper chest: Biapical subpleural scarring. Other: Left carotid bulb calcified plaques. IMPRESSION: 1. No acute intracranial pathology. Mild age-related atrophy and chronic microvascular ischemic changes. 2. No acute fracture or subluxation of the cervical spine. Multilevel degenerative changes. Electronically Signed   By: Anner Crete M.D.   On: 10/11/2021 23:32   CT Cervical Spine Wo Contrast  Result Date: 10/11/2021 CLINICAL DATA:   Neurologic deficit.  Concern for stroke. EXAM: CT HEAD WITHOUT CONTRAST CT CERVICAL SPINE WITHOUT CONTRAST TECHNIQUE: Multidetector CT imaging of the head and cervical spine was performed following the standard protocol without intravenous contrast. Multiplanar CT image reconstructions of the cervical spine were also generated. RADIATION DOSE REDUCTION: This exam was performed according to the departmental dose-optimization program which includes automated exposure control, adjustment of the mA and/or kV according to patient size and/or use of iterative reconstruction technique. COMPARISON:  Head CT dated 10/02/2020. FINDINGS: CT HEAD FINDINGS Brain: Mild age-related atrophy and chronic microvascular ischemic changes. There is no acute intracranial hemorrhage. No mass effect or midline shift. No extra-axial fluid collection. Vascular: No hyperdense vessel or unexpected calcification. Skull: Normal. Negative for fracture or focal lesion. Sinuses/Orbits: No acute finding. Other: None CT CERVICAL SPINE FINDINGS Alignment: No acute subluxation. There is reversal of normal cervical lordosis which may be positional or due to muscle spasm. Skull base and vertebrae: No acute fracture.  Osteopenia. Soft tissues and spinal canal: No prevertebral fluid or swelling. No visible canal hematoma. Disc levels: Multilevel degenerative changes with disc space narrowing and endplate irregularity most prominent at C5-C6 and C6-C7. Upper chest: Biapical subpleural scarring. Other: Left carotid bulb calcified plaques. IMPRESSION: 1. No acute intracranial pathology. Mild age-related atrophy and chronic microvascular ischemic changes. 2. No acute fracture or subluxation of the cervical spine. Multilevel degenerative changes. Electronically Signed   By: Anner Crete M.D.   On: 10/11/2021 23:32   CT Abdomen Pelvis W Contrast  Result Date: 10/11/2021 CLINICAL DATA:  Acute abdominal pain. Nausea and generalized weakness. EXAM: CT  ABDOMEN AND PELVIS WITH CONTRAST  TECHNIQUE: Multidetector CT imaging of the abdomen and pelvis was performed using the standard protocol following bolus administration of intravenous contrast. RADIATION DOSE REDUCTION: This exam was performed according to the departmental dose-optimization program which includes automated exposure control, adjustment of the mA and/or kV according to patient size and/or use of iterative reconstruction technique. CONTRAST:  162m OMNIPAQUE IOHEXOL 300 MG/ML  SOLN COMPARISON:  CT 12/07/2019 FINDINGS: Lower chest: Emphysema without focal airspace disease. There is wall thickening of the distal esophagus. Hepatobiliary: No focal hepatic lesion. Cholecystectomy. There is mild central intrahepatic biliary ductal dilatation. Normal common bile duct at 6 mm. No visualized choledocholithiasis. Pancreas: No ductal dilatation or inflammation. Spleen: Normal in size without focal abnormality. Adrenals/Urinary Tract: No adrenal nodule. No hydronephrosis or perinephric edema. Homogeneous renal enhancement with symmetric excretion on delayed phase imaging. Small cyst from the inferior left kidney, needing no further follow-up. Urinary bladder is physiologically distended without wall thickening. Stomach/Bowel: Detailed bowel assessment is limited in the absence of enteric contrast and paucity of intra-abdominal fat. Distal esophageal wall thickening. Small amount of fluid in the stomach with equivocal gastric hyperemia. There is no gastric wall thickening. Majority of the small bowel appears decompressed. There is no obstruction. Floppy cecum located in the right mid abdomen. The appendix is not visualized, there is no evidence of appendicitis. Suggestion of mild colonic wall thickening extending from the hepatic flexure distally through the sigmoid colon. No definite pericolonic edema. There is sigmoid diverticulosis without focal diverticulitis. Vascular/Lymphatic: Advanced aortic  atherosclerosis. No aortic aneurysm. Patent portal, splenic, and mesenteric veins retroaortic left renal vein. There is no bulky abdominopelvic adenopathy. Reproductive: Quiescent uterus, normal for age.  No adnexal mass. Other: Trace free fluid in the pelvis, typically reactive. No free air or focal fluid collection. No abdominal wall hernia. Musculoskeletal: Surgical hardware in the right proximal femur. Chronic slight L3 superior endplate compression deformity. The bones are under mineralized. No acute osseous findings. IMPRESSION: 1. Suggestion of mild colonic wall thickening extending from the hepatic flexure distally through the sigmoid colon. There is no pericolonic edema. This may represent colitis versus nondistention. 2. Distal esophageal wall thickening, suspicious for esophagitis. 3. Sigmoid diverticulosis without focal diverticulitis. 4. Cholecystectomy with mild central intrahepatic biliary ductal dilatation, likely secondary to prior cholecystectomy. Normal common bile duct at 6 mm. Aortic Atherosclerosis (ICD10-I70.0) and Emphysema (ICD10-J43.9). Electronically Signed   By: MKeith RakeM.D.   On: 10/11/2021 23:33    Assessment and plan- Patient is a 81y.o. female referred for normocytic anemia likely secondary to iron deficiency  Patient's hemoglobin has been gradually drifting down from a baseline of 12 in July 2022 presently to 8.9.  B12 folate TSH within normal limits.  Ferritin was not checked but iron panel showed TIBC that was high normal at 408 with an iron saturation of 8% suggestive of iron deficiency.  She will benefit from IV iron.  Discussed risks and benefits of IV iron including all but not limited to possibility of anaphylactic reaction.  Depending on what her insurance approves she will either get Feraheme x2 or Venofer x5.  We will repeat CBC ferritin and iron studies in 2 months and see her thereafter  With regards to etiology of iron deficiency anemiaPatient has not had  any recent EGD or colonoscopy. Patient does not desire any GI work up at this time but if her iron deficiency anemia recurs she is willing to consider it    Thank you for this kind referral and the  opportunity to participate in the care of this patient   Visit Diagnosis 1. Iron deficiency anemia, unspecified iron deficiency anemia type   2. Normocytic anemia     Dr. Randa Evens, MD, MPH [facility] [phone] 10/21/2021

## 2021-10-22 MED FILL — Ferumoxytol Inj 510 MG/17ML (30 MG/ML) (Elemental Fe): INTRAVENOUS | Qty: 17 | Status: AC

## 2021-10-23 ENCOUNTER — Inpatient Hospital Stay: Payer: Medicare Other

## 2021-10-24 ENCOUNTER — Encounter: Payer: Self-pay | Admitting: Oncology

## 2021-10-28 ENCOUNTER — Inpatient Hospital Stay: Payer: Medicare Other

## 2021-10-28 ENCOUNTER — Telehealth: Payer: Self-pay | Admitting: *Deleted

## 2021-10-28 VITALS — BP 136/74 | HR 84 | Temp 98.0°F | Resp 20

## 2021-10-28 DIAGNOSIS — D509 Iron deficiency anemia, unspecified: Secondary | ICD-10-CM

## 2021-10-28 MED ORDER — SODIUM CHLORIDE 0.9 % IV SOLN
Freq: Once | INTRAVENOUS | Status: AC
  Filled 2021-10-28: qty 250

## 2021-10-28 MED ORDER — SODIUM CHLORIDE 0.9 % IV SOLN
510.0000 mg | INTRAVENOUS | Status: DC
  Administered 2021-10-28: 510 mg via INTRAVENOUS
  Filled 2021-10-28: qty 510

## 2021-10-28 NOTE — Telephone Encounter (Signed)
Push iron out by 2 weeks

## 2021-10-28 NOTE — Telephone Encounter (Signed)
I looked in lexicomp from up to date and the medication simponi aria and feraheme, and augmentin. There is not interaction with these meds. So if pt wants to get the feraheme it is ok. Pt agreeable.

## 2021-10-28 NOTE — Patient Instructions (Signed)
[facility]  Discharge Instructions: ?Thank you for choosing [facility] to provide your oncology and hematology care.  ?If you have a lab appointment with the Cancer Center, please go directly to the Cancer Center and check in at the registration area. ? ?Wear comfortable clothing and clothing appropriate for easy access to any Portacath or PICC line.  ? ?We strive to give you quality time with your provider. You may need to reschedule your appointment if you arrive late (15 or more minutes).  Arriving late affects you and other patients whose appointments are after yours.  Also, if you miss three or more appointments without notifying the office, you may be dismissed from the clinic at the provider?s discretion.    ?  ?For prescription refill requests, have your pharmacy contact our office and allow 72 hours for refills to be completed.   ? ?  ?To help prevent nausea and vomiting after your treatment, we encourage you to take your nausea medication as directed. ? ?BELOW ARE SYMPTOMS THAT SHOULD BE REPORTED IMMEDIATELY: ?*FEVER GREATER THAN 100.4 F (38 ?C) OR HIGHER ?*CHILLS OR SWEATING ?*NAUSEA AND VOMITING THAT IS NOT CONTROLLED WITH YOUR NAUSEA MEDICATION ?*UNUSUAL SHORTNESS OF BREATH ?*UNUSUAL BRUISING OR BLEEDING ?*URINARY PROBLEMS (pain or burning when urinating, or frequent urination) ?*BOWEL PROBLEMS (unusual diarrhea, constipation, pain near the anus) ?TENDERNESS IN MOUTH AND THROAT WITH OR WITHOUT PRESENCE OF ULCERS (sore throat, sores in mouth, or a toothache) ?UNUSUAL RASH, SWELLING OR PAIN  ?UNUSUAL VAGINAL DISCHARGE OR ITCHING  ? ?Items with * indicate a potential emergency and should be followed up as soon as possible or go to the Emergency Department if any problems should occur. ? ?Please show the CHEMOTHERAPY ALERT CARD or IMMUNOTHERAPY ALERT CARD at check-in to the Emergency Department and triage nurse. ? ?Should you have questions after your visit  or need to cancel or reschedule your appointment, please contact [facility]  [phone] and follow the prompts.  Office hours are 8:00 a.m. to 4:30 p.m. Monday - Friday. Please note that voicemails left after 4:00 p.m. may not be returned until the following business day.  We are closed weekends and major holidays. You have access to a nurse at all times for urgent questions. Please call the main number to the clinic [phone] and follow the prompts. ? ?For any non-urgent questions, you may also contact your provider using MyChart. We now offer e-Visits for anyone 18 and older to request care online for non-urgent symptoms. For details visit [email or web address]. ?  ?Also download the MyChart app! Go to the app store, search "MyChart", open the app, select [facility], and log in with your MyChart username and password. ? ?Due to Covid, a mask is required upon entering the hospital/clinic. If you do not have a mask, one will be given to you upon arrival. For doctor visits, patients may have 1 support person aged 18 or older with them. For treatment visits, patients cannot have anyone with them due to current Covid guidelines and our immunocompromised population.  ?

## 2021-10-28 NOTE — Telephone Encounter (Signed)
Pt came and there is no interactions for the simponi aria, and feraheme and  augmentin. I asked the pt if she wants to get iron today . She is ok with getting it

## 2021-10-28 NOTE — Telephone Encounter (Signed)
Patient called asking if she should keep her appointment today for infusion stating that she sis on antibiotics and an infusion from Dr Cindie Laroche Jefm Bryant right now. She is scheduled for IV Iron today. Please advise  Patient Instructions - documented in this encounter Patient Instructions Donnamarie Rossetti, PA - 10/22/2021 2:00 PM EDT   Formatting of this note might be different from the original. Start Augmentin for any bacterial infections.  Start prednisone to help with airway inflammation.  Would hold your methotrexate for 1 week.  Hydrate well and call if not improving. Electronically signed by Donnamarie Rossetti, PA at 10/22/2021 2:37 PM EDT   Dr Lavonne Chick office nursing notes: MD order prior to infusion:  Pt Wt:  (!) 40.4 kg (89 lb)   Vials of Simponi Aria 12.'5mg'$ /ml in 103m vial used 2 Total Dose '81mg'$   Amount injected into 100cc normal saline bag 6.574m Amount of wastage 1.26m13mIV Insertion: Time: Gauge: '[x]'$  22 '[]'$  24 Length: '[]'$  3/4" '[]'$  1" Location: '[]'$  Right '[x]'$  Left Site: '[]'$  Hand '[]'$  Wrist '[x]'$  Forearm '[]'$  Anticubital Good Blood Return: '[x]'$  Yes '[]'$  No  Number of Attempts: 2   NDC: [id number] Piant #: [id number] Date: 03/06/2024  Start Time: [id number]

## 2021-11-11 ENCOUNTER — Inpatient Hospital Stay: Payer: Medicare Other | Attending: Oncology

## 2021-11-11 VITALS — BP 128/71 | HR 89 | Temp 98.2°F | Resp 18

## 2021-11-11 DIAGNOSIS — D509 Iron deficiency anemia, unspecified: Secondary | ICD-10-CM | POA: Diagnosis present

## 2021-11-11 MED ORDER — SODIUM CHLORIDE 0.9 % IV SOLN
510.0000 mg | INTRAVENOUS | Status: DC
  Administered 2021-11-11: 510 mg via INTRAVENOUS
  Filled 2021-11-11: qty 17

## 2021-11-11 MED ORDER — SODIUM CHLORIDE 0.9 % IV SOLN
Freq: Once | INTRAVENOUS | Status: AC
  Filled 2021-11-11: qty 250

## 2021-11-11 NOTE — Patient Instructions (Signed)
[facility] AT [place]  Discharge Instructions: Thank you for choosing [facility] to provide your oncology and hematology care.  If you have a lab appointment with the [facility], please go directly to the [facility] and check in at the registration area.  Wear comfortable clothing and clothing appropriate for easy access to any Portacath or PICC line.   We strive to give you quality time with your provider. You may need to reschedule your appointment if you arrive late (15 or more minutes).  Arriving late affects you and other patients whose appointments are after yours.  Also, if you miss three or more appointments without notifying the office, you may be dismissed from the clinic at the provider's discretion.      For prescription refill requests, have your pharmacy contact our office and allow 72 hours for refills to be completed.    Today you received the following chemotherapy and/or immunotherapy agents FERAHEME      To help prevent nausea and vomiting after your treatment, we encourage you to take your nausea medication as directed.  BELOW ARE SYMPTOMS THAT SHOULD BE REPORTED IMMEDIATELY: *FEVER GREATER THAN 100.4 F (38 C) OR HIGHER *CHILLS OR SWEATING *NAUSEA AND VOMITING THAT IS NOT CONTROLLED WITH YOUR NAUSEA MEDICATION *UNUSUAL SHORTNESS OF BREATH *UNUSUAL BRUISING OR BLEEDING *URINARY PROBLEMS (pain or burning when urinating, or frequent urination) *BOWEL PROBLEMS (unusual diarrhea, constipation, pain near the anus) TENDERNESS IN MOUTH AND THROAT WITH OR WITHOUT PRESENCE OF ULCERS (sore throat, sores in mouth, or a toothache) UNUSUAL RASH, SWELLING OR PAIN  UNUSUAL VAGINAL DISCHARGE OR ITCHING   Items with * indicate a potential emergency and should be followed up as soon as possible or go to the Emergency Department if any problems should occur.  Please show the CHEMOTHERAPY ALERT CARD or IMMUNOTHERAPY ALERT CARD at check-in to  the Emergency Department and triage nurse.  Should you have questions after your visit or need to cancel or reschedule your appointment, please contact [facility] [place] AT [place]  [phone] and follow the prompts.  Office hours are 8:00 a.m. to 4:30 p.m. Monday - Friday. Please note that voicemails left after 4:00 p.m. may not be returned until the following business day.  We are closed weekends and major holidays. You have access to a nurse at all times for urgent questions. Please call the main number to the clinic [phone] and follow the prompts.  For any non-urgent questions, you may also contact your provider using MyChart. We now offer e-Visits for anyone 67 and older to request care online for non-urgent symptoms. For details visit [email or web address].   Also download the MyChart app! Go to the app store, search "MyChart", open the app, select [place], and log in with your MyChart username and password.  Due to Covid, a mask is required upon entering the hospital/clinic. If you do not have a mask, one will be given to you upon arrival. For doctor visits, patients may have 1 support person aged 42 or older with them. For treatment visits, patients cannot have anyone with them due to current Covid guidelines and our immunocompromised population.   Ferumoxytol Injection What is this medication? FERUMOXYTOL (FER ue MOX i tol) treats low levels of iron in your body (iron deficiency anemia). Iron is a mineral that plays an important role in making red blood cells, which carry oxygen from your lungs to the rest of your body. This medicine may be used for  other purposes; ask your health care provider or pharmacist if you have questions. COMMON BRAND NAME(S): Feraheme What should I tell my care team before I take this medication? They need to know if you have any of these conditions: Anemia not caused by low iron levels High levels of iron in the blood Magnetic  resonance imaging (MRI) test scheduled An unusual or allergic reaction to iron, other medications, foods, dyes, or preservatives Pregnant or trying to get pregnant Breast-feeding How should I use this medication? This medication is for injection into a vein. It is given in a hospital or clinic setting. Talk to your care team the use of this medication in children. Special care may be needed. Overdosage: If you think you have taken too much of this medicine contact a poison control center or emergency room at once. NOTE: This medicine is only for you. Do not share this medicine with others. What if I miss a dose? It is important not to miss your dose. Call your care team if you are unable to keep an appointment. What may interact with this medication? Other iron products This list may not describe all possible interactions. Give your health care provider a list of all the medicines, herbs, non-prescription drugs, or dietary supplements you use. Also tell them if you smoke, drink alcohol, or use illegal drugs. Some items may interact with your medicine. What should I watch for while using this medication? Visit your care team regularly. Tell your care team if your symptoms do not start to get better or if they get worse. You may need blood work done while you are taking this medication. You may need to follow a special diet. Talk to your care team. Foods that contain iron include: whole grains/cereals, dried fruits, beans, or peas, leafy green vegetables, and organ meats (liver, kidney). What side effects may I notice from receiving this medication? Side effects that you should report to your care team as soon as possible: Allergic reactions--skin rash, itching, hives, swelling of the face, lips, tongue, or throat Low blood pressure--dizziness, feeling faint or lightheaded, blurry vision Shortness of breath Side effects that usually do not require medical attention (report to your care team if  they continue or are bothersome): Flushing Headache Joint pain Muscle pain Nausea Pain, redness, or irritation at injection site This list may not describe all possible side effects. Call your doctor for medical advice about side effects. You may report side effects to FDA at 1-800-FDA-1088. Where should I keep my medication? This medication is given in a hospital or clinic and will not be stored at home. NOTE: This sheet is a summary. It may not cover all possible information. If you have questions about this medicine, talk to your doctor, pharmacist, or health care provider.  2023 Elsevier/Gold Standard (2020-10-17 00:00:00)

## 2021-12-23 ENCOUNTER — Encounter: Payer: Self-pay | Admitting: Oncology

## 2021-12-23 ENCOUNTER — Inpatient Hospital Stay: Payer: Medicare Other | Attending: Oncology

## 2021-12-23 ENCOUNTER — Inpatient Hospital Stay (HOSPITAL_BASED_OUTPATIENT_CLINIC_OR_DEPARTMENT_OTHER): Payer: Medicare Other | Admitting: Oncology

## 2021-12-23 VITALS — BP 112/83 | HR 78 | Temp 97.9°F | Resp 16 | Ht 64.0 in | Wt 88.2 lb

## 2021-12-23 DIAGNOSIS — Z79899 Other long term (current) drug therapy: Secondary | ICD-10-CM | POA: Diagnosis not present

## 2021-12-23 DIAGNOSIS — D638 Anemia in other chronic diseases classified elsewhere: Secondary | ICD-10-CM | POA: Diagnosis not present

## 2021-12-23 DIAGNOSIS — D509 Iron deficiency anemia, unspecified: Secondary | ICD-10-CM

## 2021-12-23 LAB — CBC WITH DIFFERENTIAL/PLATELET
Abs Immature Granulocytes: 0.01 10*3/uL (ref 0.00–0.07)
Basophils Absolute: 0 10*3/uL (ref 0.0–0.1)
Basophils Relative: 0 %
Eosinophils Absolute: 0.3 10*3/uL (ref 0.0–0.5)
Eosinophils Relative: 4 %
HCT: 33.3 % — ABNORMAL LOW (ref 36.0–46.0)
Hemoglobin: 10.6 g/dL — ABNORMAL LOW (ref 12.0–15.0)
Immature Granulocytes: 0 %
Lymphocytes Relative: 17 %
Lymphs Abs: 1.1 10*3/uL (ref 0.7–4.0)
MCH: 30.2 pg (ref 26.0–34.0)
MCHC: 31.8 g/dL (ref 30.0–36.0)
MCV: 94.9 fL (ref 80.0–100.0)
Monocytes Absolute: 0.6 10*3/uL (ref 0.1–1.0)
Monocytes Relative: 8 %
Neutro Abs: 4.6 10*3/uL (ref 1.7–7.7)
Neutrophils Relative %: 71 %
Platelets: 282 10*3/uL (ref 150–400)
RBC: 3.51 MIL/uL — ABNORMAL LOW (ref 3.87–5.11)
RDW: 16.6 % — ABNORMAL HIGH (ref 11.5–15.5)
WBC: 6.6 10*3/uL (ref 4.0–10.5)
nRBC: 0 % (ref 0.0–0.2)

## 2021-12-23 LAB — FERRITIN: Ferritin: 257 ng/mL (ref 11–307)

## 2021-12-23 LAB — IRON AND TIBC
Iron: 93 ug/dL (ref 28–170)
Saturation Ratios: 36 % — ABNORMAL HIGH (ref 10.4–31.8)
TIBC: 258 ug/dL (ref 250–450)
UIBC: 165 ug/dL

## 2021-12-23 NOTE — Progress Notes (Signed)
Pt feels tired - then says very fatigued. With her arthritis pain it is hard to say about fatigue cause. She also can't eat solid food due to her dental issue of does not have bottom teeth right now. Workin gon appt at [facility] for help with plan for new dentures but has issue about gums forher

## 2021-12-23 NOTE — Progress Notes (Signed)
Hematology/Oncology Consult note [facility]  [phone] [phone]  Patient Care Team: Rusty Aus, MD as PCP - General (Unknown Physician Specialty)   Name of the patient: [name]  [id number]  [birth year]   Date of visit: 12/23/21  Diagnosis-anemia multifactorial likely secondary to chronic disease and iron deficiency  Chief complaint/ Reason for visit-routine follow-up of anemia  Heme/Onc history: patient is a 81 year old female with a past medical history significant for hypertension hypothyroidism hyperlipidemia rheumatoid arthritis among other medical problems.  She has been referred for iron deficiency anemia.  Her recent CBC from 10/12/2021 showed white count of 5.2, H&H of 8/26.3 with an MCV of 86 and a platelet count of 198.  Folate levels were normal.  Iron studies showed a low iron saturation of 6%.  Reticulocyte count was low at 1.4%.  B12 levels normal at 368.  Looking back at her prior CBCs patient's baseline hemoglobin runs around 12 up until July 2022 and then started gradually drifting down to the tens in October 2022.  No recent ferritin levels checked.  TSH normal at 4.7.  Labs were suggestive of iron deficiency and patient received 2 doses of Feraheme in May 2023  Interval history-patient does not report any significant improvement in her fatigue or joint pain.   ECOG PS- 2 Pain scale- 4   Review of systems- Review of Systems  Constitutional:  Positive for malaise/fatigue. Negative for chills, fever and weight loss.  HENT:  Negative for congestion, ear discharge and nosebleeds.   Eyes:  Negative for blurred vision.  Respiratory:  Negative for cough, hemoptysis, sputum production, shortness of breath and wheezing.   Cardiovascular:  Negative for chest pain, palpitations, orthopnea and claudication.  Gastrointestinal:  Negative for abdominal pain, blood in stool, constipation, diarrhea, heartburn, melena,  nausea and vomiting.  Genitourinary:  Negative for dysuria, flank pain, frequency, hematuria and urgency.  Musculoskeletal:  Positive for joint pain. Negative for back pain and myalgias.  Skin:  Negative for rash.  Neurological:  Negative for dizziness, tingling, focal weakness, seizures, weakness and headaches.  Endo/Heme/Allergies:  Does not bruise/bleed easily.  Psychiatric/Behavioral:  Negative for depression and suicidal ideas. The patient does not have insomnia.       Allergies  Allergen Reactions   Other Other (See Comments)    Uncoded Allergy. Allergen: symmetral Uncoded Allergy. Allergen: etrafon Uncoded Allergy. Allergen: desbutal Uncoded Allergy. Allergen: symmetral Uncoded Allergy. Allergen: etrafon Uncoded Allergy. Allergen: desbutal Other reaction(s): Unknown Uncoded Allergy. Allergen: symmetral Other reaction(s): Unknown Uncoded Allergy. Allergen: etrafon Uncoded Allergy. Allergen: symmetral Uncoded Allergy. Allergen: etrafon Uncoded Allergy. Allergen: desbutal    Sulfa Antibiotics     Other reaction(s): Unknown Uncoded Allergy. Allergen: desbutal   Denosumab Itching   Etrafon [Perphenazine-Amitriptyline]    Hydrocodone Nausea And Vomiting   Mirtazapine     Other reaction(s): Hallucination   Doxycycline Nausea Only   Percocet [Oxycodone-Acetaminophen] Nausea And Vomiting   Sertraline Palpitations     Past Medical History:  Diagnosis Date   Abdominal pain    Anemia    Anxiety    Arthritis, rheumatoid (HCC)    Breast pain    Chest discomfort    Chest pain    Chronic pain syndrome    Collagen vascular disease (HCC)    hx rheumatoid arthritis.    Constipation    COPD (chronic obstructive pulmonary disease) (HCC)    Diverticulosis    Fibrocystic breast disease    Gastritis  GERD (gastroesophageal reflux disease)    Hemorrhoids    Hx of colonic polyps    Hypothyroidism    Osteopenia    Renal artery stenosis (HCC)    Right ankle pain     Thyroid binding globulin deficiency    Tobacco abuse      Past Surgical History:  Procedure Laterality Date   CHOLECYSTECTOMY     JOINT REPLACEMENT     right hip   LEG SURGERY     TONSILLECTOMY      Social History   Socioeconomic History   Marital status: Single    Spouse name: Not on file   Number of children: Not on file   Years of education: Not on file   Highest education level: Not on file  Occupational History   Not on file  Tobacco Use   Smoking status: Some Days    Packs/day: 0.10    Types: Cigarettes   Smokeless tobacco: Never   Tobacco comments:    Pt maybe has 1 cig a  month  Vaping Use   Vaping Use: Never used  Substance and Sexual Activity   Alcohol use: No   Drug use: No   Sexual activity: Not Currently  Other Topics Concern   Not on file  Social History Narrative   Not on file   Social Determinants of Health   Financial Resource Strain: Not on file  Food Insecurity: Not on file  Transportation Needs: Not on file  Physical Activity: Not on file  Stress: Not on file  Social Connections: Not on file  Intimate Partner Violence: Not on file    Family History  Problem Relation Age of Onset   Heart disease Sister    Hypertension Brother    Heart attack Brother    Hyperlipidemia Brother    Breast cancer Neg Hx      Current Outpatient Medications:    aspirin EC 81 MG EC tablet, Take 1 tablet (81 mg total) by mouth daily. Swallow whole., Disp: 30 tablet, Rfl: 0   atorvastatin (LIPITOR) 10 MG tablet, Take 10 mg by mouth daily., Disp: , Rfl:    buPROPion (WELLBUTRIN XL) 150 MG 24 hr tablet, Take 150 mg by mouth daily., Disp: , Rfl:    dicyclomine (BENTYL) 10 MG capsule, Take 10 mg by mouth 2 (two) times daily as needed., Disp: , Rfl:    DULoxetine (CYMBALTA) 60 MG capsule, Take 60 mg by mouth daily., Disp: , Rfl:    etodolac (LODINE) 400 MG tablet, Take 400 mg by mouth 2 (two) times daily., Disp: , Rfl:    folic acid (FOLVITE) 1 MG tablet, Take  1 tablet by mouth daily., Disp: , Rfl:    galantamine (RAZADYNE) 8 MG tablet, Take 8 mg by mouth daily., Disp: , Rfl:    levothyroxine (SYNTHROID) 50 MCG tablet, Take 50 mcg by mouth daily. , Disp: , Rfl:    methotrexate 250 MG/10ML injection, ADMINISTER 0.8 ML ONCE A WEEK, Disp: , Rfl:    omeprazole (PRILOSEC) 40 MG capsule, Take 40 mg by mouth in the morning and at bedtime., Disp: , Rfl:    ondansetron (ZOFRAN-ODT) 4 MG disintegrating tablet, Take 1 tablet (4 mg total) by mouth every 8 (eight) hours as needed for nausea or vomiting., Disp: 12 tablet, Rfl: 0   tiZANidine (ZANAFLEX) 2 MG tablet, Take 2 mg by mouth 2 (two) times daily., Disp: , Rfl:    lidocaine (LIDODERM) 5 %, Place 1 patch onto the  skin every 12 (twelve) hours. Remove & Discard patch within 12 hours or as directed by MD (Patient not taking: Reported on 12/23/2021), Disp: 10 patch, Rfl: 0  Physical exam:  Vitals:   12/23/21 1332  BP: 112/83  Pulse: 78  Resp: 16  Temp: 97.9 F (36.6 C)  TempSrc: Oral  Weight: 88 lb 3.2 oz (40 kg)  Height: _0  (1.626 m)   Physical Exam Constitutional:      General: She is not in acute distress. Cardiovascular:     Rate and Rhythm: Normal rate and regular rhythm.     Heart sounds: Normal heart sounds.  Pulmonary:     Effort: Pulmonary effort is normal.     Breath sounds: Normal breath sounds.  Abdominal:     General: Bowel sounds are normal.     Palpations: Abdomen is soft.  Musculoskeletal:     Comments: Joint deformity is noted and small joints of bilateral hands  Skin:    General: Skin is warm and dry.  Neurological:     Mental Status: She is alert and oriented to person, place, and time.         Latest Ref Rng & Units 10/12/2021    5:25 AM  CMP  Glucose 70 - 99 mg/dL 100   BUN 8 - 23 mg/dL 23   Creatinine 0.44 - 1.00 mg/dL 0.96   Sodium 135 - 145 mmol/L 137   Potassium 3.5 - 5.1 mmol/L 3.6   Chloride 98 - 111 mmol/L 105   CO2 22 - 32 mmol/L 26   Calcium 8.9 - 10.3  mg/dL 8.1       Latest Ref Rng & Units 12/23/2021   12:42 PM  CBC  WBC 4.0 - 10.5 K/uL 6.6   Hemoglobin 12.0 - 15.0 g/dL 10.6   Hematocrit 36.0 - 46.0 % 33.3   Platelets 150 - 400 K/uL 282     No images are attached to the encounter.  No results found.   Assessment and plan- Patient is a 81 y.o. female with normocytic anemia likely multifactorial secondary to anemia of chronic disease and iron deficiency  After receiving IV iron patient's hemoglobin has improved from 8-10.6.  Iron studies are presently pending.  Patient does not report any significant improvement in her symptoms despite receiving IV iron.  She also probably has a component of anemia of chronic disease given her underlying rheumatoid arthritis and recently elevated ESR CRP as well.  Hold off on any further IV iron at this time.  CBC ferritin and iron studies in 3 in 6 months and I will see her back in 6 months   Visit Diagnosis 1. Iron deficiency anemia, unspecified iron deficiency anemia type   2. Anemia of chronic disease      Dr. Randa Evens, MD, MPH [facility] [phone] 12/23/2021 2:21 PM

## 2022-01-21 ENCOUNTER — Inpatient Hospital Stay
Admission: EM | Admit: 2022-01-21 | Discharge: 2022-01-27 | DRG: 982 | Disposition: A | Discharge status: Skilled Nursing Facility | Payer: Medicare Other | Attending: Osteopathic Medicine | Admitting: Osteopathic Medicine

## 2022-01-21 ENCOUNTER — Emergency Department: Payer: Medicare Other

## 2022-01-21 ENCOUNTER — Other Ambulatory Visit: Payer: Self-pay

## 2022-01-21 DIAGNOSIS — K921 Melena: Principal | ICD-10-CM | POA: Diagnosis present

## 2022-01-21 DIAGNOSIS — Z79899 Other long term (current) drug therapy: Secondary | ICD-10-CM

## 2022-01-21 DIAGNOSIS — K219 Gastro-esophageal reflux disease without esophagitis: Secondary | ICD-10-CM | POA: Diagnosis present

## 2022-01-21 DIAGNOSIS — M542 Cervicalgia: Secondary | ICD-10-CM | POA: Diagnosis present

## 2022-01-21 DIAGNOSIS — F17210 Nicotine dependence, cigarettes, uncomplicated: Secondary | ICD-10-CM | POA: Diagnosis present

## 2022-01-21 DIAGNOSIS — Z720 Tobacco use: Secondary | ICD-10-CM | POA: Diagnosis present

## 2022-01-21 DIAGNOSIS — Z96641 Presence of right artificial hip joint: Secondary | ICD-10-CM | POA: Diagnosis present

## 2022-01-21 DIAGNOSIS — Z8719 Personal history of other diseases of the digestive system: Secondary | ICD-10-CM

## 2022-01-21 DIAGNOSIS — Z9049 Acquired absence of other specified parts of digestive tract: Secondary | ICD-10-CM | POA: Diagnosis not present

## 2022-01-21 DIAGNOSIS — Z885 Allergy status to narcotic agent status: Secondary | ICD-10-CM

## 2022-01-21 DIAGNOSIS — Z681 Body mass index (BMI) 19 or less, adult: Secondary | ICD-10-CM

## 2022-01-21 DIAGNOSIS — I700 Atherosclerosis of aorta: Secondary | ICD-10-CM | POA: Diagnosis not present

## 2022-01-21 DIAGNOSIS — R571 Hypovolemic shock: Secondary | ICD-10-CM | POA: Diagnosis not present

## 2022-01-21 DIAGNOSIS — Z8249 Family history of ischemic heart disease and other diseases of the circulatory system: Secondary | ICD-10-CM

## 2022-01-21 DIAGNOSIS — D62 Acute posthemorrhagic anemia: Secondary | ICD-10-CM | POA: Diagnosis present

## 2022-01-21 DIAGNOSIS — Z79631 Long term (current) use of antimetabolite agent: Secondary | ICD-10-CM | POA: Diagnosis not present

## 2022-01-21 DIAGNOSIS — R579 Shock, unspecified: Secondary | ICD-10-CM

## 2022-01-21 DIAGNOSIS — M059 Rheumatoid arthritis with rheumatoid factor, unspecified: Secondary | ICD-10-CM | POA: Diagnosis not present

## 2022-01-21 DIAGNOSIS — Z888 Allergy status to other drugs, medicaments and biological substances status: Secondary | ICD-10-CM

## 2022-01-21 DIAGNOSIS — Z882 Allergy status to sulfonamides status: Secondary | ICD-10-CM

## 2022-01-21 DIAGNOSIS — E861 Hypovolemia: Secondary | ICD-10-CM | POA: Diagnosis present

## 2022-01-21 DIAGNOSIS — K922 Gastrointestinal hemorrhage, unspecified: Secondary | ICD-10-CM | POA: Diagnosis not present

## 2022-01-21 DIAGNOSIS — F419 Anxiety disorder, unspecified: Secondary | ICD-10-CM | POA: Diagnosis present

## 2022-01-21 DIAGNOSIS — I9589 Other hypotension: Secondary | ICD-10-CM

## 2022-01-21 DIAGNOSIS — G894 Chronic pain syndrome: Secondary | ICD-10-CM | POA: Diagnosis present

## 2022-01-21 DIAGNOSIS — I959 Hypotension, unspecified: Secondary | ICD-10-CM | POA: Diagnosis present

## 2022-01-21 DIAGNOSIS — Z83438 Family history of other disorder of lipoprotein metabolism and other lipidemia: Secondary | ICD-10-CM

## 2022-01-21 DIAGNOSIS — J449 Chronic obstructive pulmonary disease, unspecified: Secondary | ICD-10-CM | POA: Diagnosis present

## 2022-01-21 DIAGNOSIS — Z87891 Personal history of nicotine dependence: Secondary | ICD-10-CM | POA: Diagnosis not present

## 2022-01-21 DIAGNOSIS — E785 Hyperlipidemia, unspecified: Secondary | ICD-10-CM | POA: Diagnosis present

## 2022-01-21 DIAGNOSIS — I701 Atherosclerosis of renal artery: Secondary | ICD-10-CM | POA: Diagnosis present

## 2022-01-21 DIAGNOSIS — R636 Underweight: Secondary | ICD-10-CM | POA: Diagnosis present

## 2022-01-21 DIAGNOSIS — E039 Hypothyroidism, unspecified: Secondary | ICD-10-CM | POA: Diagnosis present

## 2022-01-21 DIAGNOSIS — R531 Weakness: Secondary | ICD-10-CM | POA: Diagnosis present

## 2022-01-21 DIAGNOSIS — Z66 Do not resuscitate: Secondary | ICD-10-CM | POA: Diagnosis present

## 2022-01-21 DIAGNOSIS — Z79890 Hormone replacement therapy (postmenopausal): Secondary | ICD-10-CM

## 2022-01-21 DIAGNOSIS — R262 Difficulty in walking, not elsewhere classified: Secondary | ICD-10-CM | POA: Diagnosis present

## 2022-01-21 DIAGNOSIS — I771 Stricture of artery: Secondary | ICD-10-CM | POA: Diagnosis not present

## 2022-01-21 DIAGNOSIS — Z7985 Long-term (current) use of injectable non-insulin antidiabetic drugs: Secondary | ICD-10-CM

## 2022-01-21 DIAGNOSIS — D500 Iron deficiency anemia secondary to blood loss (chronic): Secondary | ICD-10-CM | POA: Diagnosis not present

## 2022-01-21 DIAGNOSIS — K2981 Duodenitis with bleeding: Secondary | ICD-10-CM | POA: Diagnosis not present

## 2022-01-21 DIAGNOSIS — M069 Rheumatoid arthritis, unspecified: Secondary | ICD-10-CM | POA: Diagnosis present

## 2022-01-21 HISTORY — DX: Other specified postprocedural states: R11.2

## 2022-01-21 HISTORY — DX: Nausea with vomiting, unspecified: Z98.890

## 2022-01-21 LAB — CBC WITH DIFFERENTIAL/PLATELET
Abs Immature Granulocytes: 0.06 10*3/uL (ref 0.00–0.07)
Basophils Absolute: 0 10*3/uL (ref 0.0–0.1)
Basophils Relative: 0 %
Eosinophils Absolute: 0.2 10*3/uL (ref 0.0–0.5)
Eosinophils Relative: 2 %
HCT: 14.2 % — CL (ref 36.0–46.0)
Hemoglobin: 4.5 g/dL — CL (ref 12.0–15.0)
Immature Granulocytes: 1 %
Lymphocytes Relative: 12 %
Lymphs Abs: 1 10*3/uL (ref 0.7–4.0)
MCH: 31.5 pg (ref 26.0–34.0)
MCHC: 31.7 g/dL (ref 30.0–36.0)
MCV: 99.3 fL (ref 80.0–100.0)
Monocytes Absolute: 0.8 10*3/uL (ref 0.1–1.0)
Monocytes Relative: 9 %
Neutro Abs: 6.6 10*3/uL (ref 1.7–7.7)
Neutrophils Relative %: 76 %
Platelets: 203 10*3/uL (ref 150–400)
RBC: 1.43 MIL/uL — ABNORMAL LOW (ref 3.87–5.11)
RDW: 16.9 % — ABNORMAL HIGH (ref 11.5–15.5)
WBC: 8.6 10*3/uL (ref 4.0–10.5)
nRBC: 0 % (ref 0.0–0.2)

## 2022-01-21 LAB — MRSA NEXT GEN BY PCR, NASAL: MRSA by PCR Next Gen: NOT DETECTED

## 2022-01-21 LAB — COMPREHENSIVE METABOLIC PANEL
ALT: 13 U/L (ref 0–44)
AST: 19 U/L (ref 15–41)
Albumin: 2.5 g/dL — ABNORMAL LOW (ref 3.5–5.0)
Alkaline Phosphatase: 41 U/L (ref 38–126)
Anion gap: 5 (ref 5–15)
BUN: 39 mg/dL — ABNORMAL HIGH (ref 8–23)
CO2: 24 mmol/L (ref 22–32)
Calcium: 7.5 mg/dL — ABNORMAL LOW (ref 8.9–10.3)
Chloride: 106 mmol/L (ref 98–111)
Creatinine, Ser: 0.8 mg/dL (ref 0.44–1.00)
GFR, Estimated: >60 mL/min (ref >60)
Glucose, Bld: 93 mg/dL (ref 70–99)
Potassium: 3.6 mmol/L (ref 3.5–5.1)
Sodium: 135 mmol/L (ref 135–145)
Total Bilirubin: 0.5 mg/dL (ref 0.3–1.2)
Total Protein: 4.8 g/dL — ABNORMAL LOW (ref 6.5–8.1)

## 2022-01-21 LAB — PREPARE RBC (CROSSMATCH)

## 2022-01-21 LAB — GLUCOSE, CAPILLARY: Glucose-Capillary: 82 mg/dL (ref 70–99)

## 2022-01-21 LAB — APTT: aPTT: 29 seconds (ref 24–36)

## 2022-01-21 LAB — LACTIC ACID, PLASMA
Lactic Acid, Venous: 3.4 mmol/L (ref 0.5–1.9)
Lactic Acid, Venous: 1.1 mmol/L (ref 0.5–1.9)

## 2022-01-21 LAB — PROTIME-INR
INR: 1.4 — ABNORMAL HIGH (ref 0.8–1.2)
Prothrombin Time: 17.1 seconds — ABNORMAL HIGH (ref 11.4–15.2)

## 2022-01-21 LAB — ABO/RH: ABO/RH(D): O POS

## 2022-01-21 MED ORDER — LACTATED RINGERS IV BOLUS (SEPSIS)
1000.0000 mL | Freq: Once | INTRAVENOUS | Status: AC
Start: 2022-01-21 — End: 2022-01-21
  Administered 2022-01-21: 1000 mL via INTRAVENOUS

## 2022-01-21 MED ORDER — LEVOTHYROXINE SODIUM 50 MCG PO TABS
50.0000 ug | ORAL_TABLET | Freq: Every day | ORAL | Status: DC
  Administered 2022-01-22 – 2022-01-27 (×5): 50 ug via ORAL
  Filled 2022-01-21 (×6): qty 1

## 2022-01-21 MED ORDER — LACTATED RINGERS IV SOLN
INTRAVENOUS | Status: DC
Start: 2022-01-21 — End: 2022-01-23

## 2022-01-21 MED ORDER — PANTOPRAZOLE SODIUM 40 MG IV SOLR
40.0000 mg | Freq: Two times a day (BID) | INTRAVENOUS | Status: DC
  Administered 2022-01-21 – 2022-01-26 (×11): 40 mg via INTRAVENOUS
  Filled 2022-01-21 (×11): qty 10

## 2022-01-21 MED ORDER — SODIUM CHLORIDE 0.9 % IV SOLN
10.0000 mL/h | Freq: Once | INTRAVENOUS | Status: AC
  Administered 2022-01-21: 10 mL/h via INTRAVENOUS

## 2022-01-21 MED ORDER — VANCOMYCIN HCL IN DEXTROSE 1-5 GM/200ML-% IV SOLN
1000.0000 mg | Freq: Once | INTRAVENOUS | Status: AC
  Administered 2022-01-21: 1000 mg via INTRAVENOUS
  Filled 2022-01-21: qty 200

## 2022-01-21 MED ORDER — NICOTINE 14 MG/24HR TD PT24
14.0000 mg | MEDICATED_PATCH | Freq: Every day | TRANSDERMAL | Status: DC
  Administered 2022-01-22 – 2022-01-27 (×6): 14 mg via TRANSDERMAL
  Filled 2022-01-21 (×6): qty 1

## 2022-01-21 MED ORDER — PIPERACILLIN-TAZOBACTAM 3.375 G IVPB
3.3750 g | Freq: Three times a day (TID) | INTRAVENOUS | Status: DC
  Administered 2022-01-22 (×2): 3.375 g via INTRAVENOUS
  Filled 2022-01-21 (×2): qty 50

## 2022-01-21 MED ORDER — SODIUM CHLORIDE 0.9 % IV SOLN: 10.0000 mL/h | Freq: Once | INTRAVENOUS | Status: DC

## 2022-01-21 MED ORDER — IOHEXOL 300 MG/ML  SOLN
100.0000 mL | Freq: Once | INTRAMUSCULAR | Status: AC | PRN
  Administered 2022-01-21: 75 mL via INTRAVENOUS

## 2022-01-21 MED ORDER — PANTOPRAZOLE SODIUM 40 MG IV SOLR: 40.0000 mg | Freq: Once | INTRAVENOUS | Status: DC

## 2022-01-21 MED ORDER — PIPERACILLIN-TAZOBACTAM 3.375 G IVPB 30 MIN
3.3750 g | Freq: Once | INTRAVENOUS | Status: AC
  Administered 2022-01-21: 3.375 g via INTRAVENOUS
  Filled 2022-01-21: qty 50

## 2022-01-21 NOTE — ED Notes (Signed)
Provider Jodi Mourning. aware of low hgb/hct results and is at bedside.

## 2022-01-21 NOTE — ED Notes (Signed)
Consent signed by pt for blood transfusion

## 2022-01-21 NOTE — ED Notes (Signed)
Report called to [facility] rn icu nurse.

## 2022-01-21 NOTE — H&P (Addendum)
History and Physical    [name] DOB: Dec 28, 1940 DOA: 01/21/2022  PCP: Rusty Aus, MD  Patient coming from: home   Chief Complaint: weakness, unable to walk  HPI: [name] is a 81 y.o. female with medical history significant of rheumatoid arthritis, COPD, GERD, hypothyroidism, ongoing tobacco abuse presents with complaints of weakness, abdominal pain and inability to walk due to weakness.  She reports this has been going on for the past 3 days.  Has noticed dark maroon stool for the past couple of days including today.  Experienced progressive generalized weakness today and brother called EMS.  EMS found patient to be hypotensive with systolic of 17P over 10C.  Patient complains of abdominal pain, some nausea but no vomiting.  She takes baby aspirin and Etodolac (for her RA). She reports was told by pcp to take ASA for stroke prevention.   ED Course: On presentation blood pressure 74/48, respiratory rate 19, pulse 82, on 100% room air, afebrile.  Hemoglobin 4.5, hematocrit 14.2, platelets 203, WBC 8.6, pertinent BMP labs to BUN 39, calcium 7.5, albumin 2.5, total protein 4.8, lactic acid 3.4 INR 1.4 blood cultures obtained x2  Chest x-ray reviewed personally Obstructive lung findings without acute superimposed cardiopulmonary process  CT head no acute abnormality  CT abdomen and pelvis 1. Possible mild thickening of the gastric antrum. No overt ulcers are identified by CT. 2. Stable mild biliary dilatation status post prior cholecystectomy.  GI was consulted by Dr. Joni Fears .2 units of PRBC was ordered by ER. Given 1L ivf, zosyn.    Review of Systems: All systems reviewed and otherwise negative.    Past Medical History:  Diagnosis Date   Abdominal pain    Anemia    Anxiety    Arthritis, rheumatoid (HCC)    Breast pain    Chest discomfort    Chest pain    Chronic pain syndrome    Collagen vascular disease (HCC)    hx rheumatoid  arthritis.    Constipation    COPD (chronic obstructive pulmonary disease) (HCC)    Diverticulosis    Fibrocystic breast disease    Gastritis    GERD (gastroesophageal reflux disease)    Hemorrhoids    Hx of colonic polyps    Hypothyroidism    Osteopenia    Renal artery stenosis (HCC)    Right ankle pain    Thyroid binding globulin deficiency    Tobacco abuse     Past Surgical History:  Procedure Laterality Date   CHOLECYSTECTOMY     JOINT REPLACEMENT     right hip   LEG SURGERY     TONSILLECTOMY       reports that she has been smoking cigarettes. She has been smoking an average of .1 packs per day. She has never used smokeless tobacco. She reports that she does not drink alcohol and does not use drugs.  Allergies  Allergen Reactions   Other Other (See Comments)    Uncoded Allergy. Allergen: symmetral Uncoded Allergy. Allergen: etrafon Uncoded Allergy. Allergen: desbutal Uncoded Allergy. Allergen: symmetral Uncoded Allergy. Allergen: etrafon Uncoded Allergy. Allergen: desbutal Other reaction(s): Unknown Uncoded Allergy. Allergen: symmetral Other reaction(s): Unknown Uncoded Allergy. Allergen: etrafon Uncoded Allergy. Allergen: symmetral Uncoded Allergy. Allergen: etrafon Uncoded Allergy. Allergen: desbutal    Sulfa Antibiotics     Other reaction(s): Unknown Uncoded Allergy. Allergen: desbutal   Denosumab Itching   Etrafon [Perphenazine-Amitriptyline]    Hydrocodone Nausea And Vomiting   Mirtazapine  Other reaction(s): Hallucination   Doxycycline Nausea Only   Percocet [Oxycodone-Acetaminophen] Nausea And Vomiting   Sertraline Palpitations    Family History  Problem Relation Age of Onset   Heart disease Sister    Hypertension Brother    Heart attack Brother    Hyperlipidemia Brother    Breast cancer Neg Hx      Prior to Admission medications   Medication Sig Start Date End Date Taking? Authorizing Provider  aspirin EC 81 MG EC tablet Take 1  tablet (81 mg total) by mouth daily. Swallow whole. 12/09/19   Hongalgi, Lenis Dickinson, MD  atorvastatin (LIPITOR) 10 MG tablet Take 10 mg by mouth daily. 07/22/21   [provider]  buPROPion (WELLBUTRIN XL) 150 MG 24 hr tablet Take 150 mg by mouth daily. 11/22/19   [provider]  dicyclomine (BENTYL) 10 MG capsule Take 10 mg by mouth 2 (two) times daily as needed. 09/30/21   [provider]  DULoxetine (CYMBALTA) 60 MG capsule Take 60 mg by mouth daily.    [provider]  etodolac (LODINE) 400 MG tablet Take 400 mg by mouth 2 (two) times daily. 09/22/21   [provider]  folic acid (FOLVITE) 1 MG tablet Take 1 tablet by mouth daily. 10/21/14   [provider]  galantamine (RAZADYNE) 8 MG tablet Take 8 mg by mouth daily.    [provider]  levothyroxine (SYNTHROID) 50 MCG tablet Take 50 mcg by mouth daily.     [provider]  lidocaine (LIDODERM) 5 % Place 1 patch onto the skin every 12 (twelve) hours. Remove & Discard patch within 12 hours or as directed by MD Patient not taking: Reported on 12/23/2021 10/12/21 10/12/22  Blake Divine, MD  methotrexate 250 MG/10ML injection ADMINISTER 0.8 ML ONCE A WEEK 02/27/20   [provider]  omeprazole (PRILOSEC) 40 MG capsule Take 40 mg by mouth in the morning and at bedtime.    [provider]  ondansetron (ZOFRAN-ODT) 4 MG disintegrating tablet Take 1 tablet (4 mg total) by mouth every 8 (eight) hours as needed for nausea or vomiting. 10/12/21   Blake Divine, MD  tiZANidine (ZANAFLEX) 2 MG tablet Take 2 mg by mouth 2 (two) times daily. 12/03/19   [provider]    Physical Exam: Vitals:   01/21/22 1455 01/21/22 1530 01/21/22 1640 01/21/22 1727  BP:  (!) 80/39 (!) 86/51 (!) 101/47  Pulse: 82 74 83 85  Resp: 19 (!) 30 (!) 22 20  Temp:    97.9 F (36.6 C)  TempSrc:    Oral  SpO2: 100% 100% 100% 100%  Weight:      Height:        Constitutional: NAD, calm,  comfortable Vitals:   01/21/22 1455 01/21/22 1530 01/21/22 1640 01/21/22 1727  BP:  (!) 80/39 (!) 86/51 (!) 101/47  Pulse: 82 74 83 85  Resp: 19 (!) 30 (!) 22 20  Temp:    97.9 F (36.6 C)  TempSrc:    Oral  SpO2: 100% 100% 100% 100%  Weight:      Height:       Gen: calm, NAD, pale Eyes: PERRL, lids and conjunctivae normal ENMT: Mucous membranes are moist. Posterior pharynx clear of any exudate or lesions.Normal dentition.  Neck: normal, supple, no masses Respiratory: clear to auscultation bilaterally, no wheezing, no crackles. Normal respiratory effort. No accessory muscle use.  Cardiovascular: Regular rate and rhythm, no murmurs / rubs / gallops. No  extremity edema.No carotid bruits.  Abdomen: no tenderness, no masses palpated. No hepatosplenomegaly. Bowel sounds positive.  Musculoskeletal: no clubbing / cyanosis. Joint deformity consistent with RA. No LE edema.Good ROMNormal muscle tone.  Skin: no rashes Neurologic: CN 2-12 grossly intact. Sensation intact,Strength 5/5 in all 4.  Psychiatric: Normal judgment and insight. Alert and oriented x 3. Normal mood.    Labs on Admission: I have personally reviewed following labs and imaging studies  CBC: Recent Labs  Lab 01/21/22 1455  WBC 8.6  NEUTROABS 6.6  HGB 4.5*  HCT 14.2*  MCV 99.3  PLT 496   Basic Metabolic Panel: Recent Labs  Lab 01/21/22 1455  NA 135  K 3.6  CL 106  CO2 24  GLUCOSE 93  BUN 39*  CREATININE 0.80  CALCIUM 7.5*   GFR: Estimated Creatinine Clearance: 46.2 mL/min (by C-G formula based on SCr of 0.8 mg/dL). Liver Function Tests: Recent Labs  Lab 01/21/22 1455  AST 19  ALT 13  ALKPHOS 41  BILITOT 0.5  PROT 4.8*  ALBUMIN 2.5*   No results for input(s): "LIPASE", "AMYLASE" in the last 168 hours. No results for input(s): "AMMONIA" in the last 168 hours. Coagulation Profile: Recent Labs  Lab 01/21/22 1455  INR 1.4*   Cardiac Enzymes: No results for input(s): "CKTOTAL", "CKMB",  "CKMBINDEX", "TROPONINI" in the last 168 hours. BNP (last 3 results) No results for input(s): "PROBNP" in the last 8760 hours. HbA1C: No results for input(s): "HGBA1C" in the last 72 hours. CBG: No results for input(s): "GLUCAP" in the last 168 hours. Lipid Profile: No results for input(s): "CHOL", "HDL", "LDLCALC", "TRIG", "CHOLHDL", "LDLDIRECT" in the last 72 hours. Thyroid Function Tests: No results for input(s): "TSH", "T4TOTAL", "FREET4", "T3FREE", "THYROIDAB" in the last 72 hours. Anemia Panel: No results for input(s): "VITAMINB12", "FOLATE", "FERRITIN", "TIBC", "IRON", "RETICCTPCT" in the last 72 hours. Urine analysis:    Component Value Date/Time   COLORURINE YELLOW 10/11/2021 2250   APPEARANCEUR CLEAR 10/11/2021 2250   LABSPEC 1.020 10/11/2021 2250   PHURINE 7.0 10/11/2021 2250   GLUCOSEU 100 (A) 10/11/2021 2250   HGBUR NEGATIVE 10/11/2021 2250   BILIRUBINUR NEGATIVE 10/11/2021 2250   KETONESUR NEGATIVE 10/11/2021 2250   PROTEINUR TRACE (A) 10/11/2021 2250   UROBILINOGEN 0.2 03/06/2007 2115   NITRITE NEGATIVE 10/11/2021 2250   LEUKOCYTESUR NEGATIVE 10/11/2021 2250    Radiological Exams on Admission: CT Head Wo Contrast  Result Date: 01/21/2022 CLINICAL DATA:  Mental status change, unknown cause EXAM: CT HEAD WITHOUT CONTRAST TECHNIQUE: Contiguous axial images were obtained from the base of the skull through the vertex without intravenous contrast. RADIATION DOSE REDUCTION: This exam was performed according to the departmental dose-optimization program which includes automated exposure control, adjustment of the mA and/or kV according to patient size and/or use of iterative reconstruction technique. COMPARISON:  CT head May 7, 23. FINDINGS: Brain: No evidence of acute large vascular territory infarction, hemorrhage, hydrocephalus, extra-axial collection or mass lesion/mass effect. Patchy white matter hypodensities, nonspecific but compatible with chronic microvascular  ischemic disease. Vascular: No hyperdense vessel identified. Skull: No acute fracture. Sinuses/Orbits: Largely clear sinuses.  No acute orbital findings. Other: No mastoid effusions. IMPRESSION: No evidence of acute intracranial abnormality. Electronically Signed   By: Margaretha Sheffield M.D.   On: 01/21/2022 16:14   CT Abdomen Pelvis W Contrast  Result Date: 01/21/2022 CLINICAL DATA:  Abdominal pain, hypotension, melena, guaiac-positive stool and severe anemia. EXAM: CT ABDOMEN AND PELVIS WITH CONTRAST TECHNIQUE: Multidetector CT imaging of the abdomen  and pelvis was performed using the standard protocol following bolus administration of intravenous contrast. RADIATION DOSE REDUCTION: This exam was performed according to the departmental dose-optimization program which includes automated exposure control, adjustment of the mA and/or kV according to patient size and/or use of iterative reconstruction technique. CONTRAST:  56m OMNIPAQUE IOHEXOL 300 MG/ML  SOLN COMPARISON:  Prior CT of the abdomen and pelvis on 10/11/2021 FINDINGS: Lower chest: No acute abnormality. Hepatobiliary: Stable mild biliary dilatation status post prior cholecystectomy. No evidence by CT to suggest choledocholithiasis. The liver is unremarkable. Pancreas: Unremarkable. No pancreatic ductal dilatation or surrounding inflammatory changes. Spleen: Normal in size without focal abnormality. Adrenals/Urinary Tract: Adrenal glands are unremarkable. Kidneys are normal, without renal calculi, focal lesion, or hydronephrosis. Bladder is unremarkable. Stomach/Bowel: The gastric antrum may be mildly thickened. However, the stomach is nondistended and difficult to evaluate. Bowel shows no evidence of obstruction, ileus, inflammation or lesion. No visible ulcers. The appendix is not visualized. Diverticulosis of the sigmoid colon without evidence of acute diverticulitis. No free intraperitoneal air. Vascular/Lymphatic: Atherosclerosis of the abdominal  aorta and iliac arteries without aneurysms. Reproductive: Uterus and bilateral adnexa are unremarkable. Other: No abdominal wall hernia or abnormality. No abdominopelvic ascites. Musculoskeletal: No acute or significant osseous findings. IMPRESSION: 1. Possible mild thickening of the gastric antrum. No overt ulcers are identified by CT. 2. Stable mild biliary dilatation status post prior cholecystectomy. Electronically Signed   By: GAletta EdouardM.D.   On: 01/21/2022 16:04   DG Chest Port 1 View  Result Date: 01/21/2022 CLINICAL DATA:  Sepsis. EXAM: PORTABLE CHEST 1 VIEW COMPARISON:  Chest XR, 10/11/2021.  CT chest, 06/09/2021. FINDINGS: Cardiomediastinal silhouette is within normal limits. Lungs are hyperinflated with flattening of the diaphragm. No focal consolidation or mass. No pleural effusion or pneumothorax. Cholecystectomy clips. No acute displaced fracture. IMPRESSION: Obstructive lung findings without acute superimposed cardiopulmonary process. Electronically Signed   By: JMichaelle BirksM.D.   On: 01/21/2022 15:26    EKG: Independently reviewed.  Ectopic atrial rhythm, prolonged QT, no ischemic changes  Assessment/Plan Principal Problem:   GIB (gastrointestinal bleeding) Active Problems:   RA (rheumatoid arthritis) (HCC)   Hyperlipidemia   GERD (gastroesophageal reflux disease)   Tobacco abuse   Hypothyroid   COPD (chronic obstructive pulmonary disease) (HCC)   Hypotension    Acute GIB Patient found with hypotension and dark-colored stool On NSAIDS and ASA, likely culprit Hemoglobin 4.5, was 10.6 on 12/23/2021 . Bun elevated. Cta/p as above Given zosyn and IVF Will continue zosyn for now GI consulted by ED Npo for now Continue IV fluids for resuscitation Will admit inpatient to stepdown unit Telemetry 2 units of packed red blood cells have been ordered by ED, may need more.  We will check H&H after second unit IV PPI twice daily Per ER they spoke to GI Dr. WAllen Norrisand he  recommended surgery to get involved.  Also recommended FFP, which is ordered by ED Transfuse to keep Hg around 8  2.  Hypotension Due to hypovolemia from GI bleed Continue IV fluids Keep map above 65 SDU  3. GERD Started on IV ppi for above  4. Hyperlipidemia Hold statin (home med)since npo  5. Hypothryoid On synthroid. Continue   6.COPD On RA. No acute exacerbation  7. Tobacco abuse Patient continues to smoke.  Counseled patient extensively on smoking cessation Nicotine patch  8.RA On methotrexate weekly.    DVT prophylaxis: scd  Code Status: DNR, confirmed with pt, brother also agreed ,  present at bedside  Family Communication: brother at bedside  Disposition Plan: home   Consults called: GI  Admission status: inpatient as patient requires more than 2 midnight stay dates of hospitalization due to hemodynamic instability, severity of her illness and IV treatment   Nolberto Hanlon MD [facility]   If 7PM-7AM, please contact night-coverage [email or web address] Password [facility]  01/21/2022, 5:35 PM

## 2022-01-21 NOTE — ED Notes (Addendum)
1st unit of fresh frozen plasma infusing.  Pt alert.

## 2022-01-21 NOTE — ED Notes (Signed)
Ffp infused. Pt alert  pt moved to icu .

## 2022-01-21 NOTE — ED Notes (Signed)
This rn spoke with megan in icu. Bed not ready in ICU

## 2022-01-21 NOTE — ED Notes (Signed)
1st unit of prbc's infusing.

## 2022-01-21 NOTE — Consult Note (Signed)
PHARMACY -  BRIEF ANTIBIOTIC NOTE   Pharmacy has received consult(s) for Vancomycin from an ED provider.  The patient's profile has been reviewed for ht/wt/allergies/indication/available labs.    One time order(s) placed for Vancomycin 1g IV x 1 dose.  Further antibiotics/pharmacy consults should be ordered by admitting physician if indicated.                       Thank you, Pearla Dubonnet 01/21/2022  3:31 PM

## 2022-01-21 NOTE — ED Provider Notes (Signed)
[facility] Provider Note    Event Date/Time   First MD Initiated Contact with Patient 01/21/22 1447     (approximate)   History   Abdominal Pain and Weakness   HPI  [name] is a 81 y.o. female   Past medical history of COPD, hemorrhoids, GERD, anemia, chronic pain who presents with generalized weakness and hypotension found by EMS.  Reportedly 70s over 62s.  Patient reports that she has been living home by herself and has had upper abdominal pain and discomfort for the last 3 days and progressive generalized weakness.  She denies any falls and denies pain elsewhere, specifically denying chest pain, shortness of breath, fever.  She does state that she has had some darker stools in the past week.  No dysuria.  Has chronic neck pain, unchanged from prior.  Alcohol or NSAID use.  No history of GI bleeding.   History was obtained via the patient and her brother who was at bedside, also reported by EMS      Physical Exam   Triage Vital Signs: ED Triage Vitals  Enc Vitals Group     BP 01/21/22 1440 99/85     Pulse Rate 01/21/22 1441 81     Resp 01/21/22 1443 19     Temp --      Temp src --      SpO2 01/21/22 1441 100 %     Weight 01/21/22 1440 115 lb (52.2 kg)     Height 01/21/22 1440 '5\' 4"'$  (1.626 m)     Head Circumference --      Peak Flow --      Pain Score 01/21/22 1438 7     Pain Loc --      Pain Edu? --      Excl. in [place]? --     Most recent vital signs: Vitals:   01/21/22 1445 01/21/22 1455  BP: (!) 74/48   Pulse: 80 82  Resp: (!) 25 19  SpO2: 100% 100%    General: Awake, no distress.  Conversant and alert and oriented.  She does appear pale with pale conjunctiva and poor skin turgor, slowed cap refill. CV:  Normal rate 82, normal rhythm without obvious murmurs. Resp:  Normal effort.  Clear to auscultation bilaterally without focality or wheezing Abd:  No distention.  Soft no rigidity or guarding but mild epigastric  tenderness to palpation Other:  Moving all extremities.  Station intact neck is supple with full range of motion, no meningismus.  No signs of head trauma.   ED Results / Procedures / Treatments   Labs (all labs ordered are listed, but only abnormal results are displayed) Labs Reviewed  COMPREHENSIVE METABOLIC PANEL - Abnormal; Notable for the following components:      Result Value   BUN 39 (*)    Calcium 7.5 (*)    Total Protein 4.8 (*)    Albumin 2.5 (*)    All other components within normal limits  CBC WITH DIFFERENTIAL/PLATELET - Abnormal; Notable for the following components:   RBC 1.43 (*)    Hemoglobin 4.5 (*)    HCT 14.2 (*)    RDW 16.9 (*)    All other components within normal limits  PROTIME-INR - Abnormal; Notable for the following components:   Prothrombin Time 17.1 (*)    INR 1.4 (*)    All other components within normal limits  CULTURE, BLOOD (ROUTINE X 2)  CULTURE, BLOOD (ROUTINE X 2)  URINE CULTURE  APTT  LACTIC ACID, PLASMA  LACTIC ACID, PLASMA  URINALYSIS, COMPLETE (UACMP) WITH MICROSCOPIC  PREPARE RBC (CROSSMATCH)     I reviewed labs and they are notable for anemia 4.5 from 10.6, 71-monthago.  EKG  ED ECG REPORT I, SLucillie Garfinkel the attending physician, personally viewed and interpreted this ECG.   Date: 01/21/2022  EKG Time: 1442  Rate: 82  Rhythm: normal EKG, normal sinus rhythm, unchanged from previous tracings  Axis: Normal  Intervals: No conduction defects  ST&T Change: No STEMI    RADIOLOGY I dependently reviewed and interpreted chest x-ray and see no obvious focal consolidation or pneumothorax   PROCEDURES:  Critical Care performed: Yes, see critical care procedure note(s)  .Critical Care  Performed by: WLucillie Garfinkel MD Authorized by: WLucillie Garfinkel MD   Critical care provider statement:    Critical care time (minutes):  30   Critical care time was exclusive of:  Separately billable procedures and treating other patients    Critical care was necessary to treat or prevent imminent or life-threatening deterioration of the following conditions:  Sepsis and shock   Critical care was time spent personally by me on the following activities:  Ordering and performing treatments and interventions, ordering and review of laboratory studies, ordering and review of radiographic studies, re-evaluation of patient's condition, evaluation of patient's response to treatment and examination of patient    MEDICATIONS ORDERED IN ED: Medications  piperacillin-tazobactam (ZOSYN) IVPB 3.375 g (has no administration in time range)  pantoprazole (PROTONIX) injection 40 mg (has no administration in time range)  vancomycin (VANCOCIN) IVPB 1000 mg/200 mL premix (has no administration in time range)  0.9 %  sodium chloride infusion (has no administration in time range)  lactated ringers bolus 1,000 mL (1,000 mLs Intravenous New Bag/Given 01/21/22 1454)  iohexol (OMNIPAQUE) 300 MG/ML solution 100 mL (75 mLs Intravenous Contrast Given 01/21/22 1536)     IMPRESSION / MDM / [place]/ ED COURSE  I reviewed the triage vital signs and the nursing notes.                              Differential diagnosis includes, but is not limited to, GI bleeding, infection/sepsis, dehydration.   The patient is on the cardiac monitor to evaluate for evidence of arrhythmia and/or significant heart rate changes.  MDM: This is an 81year old patient with progressive generalized weakness and hypotension with differential diagnosis as above including but not limited to infection/sepsis, dehydration, GI bleeding.  She does have melena on my exam.  Upper abdominal tenderness to palpation, and ultimately she was found to have anemia to 4.5 which is significantly lower than her testing 1 month ago.  Suspect upper GI bleeding, slow given chronicity of symptoms and progression of weakness over the course of 1 week no brisk bleeding from below on my exam, sent  in for transfusion of packed red blood cells in the emergency department.  She was given broad-spectrum antibiotics as initial presentation suspected sepsis as differential diagnosis.  Will be admitted.  She is pending imaging of her abdomen.    Patient's presentation is most consistent with acute presentation with potential threat to life or bodily function.       FINAL CLINICAL IMPRESSION(S) / ED DIAGNOSES   Final diagnoses:  Upper GI bleed  Anemia due to blood loss, acute  [place]ock [place]     Rx / DC Orders  ED Discharge Orders     None        Note:  This document was prepared using Dragon voice recognition software and may include unintentional dictation errors.    Lucillie Garfinkel, MD 01/21/22 1537

## 2022-01-21 NOTE — ED Triage Notes (Addendum)
Pt in from home via EMS; recently had shot in neck recently for arthritis; is experiencing abd pain x3 days; 154 BG; 500cc NS bolus from EMS; 20 L fa from EMS; 80/40 BP with EMS after bolus; 98 HR sinus. Pt also c/o black stools; pt unsure how long they have been black. Pt denies seeing bright red blood.

## 2022-01-22 ENCOUNTER — Encounter: Payer: Self-pay | Admitting: Internal Medicine

## 2022-01-22 ENCOUNTER — Encounter
Admission: EM | Disposition: A | Discharge status: Skilled Nursing Facility | Payer: Self-pay | Source: Home / Self Care | Attending: Internal Medicine

## 2022-01-22 ENCOUNTER — Inpatient Hospital Stay: Payer: Medicare Other | Admitting: Anesthesiology

## 2022-01-22 ENCOUNTER — Encounter
Admission: EM | Disposition: A | Discharge status: Skilled Nursing Facility | Payer: Medicare Other | Source: Home / Self Care | Attending: Internal Medicine

## 2022-01-22 DIAGNOSIS — K219 Gastro-esophageal reflux disease without esophagitis: Secondary | ICD-10-CM | POA: Diagnosis not present

## 2022-01-22 DIAGNOSIS — D62 Acute posthemorrhagic anemia: Secondary | ICD-10-CM

## 2022-01-22 DIAGNOSIS — D500 Iron deficiency anemia secondary to blood loss (chronic): Secondary | ICD-10-CM

## 2022-01-22 DIAGNOSIS — I700 Atherosclerosis of aorta: Secondary | ICD-10-CM

## 2022-01-22 DIAGNOSIS — I771 Stricture of artery: Secondary | ICD-10-CM

## 2022-01-22 DIAGNOSIS — K921 Melena: Secondary | ICD-10-CM | POA: Diagnosis not present

## 2022-01-22 DIAGNOSIS — K922 Gastrointestinal hemorrhage, unspecified: Secondary | ICD-10-CM

## 2022-01-22 DIAGNOSIS — R571 Hypovolemic shock: Secondary | ICD-10-CM

## 2022-01-22 DIAGNOSIS — J449 Chronic obstructive pulmonary disease, unspecified: Secondary | ICD-10-CM | POA: Diagnosis not present

## 2022-01-22 DIAGNOSIS — Z87891 Personal history of nicotine dependence: Secondary | ICD-10-CM

## 2022-01-22 HISTORY — PX: EMBOLIZATION: CATH118239

## 2022-01-22 HISTORY — PX: ESOPHAGOGASTRODUODENOSCOPY (EGD) WITH PROPOFOL: SHX5813

## 2022-01-22 LAB — TYPE AND SCREEN
ABO/RH(D): O POS
Antibody Screen: NEGATIVE
Unit division: 0
Unit division: 0

## 2022-01-22 LAB — HEMOGLOBIN AND HEMATOCRIT, BLOOD
HCT: 22.4 % — ABNORMAL LOW (ref 36.0–46.0)
Hemoglobin: 7.4 g/dL — ABNORMAL LOW (ref 12.0–15.0)

## 2022-01-22 LAB — CBC
HCT: 23.4 % — ABNORMAL LOW (ref 36.0–46.0)
Hemoglobin: 7.8 g/dL — ABNORMAL LOW (ref 12.0–15.0)
MCH: 30.2 pg (ref 26.0–34.0)
MCHC: 33.3 g/dL (ref 30.0–36.0)
MCV: 90.7 fL (ref 80.0–100.0)
Platelets: 208 10*3/uL (ref 150–400)
RBC: 2.58 MIL/uL — ABNORMAL LOW (ref 3.87–5.11)
RDW: 16.2 % — ABNORMAL HIGH (ref 11.5–15.5)
WBC: 9.4 10*3/uL (ref 4.0–10.5)
nRBC: 0 % (ref 0.0–0.2)

## 2022-01-22 LAB — URINALYSIS, COMPLETE (UACMP) WITH MICROSCOPIC
Bacteria, UA: NONE SEEN
Bilirubin Urine: NEGATIVE
Glucose, UA: NEGATIVE mg/dL
Hgb urine dipstick: NEGATIVE
Ketones, ur: NEGATIVE mg/dL
Leukocytes,Ua: NEGATIVE
Nitrite: NEGATIVE
Protein, ur: NEGATIVE mg/dL
Specific Gravity, Urine: 1.024 (ref 1.005–1.030)
Squamous Epithelial / HPF: NONE SEEN (ref 0–5)
WBC, UA: NONE SEEN WBC/hpf (ref 0–5)
pH: 7 (ref 5.0–8.0)

## 2022-01-22 LAB — BPAM RBC
Blood Product Expiration Date: 202309212359
Blood Product Expiration Date: 202309212359
ISSUE DATE / TIME: 202308171711
ISSUE DATE / TIME: 202308172131
Unit Type and Rh: 5100
Unit Type and Rh: 5100

## 2022-01-22 SURGERY — ESOPHAGOGASTRODUODENOSCOPY (EGD) WITH PROPOFOL
Anesthesia: General

## 2022-01-22 SURGERY — EMBOLIZATION
Anesthesia: Moderate Sedation

## 2022-01-22 MED ORDER — FENTANYL CITRATE (PF) 100 MCG/2ML IJ SOLN
INTRAMUSCULAR | Status: DC | PRN
  Administered 2022-01-22: 25 ug via INTRAVENOUS

## 2022-01-22 MED ORDER — SODIUM CHLORIDE 0.9 % IV SOLN: 250.0000 mL | INTRAVENOUS | Status: DC | PRN

## 2022-01-22 MED ORDER — LIDOCAINE HCL (CARDIAC) PF 100 MG/5ML IV SOSY
PREFILLED_SYRINGE | INTRAVENOUS | Status: DC | PRN
  Administered 2022-01-22: 40 mg via INTRAVENOUS

## 2022-01-22 MED ORDER — GLUCAGON HCL (RDNA) 1 MG IJ SOLR
INTRAMUSCULAR | Status: DC | PRN
  Administered 2022-01-22: .5 mg via INTRAVENOUS

## 2022-01-22 MED ORDER — SODIUM CHLORIDE 0.9 % IV SOLN: INTRAVENOUS | Status: DC

## 2022-01-22 MED ORDER — ONDANSETRON HCL 4 MG/2ML IJ SOLN
INTRAMUSCULAR | Status: AC
  Filled 2022-01-22: qty 2

## 2022-01-22 MED ORDER — GLUCAGON HCL RDNA (DIAGNOSTIC) 1 MG IJ SOLR
INTRAMUSCULAR | Status: AC
  Filled 2022-01-22: qty 1

## 2022-01-22 MED ORDER — FENTANYL CITRATE (PF) 100 MCG/2ML IJ SOLN
INTRAMUSCULAR | Status: AC
  Filled 2022-01-22: qty 2

## 2022-01-22 MED ORDER — FAMOTIDINE 20 MG PO TABS: 40.0000 mg | ORAL_TABLET | Freq: Once | ORAL | Status: DC | PRN

## 2022-01-22 MED ORDER — DIPHENHYDRAMINE HCL 50 MG/ML IJ SOLN: 50.0000 mg | Freq: Once | INTRAMUSCULAR | Status: DC | PRN

## 2022-01-22 MED ORDER — CEFAZOLIN SODIUM-DEXTROSE 2-4 GM/100ML-% IV SOLN: 2.0000 g | INTRAVENOUS | Status: AC

## 2022-01-22 MED ORDER — MORPHINE SULFATE (PF) 4 MG/ML IV SOLN
2.0000 mg | INTRAVENOUS | Status: DC | PRN
  Administered 2022-01-23 (×2): 2 mg via INTRAVENOUS
  Filled 2022-01-22 (×2): qty 1

## 2022-01-22 MED ORDER — LIDOCAINE 5 % EX PTCH
1.0000 | MEDICATED_PATCH | CUTANEOUS | Status: DC
  Administered 2022-01-23 – 2022-01-26 (×4): 1 via TRANSDERMAL
  Filled 2022-01-22 (×5): qty 1

## 2022-01-22 MED ORDER — HYDROMORPHONE HCL 1 MG/ML IJ SOLN: 1.0000 mg | Freq: Once | INTRAMUSCULAR | Status: DC | PRN

## 2022-01-22 MED ORDER — SODIUM CHLORIDE 0.9% FLUSH: 3.0000 mL | INTRAVENOUS | Status: DC | PRN

## 2022-01-22 MED ORDER — CEFAZOLIN SODIUM-DEXTROSE 2-4 GM/100ML-% IV SOLN
INTRAVENOUS | Status: AC
  Administered 2022-01-22: 2 g via INTRAVENOUS
  Filled 2022-01-22: qty 100

## 2022-01-22 MED ORDER — SODIUM CHLORIDE 0.9% FLUSH
3.0000 mL | Freq: Two times a day (BID) | INTRAVENOUS | Status: DC
  Administered 2022-01-22 – 2022-01-27 (×10): 3 mL via INTRAVENOUS

## 2022-01-22 MED ORDER — PROPOFOL 500 MG/50ML IV EMUL
INTRAVENOUS | Status: DC | PRN
  Administered 2022-01-22: 100 ug/kg/min via INTRAVENOUS

## 2022-01-22 MED ORDER — METHYLPREDNISOLONE SODIUM SUCC 125 MG IJ SOLR
125.0000 mg | Freq: Once | INTRAMUSCULAR | Status: DC | PRN
Start: 2022-01-22 — End: 2022-01-22

## 2022-01-22 MED ORDER — MIDAZOLAM HCL 2 MG/ML PO SYRP
8.0000 mg | ORAL_SOLUTION | Freq: Once | ORAL | Status: DC | PRN
Start: 2022-01-22 — End: 2022-01-22

## 2022-01-22 MED ORDER — PROPOFOL 10 MG/ML IV BOLUS
INTRAVENOUS | Status: DC | PRN
  Administered 2022-01-22: 20 mg via INTRAVENOUS
  Administered 2022-01-22: 10 mg via INTRAVENOUS
  Administered 2022-01-22: 30 mg via INTRAVENOUS
  Administered 2022-01-22: 40 mg via INTRAVENOUS
  Administered 2022-01-22: 50 mg via INTRAVENOUS

## 2022-01-22 MED ORDER — CHLORHEXIDINE GLUCONATE CLOTH 2 % EX PADS
6.0000 | MEDICATED_PAD | Freq: Every day | CUTANEOUS | Status: DC
  Administered 2022-01-22 – 2022-01-27 (×6): 6 via TOPICAL

## 2022-01-22 MED ORDER — ONDANSETRON HCL 4 MG/2ML IJ SOLN
4.0000 mg | Freq: Four times a day (QID) | INTRAMUSCULAR | Status: DC | PRN
  Administered 2022-01-22: 4 mg via INTRAVENOUS

## 2022-01-22 MED ORDER — MIDAZOLAM HCL 2 MG/2ML IJ SOLN
INTRAMUSCULAR | Status: AC
  Filled 2022-01-22: qty 2

## 2022-01-22 MED ORDER — PROPOFOL 10 MG/ML IV BOLUS
INTRAVENOUS | Status: AC
  Filled 2022-01-22: qty 20

## 2022-01-22 MED ORDER — MIDAZOLAM HCL 2 MG/2ML IJ SOLN
INTRAMUSCULAR | Status: DC | PRN
  Administered 2022-01-22: .5 mg via INTRAVENOUS

## 2022-01-22 MED ORDER — GLUCAGON HCL RDNA (DIAGNOSTIC) 1 MG IJ SOLR
INTRAMUSCULAR | Status: DC | PRN
  Administered 2022-01-22: .5 mg via INTRAVENOUS

## 2022-01-22 MED ORDER — SODIUM CHLORIDE 0.9 % IV SOLN: INTRAVENOUS | Status: AC

## 2022-01-22 SURGICAL SUPPLY — 21 items
CATH ANGIO 5F PIGTAIL 65CM (CATHETERS) IMPLANT
CATH KUMPE SOFT-VU 5FR 65 (CATHETERS) IMPLANT
CATH MICROCATH PRGRT 2.8F 110 (CATHETERS) IMPLANT
CATH VS15FR (CATHETERS) IMPLANT
DEVICE STARCLOSE SE CLOSURE (Vascular Products) IMPLANT
DEVICE TORQUE .025-.038 (MISCELLANEOUS) IMPLANT
GLIDEWIRE STIFF .35X180X3 HYDR (WIRE) IMPLANT
MICROCATH PROGREAT 2.8F 110 CM (CATHETERS) ×1
NDL ENTRY 21GA 7CM ECHOTIP (NEEDLE) IMPLANT
NEEDLE ENTRY 21GA 7CM ECHOTIP (NEEDLE) ×1 IMPLANT
PACK ANGIOGRAPHY (CUSTOM PROCEDURE TRAY) ×1 IMPLANT
SET INTRO CAPELLA COAXIAL (SET/KITS/TRAYS/PACK) IMPLANT
SHEATH ANL 5FRX45 (SHEATH) IMPLANT
SHEATH BRITE TIP 5FRX11 (SHEATH) IMPLANT
SHEATH PROBE COVER 6X72 (BAG) IMPLANT
SUT MNCRL AB 4-0 PS2 18 (SUTURE) IMPLANT
SYR EMBOSPHERE 500-700 2ML (Embolic) ×1 IMPLANT
SYR MEDRAD MARK 7 150ML (SYRINGE) IMPLANT
SYRINGE EMBOSPHERE 500-700 2ML (Embolic) IMPLANT
TUBING CONTRAST HIGH PRESS 72 (TUBING) IMPLANT
WIRE GUIDERIGHT .035X150 (WIRE) IMPLANT

## 2022-01-22 NOTE — Interval H&P Note (Signed)
History and Physical Interval Note:  01/22/2022 6:21 PM  [name]  has presented today for surgery, with the diagnosis of GI bleed.  The various methods of treatment have been discussed with the patient and family. After consideration of risks, benefits and other options for treatment, the patient has consented to  Procedure(s): EMBOLIZATION (N/A) as a surgical intervention.  The patient's history has been reviewed, patient examined, no change in status, stable for surgery.  I have reviewed the patient's chart and labs.  Questions were answered to the patient's satisfaction.     [name]

## 2022-01-22 NOTE — Anesthesia Postprocedure Evaluation (Signed)
Anesthesia Post Note  Patient: [name]  Procedure(s) Performed: ESOPHAGOGASTRODUODENOSCOPY (EGD) WITH PROPOFOL  Patient location during evaluation: Endoscopy Anesthesia Type: General Level of consciousness: awake and alert Pain management: pain level controlled Vital Signs Assessment: post-procedure vital signs reviewed and stable Respiratory status: spontaneous breathing, nonlabored ventilation, respiratory function stable and patient connected to nasal cannula oxygen Cardiovascular status: blood pressure returned to baseline and stable Postop Assessment: no apparent nausea or vomiting Anesthetic complications: no   No notable events documented.   Last Vitals:  Vitals:   01/22/22 1406 01/22/22 1433  BP: (!) 120/56 122/87  Pulse: 79   Resp: (!) 24   Temp:    SpO2: 96%     Last Pain:  Vitals:   01/22/22 1433  TempSrc:   PainSc: 0-No pain                 Precious Haws Khadejah Son

## 2022-01-22 NOTE — Progress Notes (Signed)
PROGRESS NOTE    [name]  KWI:[id number] DOB: 12-10-1940 DOA: 01/21/2022 PCP: Rusty Aus, MD    Brief Narrative:  [name] is a 81 y.o. female with medical history significant of rheumatoid arthritis, COPD, GERD, hypothyroidism, ongoing tobacco abuse presents with complaints of weakness, found hypotensive, and Hg 4.5.s/p 2 units prbc. GI consulted.    Consultants:  Gi  Procedures:   Antimicrobials:      Subjective: Feels better. No sob , cp, abdpain/v/v  Objective: Vitals:   01/22/22 1255 01/22/22 1403 01/22/22 1406 01/22/22 1433  BP: (!) 112/59 (!) 120/56 (!) 120/56 122/87  Pulse: 99 83 79   Resp: 19  (!) 24   Temp: 97.6 F (36.4 C) 98.3 F (36.8 C)    TempSrc: Temporal Temporal    SpO2: 97% 100% 96%   Weight:      Height:        Intake/Output Summary (Last 24 hours) at 01/22/2022 1531 Last data filed at 01/22/2022 1355 Gross per 24 hour  Intake 2985.7 ml  Output 600 ml  Net 2385.7 ml   Filed Weights   01/21/22 1440 01/21/22 1900  Weight: 52.2 kg 41.6 kg    Examination:  Calm, NAD Cta no w/r Reg s1/s2 no gallop Soft benign +bs No edema Grossly intact Mood and affect appropriate in current setting     Data Reviewed: I have personally reviewed following labs and imaging studies  CBC: Recent Labs  Lab 01/21/22 1455 01/22/22 0201  WBC 8.6 9.4  NEUTROABS 6.6  --   HGB 4.5* 7.8*  HCT 14.2* 23.4*  MCV 99.3 90.7  PLT 203 242   Basic Metabolic Panel: Recent Labs  Lab 01/21/22 1455  NA 135  K 3.6  CL 106  CO2 24  GLUCOSE 93  BUN 39*  CREATININE 0.80  CALCIUM 7.5*   GFR: Estimated Creatinine Clearance: 36.8 mL/min (by C-G formula based on SCr of 0.8 mg/dL). Liver Function Tests: Recent Labs  Lab 01/21/22 1455  AST 19  ALT 13  ALKPHOS 41  BILITOT 0.5  PROT 4.8*  ALBUMIN 2.5*   No results for input(s): "LIPASE", "AMYLASE" in the last 168 hours. No results for input(s): "AMMONIA" in the last 168  hours. Coagulation Profile: Recent Labs  Lab 01/21/22 1455  INR 1.4*   Cardiac Enzymes: No results for input(s): "CKTOTAL", "CKMB", "CKMBINDEX", "TROPONINI" in the last 168 hours. BNP (last 3 results) No results for input(s): "PROBNP" in the last 8760 hours. HbA1C: No results for input(s): "HGBA1C" in the last 72 hours. CBG: Recent Labs  Lab 01/21/22 1900  GLUCAP 82   Lipid Profile: No results for input(s): "CHOL", "HDL", "LDLCALC", "TRIG", "CHOLHDL", "LDLDIRECT" in the last 72 hours. Thyroid Function Tests: No results for input(s): "TSH", "T4TOTAL", "FREET4", "T3FREE", "THYROIDAB" in the last 72 hours. Anemia Panel: No results for input(s): "VITAMINB12", "FOLATE", "FERRITIN", "TIBC", "IRON", "RETICCTPCT" in the last 72 hours. Sepsis Labs: Recent Labs  Lab 01/21/22 1517 01/21/22 1735  LATICACIDVEN 3.4* 1.1    Recent Results (from the past 240 hour(s))  Blood Culture (routine x 2)     Status: None (Preliminary result)   Collection Time: 01/21/22  3:17 PM   Specimen: BLOOD  Result Value Ref Range Status   Specimen Description BLOOD BLOOD LEFT FOREARM  Final   Special Requests   Final    BOTTLES DRAWN AEROBIC AND ANAEROBIC Blood Culture adequate volume   Culture   Final    NO GROWTH <  24 HOURS Performed at [facility], [address] [place], [place] [address]    Report Status PENDING  Incomplete  Blood Culture (routine x 2)     Status: None (Preliminary result)   Collection Time: 01/21/22  3:17 PM   Specimen: BLOOD  Result Value Ref Range Status   Specimen Description BLOOD BLOOD LEFT HAND  Final   Special Requests   Final    BOTTLES DRAWN AEROBIC AND ANAEROBIC Blood Culture results may not be optimal due to an excessive volume of blood received in culture bottles   Culture   Final    NO GROWTH < 24 HOURS Performed at [facility], [address] [place], [place] [address]    Report Status PENDING  Incomplete  MRSA Next Gen by PCR, Nasal      Status: None   Collection Time: 01/21/22  8:26 PM   Specimen: Nasal Mucosa; Nasal Swab  Result Value Ref Range Status   MRSA by PCR Next Gen NOT DETECTED NOT DETECTED Final    Comment: (NOTE) The GeneXpert MRSA Assay (FDA approved for NASAL specimens only), is one component of a comprehensive MRSA colonization surveillance program. It is not intended to diagnose MRSA infection nor to guide or monitor treatment for MRSA infections. Test performance is not FDA approved in patients less than 73 years old. Performed at [facility], [address] [place], [place] [address]          Radiology Studies: CT Head Wo Contrast  Result Date: 01/21/2022 CLINICAL DATA:  Mental status change, unknown cause EXAM: CT HEAD WITHOUT CONTRAST TECHNIQUE: Contiguous axial images were obtained from the base of the skull through the vertex without intravenous contrast. RADIATION DOSE REDUCTION: This exam was performed according to the departmental dose-optimization program which includes automated exposure control, adjustment of the mA and/or kV according to patient size and/or use of iterative reconstruction technique. COMPARISON:  CT head May 7, 23. FINDINGS: Brain: No evidence of acute large vascular territory infarction, hemorrhage, hydrocephalus, extra-axial collection or mass lesion/mass effect. Patchy white matter hypodensities, nonspecific but compatible with chronic microvascular ischemic disease. Vascular: No hyperdense vessel identified. Skull: No acute fracture. Sinuses/Orbits: Largely clear sinuses.  No acute orbital findings. Other: No mastoid effusions. IMPRESSION: No evidence of acute intracranial abnormality. Electronically Signed   By: Margaretha Sheffield M.D.   On: 01/21/2022 16:14   CT Abdomen Pelvis W Contrast  Result Date: 01/21/2022 CLINICAL DATA:  Abdominal pain, hypotension, melena, guaiac-positive stool and severe anemia. EXAM: CT ABDOMEN AND PELVIS WITH CONTRAST TECHNIQUE:  Multidetector CT imaging of the abdomen and pelvis was performed using the standard protocol following bolus administration of intravenous contrast. RADIATION DOSE REDUCTION: This exam was performed according to the departmental dose-optimization program which includes automated exposure control, adjustment of the mA and/or kV according to patient size and/or use of iterative reconstruction technique. CONTRAST:  24m OMNIPAQUE IOHEXOL 300 MG/ML  SOLN COMPARISON:  Prior CT of the abdomen and pelvis on 10/11/2021 FINDINGS: Lower chest: No acute abnormality. Hepatobiliary: Stable mild biliary dilatation status post prior cholecystectomy. No evidence by CT to suggest choledocholithiasis. The liver is unremarkable. Pancreas: Unremarkable. No pancreatic ductal dilatation or surrounding inflammatory changes. Spleen: Normal in size without focal abnormality. Adrenals/Urinary Tract: Adrenal glands are unremarkable. Kidneys are normal, without renal calculi, focal lesion, or hydronephrosis. Bladder is unremarkable. Stomach/Bowel: The gastric antrum may be mildly thickened. However, the stomach is nondistended and difficult to evaluate. Bowel shows no evidence of obstruction,  ileus, inflammation or lesion. No visible ulcers. The appendix is not visualized. Diverticulosis of the sigmoid colon without evidence of acute diverticulitis. No free intraperitoneal air. Vascular/Lymphatic: Atherosclerosis of the abdominal aorta and iliac arteries without aneurysms. Reproductive: Uterus and bilateral adnexa are unremarkable. Other: No abdominal wall hernia or abnormality. No abdominopelvic ascites. Musculoskeletal: No acute or significant osseous findings. IMPRESSION: 1. Possible mild thickening of the gastric antrum. No overt ulcers are identified by CT. 2. Stable mild biliary dilatation status post prior cholecystectomy. Electronically Signed   By: Aletta Edouard M.D.   On: 01/21/2022 16:04   DG Chest Port 1 View  Result Date:  01/21/2022 CLINICAL DATA:  Sepsis. EXAM: PORTABLE CHEST 1 VIEW COMPARISON:  Chest XR, 10/11/2021.  CT chest, 06/09/2021. FINDINGS: Cardiomediastinal silhouette is within normal limits. Lungs are hyperinflated with flattening of the diaphragm. No focal consolidation or mass. No pleural effusion or pneumothorax. Cholecystectomy clips. No acute displaced fracture. IMPRESSION: Obstructive lung findings without acute superimposed cardiopulmonary process. Electronically Signed   By: Michaelle Birks M.D.   On: 01/21/2022 15:26        Scheduled Meds:  Chlorhexidine Gluconate Cloth  6 each Topical Daily   glucagon (human recombinant)       levothyroxine  50 mcg Oral Daily   lidocaine  1 patch Transdermal Q24H   nicotine  14 mg Transdermal Daily   pantoprazole (PROTONIX) IV  40 mg Intravenous Q12H   Continuous Infusions:  lactated ringers Stopped (01/22/22 0558)    Assessment & Plan:   Principal Problem:   GIB (gastrointestinal bleeding) Active Problems:   RA (rheumatoid arthritis) (HCC)   Hyperlipidemia   GERD (gastroesophageal reflux disease)   Tobacco abuse   Hypothyroid   COPD (chronic obstructive pulmonary disease) (HCC)   Hypotension   Upper GI bleed   Acute GIB Patient found with hypotension and dark-colored stool On NSAIDS and ASA, likely culprit Hemoglobin 4.5, was 10.6 on 12/23/2021 . Bun elevated. Cta/p as above Given zosyn and IVF Will continue zosyn for now GI consulted by ED Npo for now Continue IV fluids for resuscitation Will admit inpatient to stepdown unit Telemetry 2 units of packed red blood cells have been ordered by ED, may need more.  We will check H&H after second unit IV PPI twice daily Per ER they spoke to GI Dr. Allen Norris and he recommended surgery to get involved.  Also recommended FFP, which is ordered by ED Transfuse to keep Hg around 8 8/18 H&H stable  status post EGD found with active small bowel bleed.  Had clips but not completely stopped.  GI  recommended vascular surgery for embolization near the clips.  Vascular surgery consulted Dr. Delana Meyer is made aware   2.  Hypotension Due to hypovolemia from GI bleed 8/18 improved With IV fluids   3. GERD Continue IV PPI   4. Hyperlipidemia Holding statins since n.p.o. for procedure this a.m.   5. Hypothryoid On synthroid. Continue     6.COPD On RA. No acute exacerbation   7. Tobacco abuse Patient continues to smoke.  Counseled patient extensively on smoking cessation Nicotine patch   8.RA On methotrexate weekly.    DVT prophylaxis: SCD Code Status: DNR Family Communication: Brother at bedside Disposition Plan:  Status is: Inpatient Remains inpatient appropriate because: IV treatment.  Work-up pending        LOS: 1 day   Time spent: 35 min    Nolberto Hanlon, MD [facility] Pager [phone]  If 7PM-7AM, please  contact night-coverage 01/22/2022, 3:31 PM

## 2022-01-22 NOTE — Anesthesia Preprocedure Evaluation (Signed)
Anesthesia Evaluation  Patient identified by MRN, date of birth, ID band Patient awake    Reviewed: Allergy & Precautions, NPO status , Patient's Chart, lab work & pertinent test results  History of Anesthesia Complications (+) PONV and history of anesthetic complications  Airway Mallampati: III  TM Distance: >3 FB Neck ROM: full    Dental  (+) Chipped, Implants   Pulmonary COPD, Current Smoker,    Pulmonary exam normal        Cardiovascular Exercise Tolerance: Good (-) anginanegative cardio ROS Normal cardiovascular exam     Neuro/Psych PSYCHIATRIC DISORDERS TIA   GI/Hepatic Neg liver ROS, GERD  Controlled,  Endo/Other  Hypothyroidism   Renal/GU negative Renal ROS  negative genitourinary   Musculoskeletal   Abdominal   Peds  Hematology  (+) Blood dyscrasia, anemia ,   Anesthesia Other Findings Past Medical History: No date: Abdominal pain No date: Anemia No date: Anxiety No date: Arthritis, rheumatoid (HCC) No date: Breast pain No date: Chest discomfort No date: Chest pain No date: Chronic pain syndrome No date: Collagen vascular disease (HCC)     Comment:  hx rheumatoid arthritis.  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 No date: Tobacco abuse  Past Surgical History: No date: CHOLECYSTECTOMY No date: JOINT REPLACEMENT     Comment:  right hip No date: LEG SURGERY No date: TONSILLECTOMY  BMI    Body Mass Index: 15.74 kg/m      Reproductive/Obstetrics negative OB ROS                              Anesthesia Physical Anesthesia Plan  ASA: 3  Anesthesia Plan: General   Post-op Pain Management:    Induction: Intravenous  PONV Risk Score and Plan: Propofol infusion and TIVA  Airway Management Planned: Natural Airway and Nasal Cannula  Additional Equipment:   Intra-op Plan:   Post-operative Plan:   Informed Consent: I have reviewed the patients History and Physical, chart, labs and discussed the procedure including the risks, benefits and alternatives for the proposed anesthesia with the patient or authorized representative who has indicated his/her understanding and acceptance.   Patient has DNR.  Discussed DNR with patient and Suspend DNR.   Dental Advisory Given  Plan Discussed with: Anesthesiologist, CRNA and Surgeon  Anesthesia Plan Comments: (Patient consented for risks of anesthesia including but not limited to:  - adverse reactions to medications - risk of airway placement if required - damage to eyes, teeth, lips or other oral mucosa - nerve damage due to positioning  - sore throat or hoarseness - Damage to heart, brain, nerves, lungs, other parts of body or loss of life  Patient voiced understanding.)        Anesthesia Quick Evaluation

## 2022-01-22 NOTE — Consult Note (Signed)
[facility] SURGICAL CONSULTATION NOTE (initial) - cpt: 53664   HISTORY OF PRESENT ILLNESS (HPI):  81 y.o. female presented to [facility] ED yesterday for evaluation of abdominal pain and weakness. Patient reportedly has been having intermittent upper abdominal pain and progressively worsening weakness over the course of the last week. She tried Bentyl intermittently without improvement. Associated nausea and reportedly black stools over this time as well. No fever, chills, SOB, CP, cough, dizziness. She denied any chronic use of NSAIDs nor BC powders. She denied any history of GI bleeding nor ulcers. She is not on anticoagulation aside from 81 mg ASA. Previous intra-abdominal surgeries positive for cholecystectomy. Work up in the ED revealed a Hgb to 4.5, PLT 203K, sCr normal at 0.80, INR 1.4, lactic acidosis to 3.4. CT Abdomen/Pelvis showed possible thickening over the gastric antrum but was otherwise unremarkable. She was admitted to the medicine service and transfused 2 units pRBCs and FFP. Gastroenterology on board and awaiting stabilization prior to endoscopic evaluation.   Surgery is consulted by emergency physician Dr. Merlyn Lot in this context for evaluation and management of severe anemia in setting of GI bleeding without clear source.  This morning, patient reports she feels about the same. Still with epigastric abdominal pain and feeling weak. Her Hgb has improved to 7.8./ She is more hemodynamically stable. She has not needed vasopressor support.    PAST MEDICAL HISTORY (PMH):  Past Medical History:  Diagnosis Date   Abdominal pain    Anemia    Anxiety    Arthritis, rheumatoid (HCC)    Breast pain    Chest discomfort    Chest pain    Chronic pain syndrome    Collagen vascular disease (HCC)    hx rheumatoid arthritis.    Constipation    COPD (chronic obstructive pulmonary disease) (HCC)    Diverticulosis    Fibrocystic breast disease    Gastritis    GERD  (gastroesophageal reflux disease)    Hemorrhoids    Hx of colonic polyps    Hypothyroidism    Osteopenia    Renal artery stenosis (HCC)    Right ankle pain    Thyroid binding globulin deficiency    Tobacco abuse      PAST SURGICAL HISTORY ([place]):  Past Surgical History:  Procedure Laterality Date   CHOLECYSTECTOMY     JOINT REPLACEMENT     right hip   LEG SURGERY     TONSILLECTOMY       MEDICATIONS:  Prior to Admission medications   Medication Sig Start Date End Date Taking? Authorizing Provider  aspirin EC 81 MG EC tablet Take 1 tablet (81 mg total) by mouth daily. Swallow whole. 12/09/19  Yes Hongalgi, Lenis Dickinson, MD  atorvastatin (LIPITOR) 10 MG tablet Take 10 mg by mouth daily. 07/22/21  Yes [provider]  buPROPion (WELLBUTRIN XL) 150 MG 24 hr tablet Take 150 mg by mouth daily. 11/22/19  Yes [provider]  DULoxetine (CYMBALTA) 60 MG capsule Take 60 mg by mouth daily.   Yes [provider]  folic acid (FOLVITE) 1 MG tablet Take 1 tablet by mouth daily. 10/21/14  Yes [provider]  galantamine (RAZADYNE) 8 MG tablet Take 8 mg by mouth daily.   Yes [provider]  levothyroxine (SYNTHROID) 50 MCG tablet Take 50 mcg by mouth daily.    Yes [provider]  omeprazole (PRILOSEC) 40 MG capsule Take 40 mg by mouth in the morning and at bedtime.  Yes [provider]  tiZANidine (ZANAFLEX) 2 MG tablet Take 2 mg by mouth 2 (two) times daily. 12/03/19  Yes [provider]  dicyclomine (BENTYL) 10 MG capsule Take 10 mg by mouth 2 (two) times daily as needed. 09/30/21   [provider]  etodolac (LODINE) 400 MG tablet Take 400 mg by mouth 2 (two) times daily. 09/22/21   [provider]  lidocaine (LIDODERM) 5 % Place 1 patch onto the skin every 12 (twelve) hours. Remove & Discard patch within 12 hours or as directed by MD Patient not taking: Reported on 12/23/2021 10/12/21 10/12/22  Blake Divine, MD   methotrexate 250 MG/10ML injection ADMINISTER 0.8 ML ONCE A WEEK 02/27/20   [provider]  ondansetron (ZOFRAN-ODT) 4 MG disintegrating tablet Take 1 tablet (4 mg total) by mouth every 8 (eight) hours as needed for nausea or vomiting. Patient not taking: Reported on 01/21/2022 10/12/21   Blake Divine, MD     ALLERGIES:  Allergies  Allergen Reactions   Other Other (See Comments)    Uncoded Allergy. Allergen: symmetral Uncoded Allergy. Allergen: etrafon Uncoded Allergy. Allergen: desbutal Uncoded Allergy. Allergen: symmetral Uncoded Allergy. Allergen: etrafon Uncoded Allergy. Allergen: desbutal Other reaction(s): Unknown Uncoded Allergy. Allergen: symmetral Other reaction(s): Unknown Uncoded Allergy. Allergen: etrafon Uncoded Allergy. Allergen: symmetral Uncoded Allergy. Allergen: etrafon Uncoded Allergy. Allergen: desbutal    Sulfa Antibiotics     Other reaction(s): Unknown Uncoded Allergy. Allergen: desbutal   Denosumab Itching   Etrafon [Perphenazine-Amitriptyline]    Hydrocodone Nausea And Vomiting   Mirtazapine     Other reaction(s): Hallucination   Doxycycline Nausea Only   Percocet [Oxycodone-Acetaminophen] Nausea And Vomiting   Sertraline Palpitations     SOCIAL HISTORY:  Social History   Socioeconomic History   Marital status: Single    Spouse name: Not on file   Number of children: Not on file   Years of education: Not on file   Highest education level: Not on file  Occupational History   Not on file  Tobacco Use   Smoking status: Some Days    Packs/day: 0.10    Types: Cigarettes   Smokeless tobacco: Never   Tobacco comments:    Pt maybe has 1 cig a  month  Vaping Use   Vaping Use: Never used  Substance and Sexual Activity   Alcohol use: No   Drug use: No   Sexual activity: Not Currently  Other Topics Concern   Not on file  Social History Narrative   Not on file   Social Determinants of Health   Financial Resource Strain: Not on  file  Food Insecurity: Not on file  Transportation Needs: Not on file  Physical Activity: Not on file  Stress: Not on file  Social Connections: Not on file  Intimate Partner Violence: Not on file     FAMILY HISTORY:  Family History  Problem Relation Age of Onset   Heart disease Sister    Hypertension Brother    Heart attack Brother    Hyperlipidemia Brother    Breast cancer Neg Hx       REVIEW OF SYSTEMS:  Review of Systems  Constitutional:  Negative for chills and fever.  HENT:  Negative for congestion and sore throat.   Respiratory:  Negative for cough and shortness of breath.   Cardiovascular:  Negative for chest pain and palpitations.  Gastrointestinal:  Positive for abdominal pain, blood in stool, melena and nausea. Negative for vomiting.  Neurological:  Positive for  weakness. Negative for dizziness and focal weakness.  All other systems reviewed and are negative.   VITAL SIGNS:  Temp:  [97.8 F (36.6 C)-99.4 F (37.4 C)] 98.1 F (36.7 C) (08/18 0458) Pulse Rate:  [41-99] 48 (08/18 0630) Resp:  [18-31] 26 (08/18 0630) BP: (74-131)/(39-106) 104/71 (08/18 0630) SpO2:  [94 %-100 %] 94 % (08/18 0630) Weight:  [41.6 kg-52.2 kg] 41.6 kg (08/17 1900)     Height: '5\' 4"'$  (162.6 cm) Weight: 41.6 kg BMI (Calculated): 15.73   INTAKE/OUTPUT:  08/17 0701 - 08/18 0700 In: 2785.7 [I.V.:193.1; Blood:2542.3; IV Piggyback:50.3] Out: 600 [Urine:600]  PHYSICAL EXAM:  Physical Exam Vitals and nursing note reviewed.  Constitutional:      General: She is not in acute distress.    Appearance: She is well-developed and normal weight. She is not ill-appearing.     Comments: Patient resting in bed; NAD  HENT:     Head: Normocephalic and atraumatic.  Eyes:     General: No scleral icterus.    Extraocular Movements: Extraocular movements intact.  Cardiovascular:     Rate and Rhythm: Normal rate. Rhythm irregular.  Pulmonary:     Effort: Pulmonary effort is normal. No respiratory  distress.  Abdominal:     General: Abdomen is flat. A surgical scar is present. There is no distension.     Palpations: Abdomen is soft.     Tenderness: There is abdominal tenderness in the epigastric area. There is no guarding or rebound.     Comments: Abdomen is soft, she has mild epigastric soreness, non-distended, no rebound/guarding. She is  certainly without peritonitis  Genitourinary:    Comments: Deferred Skin:    General: Skin is warm and dry.     Coloration: Skin is pale. Skin is not jaundiced.  Neurological:     General: No focal deficit present.     Mental Status: She is alert and oriented to person, place, and time.  Psychiatric:        Mood and Affect: Mood normal.        Behavior: Behavior normal.      Labs:     Latest Ref Rng & Units 01/22/2022    2:01 AM 01/21/2022    2:55 PM 12/23/2021   12:42 PM  CBC  WBC 4.0 - 10.5 K/uL 9.4  8.6  6.6   Hemoglobin 12.0 - 15.0 g/dL 7.8  4.5  10.6   Hematocrit 36.0 - 46.0 % 23.4  14.2  33.3   Platelets 150 - 400 K/uL 208  203  282       Latest Ref Rng & Units 01/21/2022    2:55 PM 10/12/2021    5:25 AM 10/11/2021   10:55 PM  CMP  Glucose 70 - 99 mg/dL 93  100    BUN 8 - 23 mg/dL 39  23    Creatinine 0.44 - 1.00 mg/dL 0.80  0.96    Sodium 135 - 145 mmol/L 135  137    Potassium 3.5 - 5.1 mmol/L 3.6  3.6    Chloride 98 - 111 mmol/L 106  105    CO2 22 - 32 mmol/L 24  26    Calcium 8.9 - 10.3 mg/dL 7.5  8.1    Total Protein 6.5 - 8.1 g/dL 4.8   5.9   Total Bilirubin 0.3 - 1.2 mg/dL 0.5   0.7   Alkaline Phos 38 - 126 U/L 41   59   AST 15 - 41 U/L 19  25   ALT 0 - 44 U/L 13   17      Imaging studies:   CT Abdomen/Pelvis (01/21/2022) personally reviewed and agree with radiologist report reviewed below:  IMPRESSION: 1. Possible mild thickening of the gastric antrum. No overt ulcers are identified by CT. 2. Stable mild biliary dilatation status post prior cholecystectomy.   Assessment/Plan: (ICD-10's: K63.2) 81 y.o.  female with progressive weakness and upper abdominal pain found to be severely anemic with melena concerning for GI bleeding, suspect upper   - Appreciate medicine admission - She has responded to 2 units pRBCs and FFP - Appreciate GI involvement; she will benefit from endoscopic evaluation once felt safe to do so - She does not need emergent surgical interventions at this time. We will of course follow along. Ideally, we would need identified source that has failed other minimally invasive modalities of hemostasis prior to proceeding with surgery.    - NPO + IVF support - Monitor H&H; improving; transfuse as needed   - Agree with PPI - Pain control prn; antiemetic prn - Further management per primary and consulting services; we will follow    All of the above findings and recommendations were discussed with the patient, and all of patient's questions were answered to her expressed satisfaction.  Thank you for the opportunity to participate in this patient's care.   -- Edison Simon, PA-C [facility] 01/22/2022, 7:55 AM M-F: 7am - 4pm

## 2022-01-22 NOTE — Progress Notes (Signed)
Brief progress note:  This is to see the patient for melena.  The patient has not had any further black stools except a small amount yesterday.  The patient denies taking NSAIDs.  The patient came in with a hemoglobin of 4.5 but was transfused and now has a hemoglobin of 7.8.  One month ago ago she was 10.6.  The patient will have a upper endoscopy today to look for source of bleeding.  Dr. Allen Norris

## 2022-01-22 NOTE — H&P (View-Only) (Signed)
[facility]. A  medical group               MRN : [id number]  [name] is a 81 y.o. (June 26, 1940) female who presents with chief complaint of check circulation.  History of Present Illness:  I am asked by Dr. Kurtis Bushman to evaluate the patient in the face of ongoing GI bleeding.  The patient is an 81 year old woman who was admitted to the hospital with profound anemia and hypotension.  Admission hemoglobin was noted to be 4.5 status post 2 units of packed red blood cells transfusion.  Earlier today she was taken to endoscopy where EGD was performed.  Bleeding was identified coming from the third and fourth portions of the duodenum although a specific site could not be identified.  Clips were placed.  At this point she has been hemodynamically stable and I am asked to evaluate for possible angiography with embolization.  Current Meds  Medication Sig   aspirin EC 81 MG EC tablet Take 1 tablet (81 mg total) by mouth daily. Swallow whole.   atorvastatin (LIPITOR) 10 MG tablet Take 10 mg by mouth daily.   buPROPion (WELLBUTRIN XL) 150 MG 24 hr tablet Take 150 mg by mouth daily.   DULoxetine (CYMBALTA) 60 MG capsule Take 60 mg by mouth daily.   folic acid (FOLVITE) 1 MG tablet Take 1 tablet by mouth daily.   galantamine (RAZADYNE) 8 MG tablet Take 8 mg by mouth daily.   levothyroxine (SYNTHROID) 50 MCG tablet Take 50 mcg by mouth daily.    omeprazole (PRILOSEC) 40 MG capsule Take 40 mg by mouth in the morning and at bedtime.   tiZANidine (ZANAFLEX) 2 MG tablet Take 2 mg by mouth 2 (two) times daily.    Past Medical History:  Diagnosis Date   Abdominal pain    Anemia    Anxiety    Arthritis, rheumatoid (HCC)    Breast pain    Chest discomfort    Chest pain    Chronic pain syndrome    Collagen vascular disease (HCC)    hx rheumatoid arthritis.    Constipation    COPD (chronic obstructive pulmonary disease) (HCC)    Diverticulosis     Fibrocystic breast disease    Gastritis    GERD (gastroesophageal reflux disease)    Hemorrhoids    Hx of colonic polyps    Hypothyroidism    Osteopenia    PONV (postoperative nausea and vomiting)    Renal artery stenosis (HCC)    Right ankle pain    Thyroid binding globulin deficiency    Tobacco abuse     Past Surgical History:  Procedure Laterality Date   CHOLECYSTECTOMY     ESOPHAGOGASTRODUODENOSCOPY (EGD) WITH PROPOFOL N/A 01/22/2022   Procedure: ESOPHAGOGASTRODUODENOSCOPY (EGD) WITH PROPOFOL;  Surgeon: Lucilla Lame, MD;  Location: [facility] ENDOSCOPY;  Service: Endoscopy;  Laterality: N/A;   JOINT REPLACEMENT     right hip   LEG SURGERY     TONSILLECTOMY      Social History Social History   Tobacco Use   Smoking status: Some Days    Packs/day: 0.10    Types: Cigarettes   Smokeless tobacco: Never   Tobacco comments:    Pt maybe has 1 cig a  month  Vaping Use   Vaping Use: Never used  Substance Use Topics   Alcohol use: No   Drug use: No    Family History Family History  Problem Relation Age  of Onset   Heart disease Sister    Hypertension Brother    Heart attack Brother    Hyperlipidemia Brother    Breast cancer Neg Hx     Allergies  Allergen Reactions   Other Other (See Comments)    Uncoded Allergy. Allergen: symmetral Uncoded Allergy. Allergen: etrafon Uncoded Allergy. Allergen: desbutal Uncoded Allergy. Allergen: symmetral Uncoded Allergy. Allergen: etrafon Uncoded Allergy. Allergen: desbutal Other reaction(s): Unknown Uncoded Allergy. Allergen: symmetral Other reaction(s): Unknown Uncoded Allergy. Allergen: etrafon Uncoded Allergy. Allergen: symmetral Uncoded Allergy. Allergen: etrafon Uncoded Allergy. Allergen: desbutal    Sulfa Antibiotics     Other reaction(s): Unknown Uncoded Allergy. Allergen: desbutal   Denosumab Itching   Etrafon [Perphenazine-Amitriptyline]    Hydrocodone Nausea And Vomiting   Mirtazapine     Other reaction(s):  Hallucination   Doxycycline Nausea Only   Percocet [Oxycodone-Acetaminophen] Nausea And Vomiting   Sertraline Palpitations     REVIEW OF SYSTEMS (Negative unless checked)  Constitutional: '[]'$ Weight loss  '[]'$ Fever  '[]'$ Chills Cardiac: '[]'$ Chest pain   '[]'$ Chest pressure   '[]'$ Palpitations   '[]'$ Shortness of breath when laying flat   '[]'$ Shortness of breath with exertion. Vascular:  '[x]'$ Pain in legs with walking   '[]'$ Pain in legs at rest  '[]'$ History of DVT   '[]'$ Phlebitis   '[]'$ Swelling in legs   '[]'$ Varicose veins   '[]'$ Non-healing ulcers Pulmonary:   '[]'$ Uses home oxygen   '[]'$ Productive cough   '[]'$ Hemoptysis   '[]'$ Wheeze  '[]'$ COPD   '[]'$ Asthma Neurologic:  '[]'$ Dizziness   '[]'$ Seizures   '[]'$ History of stroke   '[]'$ History of TIA  '[]'$ Aphasia   '[]'$ Vissual changes   '[]'$ Weakness or numbness in arm   '[]'$ Weakness or numbness in leg Musculoskeletal:   '[]'$ Joint swelling   '[]'$ Joint pain   '[]'$ Low back pain Hematologic:  '[]'$ Easy bruising  '[]'$ Easy bleeding   '[]'$ Hypercoagulable state   '[]'$ Anemic Gastrointestinal:  '[]'$ Diarrhea   '[]'$ Vomiting  '[]'$ Gastroesophageal reflux/heartburn   '[]'$ Difficulty swallowing. Genitourinary:  '[]'$ Chronic kidney disease   '[]'$ Difficult urination  '[]'$ Frequent urination   '[]'$ Blood in urine Skin:  '[]'$ Rashes   '[]'$ Ulcers  Psychological:  '[]'$ History of anxiety   '[]'$  History of major depression.  Physical Examination  Vitals:   01/22/22 1800 01/22/22 1801 01/22/22 1805 01/22/22 1805  BP: 138/79  (!) 145/72 (!) 145/72  Pulse: 93 92 97 94  Resp: (!) 23  (!) 21 (!) 22  Temp:      TempSrc:      SpO2: 100%  100% 100%  Weight:      Height:       Body mass index is 15.74 kg/m. Gen: WD/WN, NAD Head: [place]/AT, No temporalis wasting.  Ear/Nose/Throat: Hearing grossly intact, nares w/o erythema or drainage Eyes: PER, EOMI, sclera nonicteric.  Neck: Supple, no masses.  No bruit or JVD.  Pulmonary:  Good air movement, no audible wheezing, no use of accessory muscles.  Cardiac: RRR, normal S1, S2, no Murmurs. Vascular:   Vessel Right Left  Radial  Palpable Palpable  Gastrointestinal: soft, non-distended. No guarding/no peritoneal signs.  Musculoskeletal: M/S 5/5 throughout.  No visible deformity.  Neurologic: CN 2-12 intact. Pain and light touch intact in extremities.  Symmetrical.  Speech is fluent. Motor exam as listed above. Psychiatric: Judgment intact, Mood & affect appropriate for pt's clinical situation. Dermatologic: No rashes or ulcers noted.  No changes consistent with cellulitis.   CBC Lab Results  Component Value Date   WBC 9.4 01/22/2022   HGB 7.4 (L) 01/22/2022   HCT 22.4 (L) 01/22/2022   MCV  90.7 01/22/2022   PLT 208 01/22/2022    BMET    Component Value Date/Time   NA 135 01/21/2022 1455   K 3.6 01/21/2022 1455   CL 106 01/21/2022 1455   CO2 24 01/21/2022 1455   GLUCOSE 93 01/21/2022 1455   BUN 39 (H) 01/21/2022 1455   CREATININE 0.80 01/21/2022 1455   CALCIUM 7.5 (L) 01/21/2022 1455   GFRNONAA >60 01/21/2022 1455   GFRAA >60 12/07/2019 1310   Estimated Creatinine Clearance: 36.8 mL/min (by C-G formula based on SCr of 0.8 mg/dL).  COAG Lab Results  Component Value Date   INR 1.4 (H) 01/21/2022    Radiology PERIPHERAL VASCULAR CATHETERIZATION  Result Date: 01/22/2022 See surgical note for result.  CT Head Wo Contrast  Result Date: 01/21/2022 CLINICAL DATA:  Mental status change, unknown cause EXAM: CT HEAD WITHOUT CONTRAST TECHNIQUE: Contiguous axial images were obtained from the base of the skull through the vertex without intravenous contrast. RADIATION DOSE REDUCTION: This exam was performed according to the departmental dose-optimization program which includes automated exposure control, adjustment of the mA and/or kV according to patient size and/or use of iterative reconstruction technique. COMPARISON:  CT head May 7, 23. FINDINGS: Brain: No evidence of acute large vascular territory infarction, hemorrhage, hydrocephalus, extra-axial collection or mass lesion/mass effect. Patchy white matter  hypodensities, nonspecific but compatible with chronic microvascular ischemic disease. Vascular: No hyperdense vessel identified. Skull: No acute fracture. Sinuses/Orbits: Largely clear sinuses.  No acute orbital findings. Other: No mastoid effusions. IMPRESSION: No evidence of acute intracranial abnormality. Electronically Signed   By: Margaretha Sheffield M.D.   On: 01/21/2022 16:14   CT Abdomen Pelvis W Contrast  Result Date: 01/21/2022 CLINICAL DATA:  Abdominal pain, hypotension, melena, guaiac-positive stool and severe anemia. EXAM: CT ABDOMEN AND PELVIS WITH CONTRAST TECHNIQUE: Multidetector CT imaging of the abdomen and pelvis was performed using the standard protocol following bolus administration of intravenous contrast. RADIATION DOSE REDUCTION: This exam was performed according to the departmental dose-optimization program which includes automated exposure control, adjustment of the mA and/or kV according to patient size and/or use of iterative reconstruction technique. CONTRAST:  59m OMNIPAQUE IOHEXOL 300 MG/ML  SOLN COMPARISON:  Prior CT of the abdomen and pelvis on 10/11/2021 FINDINGS: Lower chest: No acute abnormality. Hepatobiliary: Stable mild biliary dilatation status post prior cholecystectomy. No evidence by CT to suggest choledocholithiasis. The liver is unremarkable. Pancreas: Unremarkable. No pancreatic ductal dilatation or surrounding inflammatory changes. Spleen: Normal in size without focal abnormality. Adrenals/Urinary Tract: Adrenal glands are unremarkable. Kidneys are normal, without renal calculi, focal lesion, or hydronephrosis. Bladder is unremarkable. Stomach/Bowel: The gastric antrum may be mildly thickened. However, the stomach is nondistended and difficult to evaluate. Bowel shows no evidence of obstruction, ileus, inflammation or lesion. No visible ulcers. The appendix is not visualized. Diverticulosis of the sigmoid colon without evidence of acute diverticulitis. No free  intraperitoneal air. Vascular/Lymphatic: Atherosclerosis of the abdominal aorta and iliac arteries without aneurysms. Reproductive: Uterus and bilateral adnexa are unremarkable. Other: No abdominal wall hernia or abnormality. No abdominopelvic ascites. Musculoskeletal: No acute or significant osseous findings. IMPRESSION: 1. Possible mild thickening of the gastric antrum. No overt ulcers are identified by CT. 2. Stable mild biliary dilatation status post prior cholecystectomy. Electronically Signed   By: GAletta EdouardM.D.   On: 01/21/2022 16:04   DG Chest Port 1 View  Result Date: 01/21/2022 CLINICAL DATA:  Sepsis. EXAM: PORTABLE CHEST 1 VIEW COMPARISON:  Chest XR, 10/11/2021.  CT chest, 06/09/2021.  FINDINGS: Cardiomediastinal silhouette is within normal limits. Lungs are hyperinflated with flattening of the diaphragm. No focal consolidation or mass. No pleural effusion or pneumothorax. Cholecystectomy clips. No acute displaced fracture. IMPRESSION: Obstructive lung findings without acute superimposed cardiopulmonary process. Electronically Signed   By: Michaelle Birks M.D.   On: 01/21/2022 15:26     Assessment/Plan Upper GI bleed: Under the circumstances angiography with the hope for embolization is warranted.  The only other alternative would be to move forward with surgical intervention and given the location of the bleed this would be a fairly morbid operation.  The risks of embolization are certainly relatively low but this is a very difficult location to treat.  This was explained to the patient's daughter as the patient has undergone anesthesia today.  Consent was obtained from the daughter and we will move forward with angiography and hopefully embolization.  Risk and benefits were reviewed all questions answered family wishes for [place] to proceed.  2.  Hypotension Due to anemia of blood loss from GI bleed Continue IV fluids keep map above 65 Monitor serial H&H transfuse as indicated per the  hospitalist service   3. GERD Started on IV ppi for above   4. Hyperlipidemia Hold statin (home med)since npo   5. Hypothryoid On synthroid. Continue   [name], MD  01/22/2022 6:13 PM

## 2022-01-22 NOTE — Consult Note (Signed)
[name]. [facility]               MRN : [id number]  [name] is a 81 y.o. (February 25, 1941) female who presents with chief complaint of check circulation.  History of Present Illness:  I am asked by Dr. Kurtis Bushman to evaluate the patient in the face of ongoing GI bleeding.  The patient is an 81 year old woman who was admitted to the hospital with profound anemia and hypotension.  Admission hemoglobin was noted to be 4.5 status post 2 units of packed red blood cells transfusion.  Earlier today she was taken to endoscopy where EGD was performed.  Bleeding was identified coming from the third and fourth portions of the duodenum although a specific site could not be identified.  Clips were placed.  At this point she has been hemodynamically stable and I am asked to evaluate for possible angiography with embolization.  Current Meds  Medication Sig   aspirin EC 81 MG EC tablet Take 1 tablet (81 mg total) by mouth daily. Swallow whole.   atorvastatin (LIPITOR) 10 MG tablet Take 10 mg by mouth daily.   buPROPion (WELLBUTRIN XL) 150 MG 24 hr tablet Take 150 mg by mouth daily.   DULoxetine (CYMBALTA) 60 MG capsule Take 60 mg by mouth daily.   folic acid (FOLVITE) 1 MG tablet Take 1 tablet by mouth daily.   galantamine (RAZADYNE) 8 MG tablet Take 8 mg by mouth daily.   levothyroxine (SYNTHROID) 50 MCG tablet Take 50 mcg by mouth daily.    omeprazole (PRILOSEC) 40 MG capsule Take 40 mg by mouth in the morning and at bedtime.   tiZANidine (ZANAFLEX) 2 MG tablet Take 2 mg by mouth 2 (two) times daily.    Past Medical History:  Diagnosis Date   Abdominal pain    Anemia    Anxiety    Arthritis, rheumatoid (HCC)    Breast pain    Chest discomfort    Chest pain    Chronic pain syndrome    Collagen vascular disease (HCC)    hx rheumatoid arthritis.    Constipation    COPD (chronic obstructive pulmonary disease) (HCC)    Diverticulosis     Fibrocystic breast disease    Gastritis    GERD (gastroesophageal reflux disease)    Hemorrhoids    Hx of colonic polyps    Hypothyroidism    Osteopenia    PONV (postoperative nausea and vomiting)    Renal artery stenosis (HCC)    Right ankle pain    Thyroid binding globulin deficiency    Tobacco abuse     Past Surgical History:  Procedure Laterality Date   CHOLECYSTECTOMY     ESOPHAGOGASTRODUODENOSCOPY (EGD) WITH PROPOFOL N/A 01/22/2022   Procedure: ESOPHAGOGASTRODUODENOSCOPY (EGD) WITH PROPOFOL;  Surgeon: Lucilla Lame, MD;  Location: [facility] ENDOSCOPY;  Service: Endoscopy;  Laterality: N/A;   JOINT REPLACEMENT     right hip   LEG SURGERY     TONSILLECTOMY      Social History Social History   Tobacco Use   Smoking status: Some Days    Packs/day: 0.10    Types: Cigarettes   Smokeless tobacco: Never   Tobacco comments:    Pt maybe has 1 cig a  month  Vaping Use   Vaping Use: Never used  Substance Use Topics   Alcohol use: No   Drug use: No    Family History Family History  Problem Relation Age  of Onset   Heart disease Sister    Hypertension Brother    Heart attack Brother    Hyperlipidemia Brother    Breast cancer Neg Hx     Allergies  Allergen Reactions   Other Other (See Comments)    Uncoded Allergy. Allergen: symmetral Uncoded Allergy. Allergen: etrafon Uncoded Allergy. Allergen: desbutal Uncoded Allergy. Allergen: symmetral Uncoded Allergy. Allergen: etrafon Uncoded Allergy. Allergen: desbutal Other reaction(s): Unknown Uncoded Allergy. Allergen: symmetral Other reaction(s): Unknown Uncoded Allergy. Allergen: etrafon Uncoded Allergy. Allergen: symmetral Uncoded Allergy. Allergen: etrafon Uncoded Allergy. Allergen: desbutal    Sulfa Antibiotics     Other reaction(s): Unknown Uncoded Allergy. Allergen: desbutal   Denosumab Itching   Etrafon [Perphenazine-Amitriptyline]    Hydrocodone Nausea And Vomiting   Mirtazapine     Other reaction(s):  Hallucination   Doxycycline Nausea Only   Percocet [Oxycodone-Acetaminophen] Nausea And Vomiting   Sertraline Palpitations     REVIEW OF SYSTEMS (Negative unless checked)  Constitutional: '[]'$ Weight loss  '[]'$ Fever  '[]'$ Chills Cardiac: '[]'$ Chest pain   '[]'$ Chest pressure   '[]'$ Palpitations   '[]'$ Shortness of breath when laying flat   '[]'$ Shortness of breath with exertion. Vascular:  '[x]'$ Pain in legs with walking   '[]'$ Pain in legs at rest  '[]'$ History of DVT   '[]'$ Phlebitis   '[]'$ Swelling in legs   '[]'$ Varicose veins   '[]'$ Non-healing ulcers Pulmonary:   '[]'$ Uses home oxygen   '[]'$ Productive cough   '[]'$ Hemoptysis   '[]'$ Wheeze  '[]'$ COPD   '[]'$ Asthma Neurologic:  '[]'$ Dizziness   '[]'$ Seizures   '[]'$ History of stroke   '[]'$ History of TIA  '[]'$ Aphasia   '[]'$ Vissual changes   '[]'$ Weakness or numbness in arm   '[]'$ Weakness or numbness in leg Musculoskeletal:   '[]'$ Joint swelling   '[]'$ Joint pain   '[]'$ Low back pain Hematologic:  '[]'$ Easy bruising  '[]'$ Easy bleeding   '[]'$ Hypercoagulable state   '[]'$ Anemic Gastrointestinal:  '[]'$ Diarrhea   '[]'$ Vomiting  '[]'$ Gastroesophageal reflux/heartburn   '[]'$ Difficulty swallowing. Genitourinary:  '[]'$ Chronic kidney disease   '[]'$ Difficult urination  '[]'$ Frequent urination   '[]'$ Blood in urine Skin:  '[]'$ Rashes   '[]'$ Ulcers  Psychological:  '[]'$ History of anxiety   '[]'$  History of major depression.  Physical Examination  Vitals:   01/22/22 1800 01/22/22 1801 01/22/22 1805 01/22/22 1805  BP: 138/79  (!) 145/72 (!) 145/72  Pulse: 93 92 97 94  Resp: (!) 23  (!) 21 (!) 22  Temp:      TempSrc:      SpO2: 100%  100% 100%  Weight:      Height:       Body mass index is 15.74 kg/m. Gen: WD/WN, NAD Head: [place]/AT, No temporalis wasting.  Ear/Nose/Throat: Hearing grossly intact, nares w/o erythema or drainage Eyes: PER, EOMI, sclera nonicteric.  Neck: Supple, no masses.  No bruit or JVD.  Pulmonary:  Good air movement, no audible wheezing, no use of accessory muscles.  Cardiac: RRR, normal S1, S2, no Murmurs. Vascular:   Vessel Right Left  Radial  Palpable Palpable  Gastrointestinal: soft, non-distended. No guarding/no peritoneal signs.  Musculoskeletal: M/S 5/5 throughout.  No visible deformity.  Neurologic: CN 2-12 intact. Pain and light touch intact in extremities.  Symmetrical.  Speech is fluent. Motor exam as listed above. Psychiatric: Judgment intact, Mood & affect appropriate for pt's clinical situation. Dermatologic: No rashes or ulcers noted.  No changes consistent with cellulitis.   CBC Lab Results  Component Value Date   WBC 9.4 01/22/2022   HGB 7.4 (L) 01/22/2022   HCT 22.4 (L) 01/22/2022   MCV  90.7 01/22/2022   PLT 208 01/22/2022    BMET    Component Value Date/Time   NA 135 01/21/2022 1455   K 3.6 01/21/2022 1455   CL 106 01/21/2022 1455   CO2 24 01/21/2022 1455   GLUCOSE 93 01/21/2022 1455   BUN 39 (H) 01/21/2022 1455   CREATININE 0.80 01/21/2022 1455   CALCIUM 7.5 (L) 01/21/2022 1455   GFRNONAA >60 01/21/2022 1455   GFRAA >60 12/07/2019 1310   Estimated Creatinine Clearance: 36.8 mL/min (by C-G formula based on SCr of 0.8 mg/dL).  COAG Lab Results  Component Value Date   INR 1.4 (H) 01/21/2022    Radiology PERIPHERAL VASCULAR CATHETERIZATION  Result Date: 01/22/2022 See surgical note for result.  CT Head Wo Contrast  Result Date: 01/21/2022 CLINICAL DATA:  Mental status change, unknown cause EXAM: CT HEAD WITHOUT CONTRAST TECHNIQUE: Contiguous axial images were obtained from the base of the skull through the vertex without intravenous contrast. RADIATION DOSE REDUCTION: This exam was performed according to the departmental dose-optimization program which includes automated exposure control, adjustment of the mA and/or kV according to patient size and/or use of iterative reconstruction technique. COMPARISON:  CT head May 7, 23. FINDINGS: Brain: No evidence of acute large vascular territory infarction, hemorrhage, hydrocephalus, extra-axial collection or mass lesion/mass effect. Patchy white matter  hypodensities, nonspecific but compatible with chronic microvascular ischemic disease. Vascular: No hyperdense vessel identified. Skull: No acute fracture. Sinuses/Orbits: Largely clear sinuses.  No acute orbital findings. Other: No mastoid effusions. IMPRESSION: No evidence of acute intracranial abnormality. Electronically Signed   By: Margaretha Sheffield M.D.   On: 01/21/2022 16:14   CT Abdomen Pelvis W Contrast  Result Date: 01/21/2022 CLINICAL DATA:  Abdominal pain, hypotension, melena, guaiac-positive stool and severe anemia. EXAM: CT ABDOMEN AND PELVIS WITH CONTRAST TECHNIQUE: Multidetector CT imaging of the abdomen and pelvis was performed using the standard protocol following bolus administration of intravenous contrast. RADIATION DOSE REDUCTION: This exam was performed according to the departmental dose-optimization program which includes automated exposure control, adjustment of the mA and/or kV according to patient size and/or use of iterative reconstruction technique. CONTRAST:  28m OMNIPAQUE IOHEXOL 300 MG/ML  SOLN COMPARISON:  Prior CT of the abdomen and pelvis on 10/11/2021 FINDINGS: Lower chest: No acute abnormality. Hepatobiliary: Stable mild biliary dilatation status post prior cholecystectomy. No evidence by CT to suggest choledocholithiasis. The liver is unremarkable. Pancreas: Unremarkable. No pancreatic ductal dilatation or surrounding inflammatory changes. Spleen: Normal in size without focal abnormality. Adrenals/Urinary Tract: Adrenal glands are unremarkable. Kidneys are normal, without renal calculi, focal lesion, or hydronephrosis. Bladder is unremarkable. Stomach/Bowel: The gastric antrum may be mildly thickened. However, the stomach is nondistended and difficult to evaluate. Bowel shows no evidence of obstruction, ileus, inflammation or lesion. No visible ulcers. The appendix is not visualized. Diverticulosis of the sigmoid colon without evidence of acute diverticulitis. No free  intraperitoneal air. Vascular/Lymphatic: Atherosclerosis of the abdominal aorta and iliac arteries without aneurysms. Reproductive: Uterus and bilateral adnexa are unremarkable. Other: No abdominal wall hernia or abnormality. No abdominopelvic ascites. Musculoskeletal: No acute or significant osseous findings. IMPRESSION: 1. Possible mild thickening of the gastric antrum. No overt ulcers are identified by CT. 2. Stable mild biliary dilatation status post prior cholecystectomy. Electronically Signed   By: GAletta EdouardM.D.   On: 01/21/2022 16:04   DG Chest Port 1 View  Result Date: 01/21/2022 CLINICAL DATA:  Sepsis. EXAM: PORTABLE CHEST 1 VIEW COMPARISON:  Chest XR, 10/11/2021.  CT chest, 06/09/2021.  FINDINGS: Cardiomediastinal silhouette is within normal limits. Lungs are hyperinflated with flattening of the diaphragm. No focal consolidation or mass. No pleural effusion or pneumothorax. Cholecystectomy clips. No acute displaced fracture. IMPRESSION: Obstructive lung findings without acute superimposed cardiopulmonary process. Electronically Signed   By: Michaelle Birks M.D.   On: 01/21/2022 15:26     Assessment/Plan Upper GI bleed: Under the circumstances angiography with the hope for embolization is warranted.  The only other alternative would be to move forward with surgical intervention and given the location of the bleed this would be a fairly morbid operation.  The risks of embolization are certainly relatively low but this is a very difficult location to treat.  This was explained to the patient's daughter as the patient has undergone anesthesia today.  Consent was obtained from the daughter and we will move forward with angiography and hopefully embolization.  Risk and benefits were reviewed all questions answered family wishes for [place] to proceed.  2.  Hypotension Due to anemia of blood loss from GI bleed Continue IV fluids keep map above 65 Monitor serial H&H transfuse as indicated per the  hospitalist service   3. GERD Started on IV ppi for above   4. Hyperlipidemia Hold statin (home med)since npo   5. Hypothryoid On synthroid. Continue   [name], MD  01/22/2022 6:13 PM

## 2022-01-22 NOTE — Op Note (Signed)
[facility]  Percutaneous Study/Intervention Procedural Note     Surgeon(s): Mudlogger: none  Pre-operative Diagnosis: 1.  Upper GI bleed 2.  Hemorrhagic shock  Post-operative diagnosis:  Same  Procedure(s) Performed:             1.  Ultrasound guidance for vascular access right femoral artery             2.  Catheter placement into gastroduodenal artery             3.  Aortogram and selective angiogram of the gastroduodenal artery             4.  Microbead embolization of gastroduodenal artery with 1.5 cc of medium 500 to 700 m polyvinyl alcohol beads.             5.  StarClose closure device right femoral artery  Anesthesia: Continuous ECG pulse oximetry and cardiopulmonary monitoring was performed throughout the entire procedure by the interventional radiology nurse total sedation time was 40 minutes.  Parenteral Versed and fentanyl were used.           EBL: 20 cc  Fluoro Time: 6.4 minutes  Contrast: 40 cc              Indications:  Patient is a 81 y.o.female with brisk lower GI bleeding with resultant anemia. The patient has an upper endoscopy showing bleeding from the third and fourth portions of the duodenum. The patient is brought in for angiography for further evaluation and potential treatment. Risks and benefits are discussed and informed consent is obtained  Procedure:  The patient was identified and appropriate procedural time out was performed.  The patient was then placed supine on the table and prepped and draped in the usual sterile fashion. Moderate conscious sedation was administered during a face to face encounter with the patient throughout the procedure with my supervision of the RN administering medicines and monitoring the patient's vital signs, pulse oximetry, telemetry and mental status throughout from the start of the procedure until the patient was taken to the recovery room.  Ultrasound was used to evaluate the  right common femoral artery.  It was patent .  A digital ultrasound image was acquired.  A Seldinger needle was used to access the right common femoral artery under direct ultrasound guidance and a permanent image was performed.  A 0.035 J wire was advanced without resistance and a 5Fr sheath was placed.  Pigtail catheter was placed into the aorta. We transitioned to the lateral projection and abdominal aortogram was obtained.  A V S1 catheter was then reformed in the descending thoracic aorta and used to cannulate the celiac access. A V S1 catheter was used to image the celiac access with injection of contrast.  This demonstrated common hepatic and gastroduodenal arteries.  I was then able to advance a 5 [place] Ansell sheath over the V S1 catheter so that the tip was located just proximal to the origin of the gastroduodenal.  Stiff angled Glidewire was used to select the gastroduodenal and the VS catheter was exchanged for a Kumpe catheter.  Using the Kumpe catheter I created magnified images in the AP and bilateral oblique projections until I was able to identify the branch coursing to all the clips that have been placed by GI.  Based on her continued bleeding and the EGD I elected to treat this area with embolization. I initially advanced the Pro-Great microcatheter out the branch  of the gastroduodenal that clearly fed the area marked by the clips and instilled approximately 1.5 cc of medium 500 700 m sterile PVC beads in this location. Angiogram following this showed the main vessels to be open with less brisk filling indicating successful embolization. II elected to terminate the procedure. The diagnostic catheter was removed. StarClose closure device was deployed in usual fashion with excellent hemostatic result. The patient was taken to the recovery room in stable condition having tolerated the procedure well.     Findings: The abdominal aorta is opacified with contrast it is diffusely calcified but  there are no hemodynamically significant lesions or other large ulcerated areas noted.  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  Once this is crossed the hepatic and gastroduodenal arteries appear normal.  On selective imaging through the microcatheter I was able to opacify the branch of the gastroduodenal artery that appeared to fill the area marked by the endoscopy clips.  Having verified this I instilled 1.5 cc of beads with a significant decrease in distal flow but preservation of the main branch.  This indicates successful embolization.  Disposition: Patient was taken to the recovery room in stable condition having tolerated the procedure well.  Complications:  None  [name] 01/22/2022 6:24 PM   This note was created with Dragon Medical transcription system. Any errors in dictation are purely unintentional.

## 2022-01-22 NOTE — Transfer of Care (Signed)
Immediate Anesthesia Transfer of Care Note  Patient: [name]  Procedure(s) Performed: ESOPHAGOGASTRODUODENOSCOPY (EGD) WITH PROPOFOL  Patient Location: Endoscopy Unit  Anesthesia Type:General  Level of Consciousness: drowsy  Airway & Oxygen Therapy: Patient Spontanous Breathing and Patient connected to face mask oxygen  Post-op Assessment: Report given to RN and Post -op Vital signs reviewed and stable  Post vital signs: Reviewed and stable  Last Vitals:  Vitals Value Taken Time  BP 120/56 01/22/22 1406  Temp    Pulse 79 01/22/22 1406  Resp 23 01/22/22 1406  SpO2 99 % 01/22/22 1406  Vitals shown include unvalidated device data.  Last Pain:  Vitals:   01/22/22 1255  TempSrc: Temporal  PainSc: 1          Complications: No notable events documented.

## 2022-01-22 NOTE — Op Note (Signed)
[facility] Gastroenterology Patient Name: [name] Procedure Date: 01/22/2022 1:13 PM MRN: [id number] Account #: [id number] Date of Birth: [birth year] Admit Type: Inpatient Age: 81 Room: [facility] ENDO ROOM 1 Gender: Female Note Status: Finalized Instrument Name: Upper Endoscope [id number] Procedure:             Upper GI endoscopy Indications:           Melena Providers:             Lucilla Lame MD, MD Referring MD:          Rusty Aus, MD (Referring MD) Medicines:             Propofol per Anesthesia Complications:         No immediate complications. Procedure:             Pre-Anesthesia Assessment:                        - Prior to the procedure, a History and Physical was                         performed, and patient medications and allergies were                         reviewed. The patient's tolerance of previous                         anesthesia was also reviewed. The risks and benefits                         of the procedure and the sedation options and risks                         were discussed with the patient. All questions were                         answered, and informed consent was obtained. Prior                         Anticoagulants: The patient has taken no previous                         anticoagulant or antiplatelet agents. ASA Grade                         Assessment: II - A patient with mild systemic disease.                         After reviewing the risks and benefits, the patient                         was deemed in satisfactory condition to undergo the                         procedure.                        After obtaining informed consent, the endoscope was  passed under direct vision. Throughout the procedure,                         the patient's blood pressure, pulse, and oxygen                         saturations were monitored continuously. The Endoscope                         was introduced  through the mouth, and advanced to the                         third part of duodenum. The upper GI endoscopy was                         accomplished without difficulty. The patient tolerated                         the procedure well. Findings:      The examined esophagus was normal.      The stomach was normal.      Red blood was found in the third portion of the duodenum.      Coagulation for hemostasis using argon plasma at 2 liters/minute and 30       watts was unsuccessful in the third portion of the duodenum. For       hemostasis, three hemostatic clips were successfully placed (MR       conditional) in the third portion of the duodenum. Decreased but not       cessation of bleeding. Impression:            - Normal esophagus.                        - Normal stomach.                        - Blood in the third portion of the duodenum.                        - Hemostasis in the third portion of the duodenum with                         argon plasma coagulation (APC) was unsuccessful.                        - Three hemostatic clips were successfully placed (MR                         conditional) in the third portion of the duodenum.                        - No specimens collected. Recommendation:        - Return patient to hospital ward for ongoing care.                        - NPO.                        - Refer to vascular surgery. Procedure Code(s):     ---  Professional ---                        [phone], Esophagogastroduodenoscopy, flexible,                         transoral; with control of bleeding, any method Diagnosis Code(s):     --- Professional ---                        K92.1, Melena (includes Hematochezia) CPT copyright 2019 American Medical Association. All rights reserved. The codes documented in this report are preliminary and upon coder review may  be revised to meet current compliance requirements. Lucilla Lame MD, MD 01/22/2022 2:11:03 PM This report has been  signed electronically. Number of Addenda: 0 Note Initiated On: 01/22/2022 1:13 PM Estimated Blood Loss:  Estimated blood loss: none. Estimated blood loss: 15                         mL.      [facility]

## 2022-01-22 NOTE — Anesthesia Procedure Notes (Signed)
Procedure Name: MAC Date/Time: 01/22/2022 1:22 PM  Performed by: Loletha Grayer, CRNAPre-anesthesia Checklist: Emergency Drugs available, Patient identified, Suction available, Timeout performed and Patient being monitored Patient Re-evaluated:Patient Re-evaluated prior to induction Oxygen Delivery Method: Simple face mask

## 2022-01-23 DIAGNOSIS — K922 Gastrointestinal hemorrhage, unspecified: Secondary | ICD-10-CM | POA: Diagnosis not present

## 2022-01-23 DIAGNOSIS — J449 Chronic obstructive pulmonary disease, unspecified: Secondary | ICD-10-CM | POA: Diagnosis not present

## 2022-01-23 DIAGNOSIS — K2981 Duodenitis with bleeding: Secondary | ICD-10-CM

## 2022-01-23 DIAGNOSIS — K219 Gastro-esophageal reflux disease without esophagitis: Secondary | ICD-10-CM | POA: Diagnosis not present

## 2022-01-23 LAB — PREPARE FRESH FROZEN PLASMA: Unit division: 0

## 2022-01-23 LAB — BPAM FFP
Blood Product Expiration Date: 202308222359
Blood Product Expiration Date: 202308222359
ISSUE DATE / TIME: 202308171754
ISSUE DATE / TIME: 202308180255
Unit Type and Rh: 5100
Unit Type and Rh: 5100

## 2022-01-23 LAB — URINE CULTURE: Culture: NO GROWTH

## 2022-01-23 LAB — HEMOGLOBIN AND HEMATOCRIT, BLOOD
HCT: 26.7 % — ABNORMAL LOW (ref 36.0–46.0)
Hemoglobin: 8.9 g/dL — ABNORMAL LOW (ref 12.0–15.0)

## 2022-01-23 MED ORDER — ATORVASTATIN CALCIUM 20 MG PO TABS
10.0000 mg | ORAL_TABLET | Freq: Every day | ORAL | Status: DC
  Administered 2022-01-23 – 2022-01-27 (×5): 10 mg via ORAL
  Filled 2022-01-23 (×5): qty 1

## 2022-01-23 MED ORDER — METOPROLOL TARTRATE 25 MG PO TABS
12.5000 mg | ORAL_TABLET | Freq: Two times a day (BID) | ORAL | Status: DC
  Administered 2022-01-23 – 2022-01-24 (×3): 12.5 mg via ORAL
  Filled 2022-01-23 (×3): qty 1

## 2022-01-23 NOTE — Progress Notes (Signed)
Patient admitted to the unit and placed on telemetry monitor. A skin assessment was completed by myself and Hydrologist. The patient has a large bruise on her right arm and scattered bruising on the right arm. Their are no other skin issues noted at this time.

## 2022-01-23 NOTE — Progress Notes (Signed)
PROGRESS NOTE    [name]  JJK:[id number] DOB: [birth year] DOA: 01/21/2022 PCP: Rusty Aus, MD    Brief Narrative:  [name] is a 81 y.o. female with medical history significant of rheumatoid arthritis, COPD, GERD, hypothyroidism, ongoing tobacco abuse presents with complaints of weakness, found hypotensive, and Hg 4.5.s/p 2 units prbc. GI consulted.  8/19 status post EGD and embolization of gastroduodenal artery by vascular on 6 8/18  Consultants:  Gi, vascular surgery, general surgery  Procedures:   Antimicrobials:      Subjective: With mild abd pain, asking for food. No n/v  Objective: Vitals:   01/23/22 0302 01/23/22 0303 01/23/22 0851 01/23/22 1122  BP:  131/68 (!) 147/98 (!) 152/90  Pulse: 91 (!) 108 87 91  Resp:  '16 18 17  '$ Temp:  99.1 F (37.3 C) 98.1 F (36.7 C) 98.6 F (37 C)  TempSrc:   Oral Oral  SpO2: 96% 96% 95% 97%  Weight:  38.1 kg    Height:  '5\' 4"'$  (1.626 m)      Intake/Output Summary (Last 24 hours) at 01/23/2022 1229 Last data filed at 01/23/2022 1126 Gross per 24 hour  Intake 1293.46 ml  Output 1950 ml  Net -656.54 ml   Filed Weights   01/21/22 1440 01/21/22 1900 01/23/22 0303  Weight: 52.2 kg 41.6 kg 38.1 kg    Examination: Calm, NAD Cta no w/r Reg s1/s2 no gallop Soft benign +bs No edema Awakens, but sleepy.  Mood and affect appropriate in current setting     Data Reviewed: I have personally reviewed following labs and imaging studies  CBC: Recent Labs  Lab 01/21/22 1455 01/22/22 0201 01/22/22 1552 01/23/22 1055  WBC 8.6 9.4  --   --   NEUTROABS 6.6  --   --   --   HGB 4.5* 7.8* 7.4* 8.9*  HCT 14.2* 23.4* 22.4* 26.7*  MCV 99.3 90.7  --   --   PLT 203 208  --   --    Basic Metabolic Panel: Recent Labs  Lab 01/21/22 1455  NA 135  K 3.6  CL 106  CO2 24  GLUCOSE 93  BUN 39*  CREATININE 0.80  CALCIUM 7.5*   GFR: Estimated Creatinine Clearance: 33.7 mL/min (by C-G formula based on SCr of  0.8 mg/dL). Liver Function Tests: Recent Labs  Lab 01/21/22 1455  AST 19  ALT 13  ALKPHOS 41  BILITOT 0.5  PROT 4.8*  ALBUMIN 2.5*   No results for input(s): "LIPASE", "AMYLASE" in the last 168 hours. No results for input(s): "AMMONIA" in the last 168 hours. Coagulation Profile: Recent Labs  Lab 01/21/22 1455  INR 1.4*   Cardiac Enzymes: No results for input(s): "CKTOTAL", "CKMB", "CKMBINDEX", "TROPONINI" in the last 168 hours. BNP (last 3 results) No results for input(s): "PROBNP" in the last 8760 hours. HbA1C: No results for input(s): "HGBA1C" in the last 72 hours. CBG: Recent Labs  Lab 01/21/22 1900  GLUCAP 82   Lipid Profile: No results for input(s): "CHOL", "HDL", "LDLCALC", "TRIG", "CHOLHDL", "LDLDIRECT" in the last 72 hours. Thyroid Function Tests: No results for input(s): "TSH", "T4TOTAL", "FREET4", "T3FREE", "THYROIDAB" in the last 72 hours. Anemia Panel: No results for input(s): "VITAMINB12", "FOLATE", "FERRITIN", "TIBC", "IRON", "RETICCTPCT" in the last 72 hours. Sepsis Labs: Recent Labs  Lab 01/21/22 1517 01/21/22 1735  LATICACIDVEN 3.4* 1.1    Recent Results (from the past 240 hour(s))  Blood Culture (routine x 2)     Status:  None (Preliminary result)   Collection Time: 01/21/22  3:17 PM   Specimen: BLOOD  Result Value Ref Range Status   Specimen Description BLOOD BLOOD LEFT FOREARM  Final   Special Requests   Final    BOTTLES DRAWN AEROBIC AND ANAEROBIC Blood Culture adequate volume   Culture   Final    NO GROWTH 2 DAYS Performed at [facility], [address] [place], [place] [address]    Report Status PENDING  Incomplete  Blood Culture (routine x 2)     Status: None (Preliminary result)   Collection Time: 01/21/22  3:17 PM   Specimen: BLOOD  Result Value Ref Range Status   Specimen Description BLOOD BLOOD LEFT HAND  Final   Special Requests   Final    BOTTLES DRAWN AEROBIC AND ANAEROBIC Blood Culture results may not be  optimal due to an excessive volume of blood received in culture bottles   Culture   Final    NO GROWTH 2 DAYS Performed at [facility], [address] [place], [place] [address]    Report Status PENDING  Incomplete  MRSA Next Gen by PCR, Nasal     Status: None   Collection Time: 01/21/22  8:26 PM   Specimen: Nasal Mucosa; Nasal Swab  Result Value Ref Range Status   MRSA by PCR Next Gen NOT DETECTED NOT DETECTED Final    Comment: (NOTE) The GeneXpert MRSA Assay (FDA approved for NASAL specimens only), is one component of a comprehensive MRSA colonization surveillance program. It is not intended to diagnose MRSA infection nor to guide or monitor treatment for MRSA infections. Test performance is not FDA approved in patients less than 41 years old. Performed at [facility], [address] [place], [place] [address]   Urine Culture     Status: None   Collection Time: 01/22/22  6:01 AM   Specimen: Urine, Random  Result Value Ref Range Status   Specimen Description   Final    URINE, RANDOM Performed at [facility], [address] [place], [place] [address]    Special Requests   Final    NONE Performed at [facility], [address] [place], [place] [address]    Culture   Final    NO GROWTH Performed at [facility], [place] [address] [place], [place] [address]    Report Status 01/23/2022 FINAL  Final         Radiology Studies: PERIPHERAL VASCULAR CATHETERIZATION  Result Date: 01/22/2022 See surgical note for result.  CT Head Wo Contrast  Result Date: 01/21/2022 CLINICAL DATA:  Mental status change, unknown cause EXAM: CT HEAD WITHOUT CONTRAST TECHNIQUE: Contiguous axial images were obtained from the base of the skull through the vertex without intravenous contrast. RADIATION DOSE REDUCTION: This exam was performed according to the departmental dose-optimization program which includes automated exposure control, adjustment  of the mA and/or kV according to patient size and/or use of iterative reconstruction technique. COMPARISON:  CT head May 7, 23. FINDINGS: Brain: No evidence of acute large vascular territory infarction, hemorrhage, hydrocephalus, extra-axial collection or mass lesion/mass effect. Patchy white matter hypodensities, nonspecific but compatible with chronic microvascular ischemic disease. Vascular: No hyperdense vessel identified. Skull: No acute fracture. Sinuses/Orbits: Largely clear sinuses.  No acute orbital findings. Other: No mastoid effusions. IMPRESSION: No evidence of acute intracranial abnormality. Electronically Signed   By: Margaretha Sheffield M.D.   On: 01/21/2022 16:14   CT Abdomen Pelvis W Contrast  Result Date: 01/21/2022 CLINICAL DATA:  Abdominal pain, hypotension, melena, guaiac-positive stool and severe anemia. EXAM: CT ABDOMEN AND PELVIS WITH CONTRAST TECHNIQUE: Multidetector CT imaging of the abdomen and pelvis was performed using the standard protocol following bolus administration of intravenous contrast. RADIATION DOSE REDUCTION: This exam was performed according to the departmental dose-optimization program which includes automated exposure control, adjustment of the mA and/or kV according to patient size and/or use of iterative reconstruction technique. CONTRAST:  29m OMNIPAQUE IOHEXOL 300 MG/ML  SOLN COMPARISON:  Prior CT of the abdomen and pelvis on 10/11/2021 FINDINGS: Lower chest: No acute abnormality. Hepatobiliary: Stable mild biliary dilatation status post prior cholecystectomy. No evidence by CT to suggest choledocholithiasis. The liver is unremarkable. Pancreas: Unremarkable. No pancreatic ductal dilatation or surrounding inflammatory changes. Spleen: Normal in size without focal abnormality. Adrenals/Urinary Tract: Adrenal glands are unremarkable. Kidneys are normal, without renal calculi, focal lesion, or hydronephrosis. Bladder is unremarkable. Stomach/Bowel: The gastric antrum  may be mildly thickened. However, the stomach is nondistended and difficult to evaluate. Bowel shows no evidence of obstruction, ileus, inflammation or lesion. No visible ulcers. The appendix is not visualized. Diverticulosis of the sigmoid colon without evidence of acute diverticulitis. No free intraperitoneal air. Vascular/Lymphatic: Atherosclerosis of the abdominal aorta and iliac arteries without aneurysms. Reproductive: Uterus and bilateral adnexa are unremarkable. Other: No abdominal wall hernia or abnormality. No abdominopelvic ascites. Musculoskeletal: No acute or significant osseous findings. IMPRESSION: 1. Possible mild thickening of the gastric antrum. No overt ulcers are identified by CT. 2. Stable mild biliary dilatation status post prior cholecystectomy. Electronically Signed   By: GAletta EdouardM.D.   On: 01/21/2022 16:04   DG Chest Port 1 View  Result Date: 01/21/2022 CLINICAL DATA:  Sepsis. EXAM: PORTABLE CHEST 1 VIEW COMPARISON:  Chest XR, 10/11/2021.  CT chest, 06/09/2021. FINDINGS: Cardiomediastinal silhouette is within normal limits. Lungs are hyperinflated with flattening of the diaphragm. No focal consolidation or mass. No pleural effusion or pneumothorax. Cholecystectomy clips. No acute displaced fracture. IMPRESSION: Obstructive lung findings without acute superimposed cardiopulmonary process. Electronically Signed   By: JMichaelle BirksM.D.   On: 01/21/2022 15:26        Scheduled Meds:  Chlorhexidine Gluconate Cloth  6 each Topical Daily   levothyroxine  50 mcg Oral Daily   lidocaine  1 patch Transdermal Q24H   nicotine  14 mg Transdermal Daily   pantoprazole (PROTONIX) IV  40 mg Intravenous Q12H   sodium chloride flush  3 mL Intravenous Q12H   Continuous Infusions:  sodium chloride     lactated ringers 125 mL/hr at 01/23/22 1137    Assessment & Plan:   Principal Problem:   GIB (gastrointestinal bleeding) Active Problems:   RA (rheumatoid arthritis) (HCC)    Hyperlipidemia   GERD (gastroesophageal reflux disease)   Tobacco abuse   Hypothyroid   COPD (chronic obstructive pulmonary disease) (HCC)   Hypotension   Upper GI bleed   Acute GIB Patient found with hypotension and dark-colored stool On NSAIDS and ASA, likely culprit Hemoglobin 4.5, was 10.6 on 12/23/2021 . Bun elevated. Cta/p as above Given zosyn and IVF Will continue zosyn for now GI consulted by ED Npo for now Continue IV fluids for resuscitation Will admit inpatient to stepdown unit Telemetry 2 units of packed red blood cells have been ordered by ED, may need more.  We will check H&H after second unit IV PPI twice daily Per ER they spoke to GI Dr. WAllen Norrisand he recommended surgery to get  involved.  Also recommended FFP, which is ordered by ED Transfuse to keep Hg around 8 8/18 H&H stable  status post EGD found with active small bowel bleed.  Had clips but not completely stopped.  GI recommended vascular surgery for embolization near the clips.  Vascular surgery consulted Dr. Delana Meyer is made aware 8/19 arterial embolization by vascular on 8/18 H/h stable Will start clears   2.  Hypotension Due to hypovolemia from GI bleed 8/19 improved, will decrease ivf to 38m/hr   3. GERD Continie iv ppi   4. Hyperlipidemia Can resume statin   5. Hypothryoid On synthroid.      6.COPD On RA. No acute exacerbation   7. Tobacco abuse Patient continues to smoke.  Counseled patient extensively on smoking cessation Nicotine patch   8.RA On methotrexate weekly.    DVT prophylaxis: SCD Code Status: DNR Family Communication: Brother at bedside Disposition Plan:  Status is: Inpatient Remains inpatient appropriate because: IV treatment. Need clearance from vascular and Gi for discharge        LOS: 2 days   Time spent: 35 min    SNolberto Hanlon MD [facility] Pager [phone]  If 7PM-7AM, please contact night-coverage 01/23/2022, 12:29 PM

## 2022-01-23 NOTE — Progress Notes (Signed)
Subjective:  CC: [name] is a 81 y.o. female  Hospital stay day 2, 1 Day Post-Op gastroduodenal artery embolization by vascular surgery for GI bleed.  HPI: No acute issues overnight.  Patient states she is comfortable, does not report any bowel movements.  ROS:  General: Denies weight loss, weight gain, fatigue, fevers, chills, and night sweats. Heart: Denies chest pain, palpitations, racing heart, irregular heartbeat, leg pain or swelling, and decreased activity tolerance. Respiratory: Denies breathing difficulty, shortness of breath, wheezing, cough, and sputum. GI: Denies change in appetite, heartburn, nausea, vomiting, constipation, diarrhea, and blood in stool. GU: Denies difficulty urinating, pain with urinating, urgency, frequency, blood in urine.   Objective:   Temp:  [97.6 F (36.4 C)-99.1 F (37.3 C)] 98.1 F (36.7 C) (08/19 0851) Pulse Rate:  [0-108] 87 (08/19 0851) Resp:  [14-27] 18 (08/19 0851) BP: (102-147)/(54-98) 147/98 (08/19 0851) SpO2:  [91 %-100 %] 95 % (08/19 0851) Weight:  [38.1 kg] 38.1 kg (08/19 0303)     Height: '5\' 4"'$  (162.6 cm) Weight: 38.1 kg (stated by pt) BMI (Calculated): 14.41   Intake/Output this shift:   Intake/Output Summary (Last 24 hours) at 01/23/2022 1031 Last data filed at 01/23/2022 6384 Gross per 24 hour  Intake 1293.46 ml  Output 1350 ml  Net -56.54 ml    Constitutional :  alert, cooperative, appears stated age, and no distress  Respiratory:  clear to auscultation bilaterally  Cardiovascular:  regular rate and rhythm  Gastrointestinal: soft, non-tender; bowel sounds normal; no masses,  no organomegaly.   Skin: Cool and moist.   Psychiatric: Normal affect, non-agitated, not confused       LABS:     Latest Ref Rng & Units 01/21/2022    2:55 PM 10/12/2021    5:25 AM 10/11/2021   10:55 PM  CMP  Glucose 70 - 99 mg/dL 93  100    BUN 8 - 23 mg/dL 39  23    Creatinine 0.44 - 1.00 mg/dL 0.80  0.96    Sodium 135 - 145 mmol/L  135  137    Potassium 3.5 - 5.1 mmol/L 3.6  3.6    Chloride 98 - 111 mmol/L 106  105    CO2 22 - 32 mmol/L 24  26    Calcium 8.9 - 10.3 mg/dL 7.5  8.1    Total Protein 6.5 - 8.1 g/dL 4.8   5.9   Total Bilirubin 0.3 - 1.2 mg/dL 0.5   0.7   Alkaline Phos 38 - 126 U/L 41   59   AST 15 - 41 U/L 19   25   ALT 0 - 44 U/L 13   17       Latest Ref Rng & Units 01/22/2022    3:52 PM 01/22/2022    2:01 AM 01/21/2022    2:55 PM  CBC  WBC 4.0 - 10.5 K/uL  9.4  8.6   Hemoglobin 12.0 - 15.0 g/dL 7.4  7.8  4.5   Hematocrit 36.0 - 46.0 % 22.4  23.4  14.2   Platelets 150 - 400 K/uL  208  203     RADS: N/a Assessment:   S/p gastroduodenal artery embolization by vascular surgery for GI bleed.  No report of additional bowel movements.  Patient overall remains comfortable.  Will defer resuming diet to vascular surgery at this point.  General surgery will peripherally follow for now.  Call with any additional questions or concerns  labs/images/medications/previous chart entries reviewed personally  and relevant changes/updates noted above.

## 2022-01-24 DIAGNOSIS — J449 Chronic obstructive pulmonary disease, unspecified: Secondary | ICD-10-CM | POA: Diagnosis not present

## 2022-01-24 DIAGNOSIS — K219 Gastro-esophageal reflux disease without esophagitis: Secondary | ICD-10-CM | POA: Diagnosis not present

## 2022-01-24 DIAGNOSIS — K922 Gastrointestinal hemorrhage, unspecified: Secondary | ICD-10-CM | POA: Diagnosis not present

## 2022-01-24 DIAGNOSIS — K2981 Duodenitis with bleeding: Secondary | ICD-10-CM | POA: Diagnosis not present

## 2022-01-24 LAB — CBC
HCT: 27.3 % — ABNORMAL LOW (ref 36.0–46.0)
Hemoglobin: 9.1 g/dL — ABNORMAL LOW (ref 12.0–15.0)
MCH: 31.2 pg (ref 26.0–34.0)
MCHC: 33.3 g/dL (ref 30.0–36.0)
MCV: 93.5 fL (ref 80.0–100.0)
Platelets: 297 10*3/uL (ref 150–400)
RBC: 2.92 MIL/uL — ABNORMAL LOW (ref 3.87–5.11)
RDW: 16.8 % — ABNORMAL HIGH (ref 11.5–15.5)
WBC: 12.3 10*3/uL — ABNORMAL HIGH (ref 4.0–10.5)
nRBC: 0 % (ref 0.0–0.2)

## 2022-01-24 NOTE — Progress Notes (Signed)
PROGRESS NOTE    [name]  QQV:[id number] DOB: [birth year] DOA: 01/21/2022 PCP: Rusty Aus, MD    Brief Narrative:  [name] is a 81 y.o. female with medical history significant of rheumatoid arthritis, COPD, GERD, hypothyroidism, ongoing tobacco abuse presents with complaints of weakness, found hypotensive, and Hg 4.5.s/p 2 units prbc. GI consulted.  8/19 status post EGD and embolization of gastroduodenal artery by vascular on 6 8/18 8/20 no complaints or issues, Hg stable.  Consultants:  Gi, vascular surgery, general surgery  Procedures:   Antimicrobials:      Subjective: No abdominal pain this AM.  No nausea or vomiting  Objective: Vitals:   01/24/22 0016 01/24/22 0400 01/24/22 0738 01/24/22 1135  BP: 120/78 116/71 128/76 (!) 97/55  Pulse: (!) 102 81 (!) 45 73  Resp: '16 16 14 16  '$ Temp: 100.1 F (37.8 C) 99.4 F (37.4 C) 98.1 F (36.7 C) 98.2 F (36.8 C)  TempSrc:    Oral  SpO2: 100% 96% 97% 99%  Weight:      Height:        Intake/Output Summary (Last 24 hours) at 01/24/2022 1201 Last data filed at 01/24/2022 1100 Gross per 24 hour  Intake 203 ml  Output 1700 ml  Net -1497 ml   Filed Weights   01/21/22 1440 01/21/22 1900 01/23/22 0303  Weight: 52.2 kg 41.6 kg 38.1 kg    Examination: Calm, NAD Cta no w/r Reg s1/s2 no gallop Soft benign +bs No edema Grossly intact Mood and affect appropriate in current setting     Data Reviewed: I have personally reviewed following labs and imaging studies  CBC: Recent Labs  Lab 01/21/22 1455 01/22/22 0201 01/22/22 1552 01/23/22 1055 01/24/22 0910  WBC 8.6 9.4  --   --  12.3*  NEUTROABS 6.6  --   --   --   --   HGB 4.5* 7.8* 7.4* 8.9* 9.1*  HCT 14.2* 23.4* 22.4* 26.7* 27.3*  MCV 99.3 90.7  --   --  93.5  PLT 203 208  --   --  332   Basic Metabolic Panel: Recent Labs  Lab 01/21/22 1455  NA 135  K 3.6  CL 106  CO2 24  GLUCOSE 93  BUN 39*  CREATININE 0.80  CALCIUM 7.5*    GFR: Estimated Creatinine Clearance: 33.7 mL/min (by C-G formula based on SCr of 0.8 mg/dL). Liver Function Tests: Recent Labs  Lab 01/21/22 1455  AST 19  ALT 13  ALKPHOS 41  BILITOT 0.5  PROT 4.8*  ALBUMIN 2.5*   No results for input(s): "LIPASE", "AMYLASE" in the last 168 hours. No results for input(s): "AMMONIA" in the last 168 hours. Coagulation Profile: Recent Labs  Lab 01/21/22 1455  INR 1.4*   Cardiac Enzymes: No results for input(s): "CKTOTAL", "CKMB", "CKMBINDEX", "TROPONINI" in the last 168 hours. BNP (last 3 results) No results for input(s): "PROBNP" in the last 8760 hours. HbA1C: No results for input(s): "HGBA1C" in the last 72 hours. CBG: Recent Labs  Lab 01/21/22 1900  GLUCAP 82   Lipid Profile: No results for input(s): "CHOL", "HDL", "LDLCALC", "TRIG", "CHOLHDL", "LDLDIRECT" in the last 72 hours. Thyroid Function Tests: No results for input(s): "TSH", "T4TOTAL", "FREET4", "T3FREE", "THYROIDAB" in the last 72 hours. Anemia Panel: No results for input(s): "VITAMINB12", "FOLATE", "FERRITIN", "TIBC", "IRON", "RETICCTPCT" in the last 72 hours. Sepsis Labs: Recent Labs  Lab 01/21/22 1517 01/21/22 1735  LATICACIDVEN 3.4* 1.1    Recent Results (from  the past 240 hour(s))  Blood Culture (routine x 2)     Status: None (Preliminary result)   Collection Time: 01/21/22  3:17 PM   Specimen: BLOOD  Result Value Ref Range Status   Specimen Description BLOOD BLOOD LEFT FOREARM  Final   Special Requests   Final    BOTTLES DRAWN AEROBIC AND ANAEROBIC Blood Culture adequate volume   Culture   Final    NO GROWTH 3 DAYS Performed at [facility], [address] [place], [place] [address]    Report Status PENDING  Incomplete  Blood Culture (routine x 2)     Status: None (Preliminary result)   Collection Time: 01/21/22  3:17 PM   Specimen: BLOOD  Result Value Ref Range Status   Specimen Description BLOOD BLOOD LEFT HAND  Final   Special Requests    Final    BOTTLES DRAWN AEROBIC AND ANAEROBIC Blood Culture results may not be optimal due to an excessive volume of blood received in culture bottles   Culture   Final    NO GROWTH 3 DAYS Performed at [facility], [address] [place], [place] [address]    Report Status PENDING  Incomplete  MRSA Next Gen by PCR, Nasal     Status: None   Collection Time: 01/21/22  8:26 PM   Specimen: Nasal Mucosa; Nasal Swab  Result Value Ref Range Status   MRSA by PCR Next Gen NOT DETECTED NOT DETECTED Final    Comment: (NOTE) The GeneXpert MRSA Assay (FDA approved for NASAL specimens only), is one component of a comprehensive MRSA colonization surveillance program. It is not intended to diagnose MRSA infection nor to guide or monitor treatment for MRSA infections. Test performance is not FDA approved in patients less than 1 years old. Performed at [facility], [address] [place], [place] [address]   Urine Culture     Status: None   Collection Time: 01/22/22  6:01 AM   Specimen: Urine, Random  Result Value Ref Range Status   Specimen Description   Final    URINE, RANDOM Performed at [facility], [address], [place], [place] [address]    Special Requests   Final    NONE Performed at [facility], [address] [place], [place] [address]    Culture   Final    NO GROWTH Performed at [facility], [place] [address] [place], [place] [address]    Report Status 01/23/2022 FINAL  Final         Radiology Studies: PERIPHERAL VASCULAR CATHETERIZATION  Result Date: 01/22/2022 See surgical note for result.       Scheduled Meds:  atorvastatin  10 mg Oral Daily   Chlorhexidine Gluconate Cloth  6 each Topical Daily   levothyroxine  50 mcg Oral Daily   lidocaine  1 patch Transdermal Q24H   nicotine  14 mg Transdermal Daily   pantoprazole (PROTONIX) IV  40 mg Intravenous Q12H   sodium chloride flush  3 mL Intravenous Q12H    Continuous Infusions:  sodium chloride      Assessment & Plan:   Principal Problem:   GIB (gastrointestinal bleeding) Active Problems:   RA (rheumatoid arthritis) (HCC)   Hyperlipidemia   GERD (gastroesophageal reflux disease)   Tobacco abuse   Hypothyroid   COPD (chronic obstructive pulmonary disease) (HCC)   Hypotension   Upper GI bleed   Acute GIB Patient found with hypotension and dark-colored stool On NSAIDS  and ASA, likely culprit Hemoglobin 4.5, was 10.6 on 12/23/2021 . Bun elevated. Cta/p as above Given zosyn and IVF Will continue zosyn for now GI consulted by ED Npo for now Continue IV fluids for resuscitation Will admit inpatient to stepdown unit Telemetry 2 units of packed red blood cells have been ordered by ED, may need more.  We will check H&H after second unit IV PPI twice daily Per ER they spoke to GI Dr. Allen Norris and he recommended surgery to get involved.  Also recommended FFP, which is ordered by ED Transfuse to keep Hg around 8 8/18 H&H stable  status post EGD found with active small bowel bleed.  Had clips but not completely stopped.  GI recommended vascular surgery for embolization near the clips.  Vascular surgery consulted Dr. Delana Meyer is made aware 8/19 arterial embolization by vascular on 8/18 8/20 H&H stable Advance diet  2.  Hypotension Due to hypovolemia from GI bleed 8/20 improved.  Was started on metoprolol for sinus tachycardia however will DC since BP on lower side    3. GERD Continue PPI IV and can switch to p.o. on discharge   4. Hyperlipidemia Statins   5. Hypothryoid On synthroid.      6.COPD On RA. No acute exacerbation   7. Tobacco abuse Patient continues to smoke.  Counseled patient extensively on smoking cessation Nicotine patch   8.RA On methotrexate weekly.    DVT prophylaxis: SCD Code Status: DNR Family Communication: none at bedside Disposition Plan:  Status is: Inpatient Remains inpatient appropriate  because: IV treatment. PT consulted.        LOS: 3 days   Time spent: 35 min    Nolberto Hanlon, MD [facility] Pager [phone]  If 7PM-7AM, please contact night-coverage 01/24/2022, 12:01 PM

## 2022-01-24 NOTE — Evaluation (Signed)
Physical Therapy Evaluation Patient Details Name: [name] MRN: [id number] DOB: Feb 14, 1941 Today's Date: 01/24/2022  History of Present Illness  Per MD: Dub Amis Merle is a 81 y.o. female with past medical history of COPD, hemorrhoids, GERD, anemia, chronic pain who presents with generalized weakness and hypotension found by EMS.    Clinical Impression  Pt received in Semi-Fowler's position and agreeable to therapy.  Pt performs transfers well and was able to come into standing with just supervision.  Pt does require use of the RW to ambulate and takes shortened strides with all mobility and has a very narrow BOS.  Pt cannot correct this with verbal cuing, noting that she feels as though she will fall if she changes her gait.  Pt also tends to look down, but with consistent verbal cues, is able to adjust and look up while ambulating.  Pt agreed that she is not ready to go home at this time due to lack of caregiver support at the moment.  Pt did not want to be placed in SNF however, but pt would be much safer going to SNF at this time rather than home.         Recommendations for follow up therapy are one component of a multi-disciplinary discharge planning process, led by the attending physician.  Recommendations may be updated based on patient status, additional functional criteria and insurance authorization.  Follow Up Recommendations Skilled nursing-short term rehab (<3 hours/day) Can patient physically be transported by private vehicle: No    Assistance Recommended at Discharge Frequent or constant Supervision/Assistance  Patient can return home with the following  A little help with walking and/or transfers;A little help with bathing/dressing/bathroom;Assist for transportation;Help with stairs or ramp for entrance    Equipment Recommendations None recommended by PT  Recommendations for Other Services       Functional Status Assessment Patient has had a recent decline  in their functional status and demonstrates the ability to make significant improvements in function in a reasonable and predictable amount of time.     Precautions / Restrictions Restrictions Weight Bearing Restrictions: No      Mobility  Bed Mobility Overal bed mobility: Modified Independent             General bed mobility comments: use of bed features, but modI    Transfers Overall transfer level: Needs assistance Equipment used: Rolling walker (2 wheels) Transfers: Sit to/from Stand Sit to Stand: Min guard                Ambulation/Gait Ambulation/Gait assistance: Min guard Gait Distance (Feet): 140 Feet Assistive device: Rolling walker (2 wheels) Gait Pattern/deviations: Step-through pattern, Decreased step length - right, Decreased step length - left, Decreased stride length, Steppage, Shuffle, Narrow base of support Gait velocity: significantly decreased.     General Gait Details: Pt with extremely small steps and tends to look down at feet rather than up.  Very narrow BOS as well.  Stairs            Wheelchair Mobility    Modified Rankin (Stroke Patients Only)       Balance Overall balance assessment: Needs assistance Sitting-balance support: No upper extremity supported Sitting balance-Leahy Scale: Fair     Standing balance support: Bilateral upper extremity supported, During functional activity, Reliant on assistive device for balance Standing balance-Leahy Scale: Fair  Pertinent Vitals/Pain Pain Assessment Pain Assessment: 0-10 Pain Score: 5  Pain Location: Neck, L Foot, R Arm, and L Hand Pain Descriptors / Indicators: Aching Pain Intervention(s): Limited activity within patient's tolerance, Monitored during session    Home Living Family/patient expects to be discharged to:: Private residence Living Arrangements: Alone   Type of Home: House Home Access: Stairs to enter Entrance  Stairs-Rails: Right Entrance Stairs-Number of Steps: 4 in front. 3 in back   Home Layout: One level Home Equipment: Shower seat - built Medical sales representative (2 wheels);Grab bars - tub/shower      Prior Function Prior Level of Function : Independent/Modified Independent;Other (comment) (Pt notes she does everything on her own unless her RA flares up really bad.)                     Hand Dominance   Dominant Hand: Right    Extremity/Trunk Assessment   Upper Extremity Assessment Upper Extremity Assessment: Generalized weakness    Lower Extremity Assessment Lower Extremity Assessment: Generalized weakness       Communication   Communication: No difficulties  Cognition Arousal/Alertness: Awake/alert Behavior During Therapy: WFL for tasks assessed/performed Overall Cognitive Status: Within Functional Limits for tasks assessed                                 General Comments: somewhat HOH.        General Comments      Exercises     Assessment/Plan    PT Assessment Patient needs continued PT services  PT Problem List Decreased strength;Decreased activity tolerance;Decreased balance;Decreased mobility;Decreased safety awareness       PT Treatment Interventions DME instruction;Gait training;Stair training    PT Goals (Current goals can be found in the Care Plan section)  Acute Rehab PT Goals Patient Stated Goal: to get stronger. PT Goal Formulation: With patient Time For Goal Achievement: 02/07/22 Potential to Achieve Goals: Fair    Frequency Min 2X/week     Co-evaluation               AM-PAC PT "6 Clicks" Mobility  Outcome Measure Help needed turning from your back to your side while in a flat bed without using bedrails?: A Little Help needed moving from lying on your back to sitting on the side of a flat bed without using bedrails?: A Little Help needed moving to and from a bed to a chair (including a wheelchair)?: A Little Help  needed standing up from a chair using your arms (e.g., wheelchair or bedside chair)?: A Little Help needed to walk in hospital room?: A Little Help needed climbing 3-5 steps with a railing? : A Lot 6 Click Score: 17    End of Session Equipment Utilized During Treatment: Gait belt Activity Tolerance: Patient tolerated treatment well Patient left: in bed;with call bell/phone within reach;with bed alarm set Nurse Communication: Mobility status PT Visit Diagnosis: Unsteadiness on feet (R26.81);Other abnormalities of gait and mobility (R26.89);Muscle weakness (generalized) (M62.81);History of falling (Z91.81);Difficulty in walking, not elsewhere classified (R26.2)    Time: 1536-1600 PT Time Calculation (min) (ACUTE ONLY): 24 min   Charges:   PT Evaluation $PT Eval Low Complexity: 1 Low PT Treatments $Gait Training: 8-22 mins        Gwenlyn Saran, PT, DPT 01/24/22, 5:44 PM

## 2022-01-25 ENCOUNTER — Encounter: Payer: Self-pay | Admitting: Vascular Surgery

## 2022-01-25 DIAGNOSIS — K219 Gastro-esophageal reflux disease without esophagitis: Secondary | ICD-10-CM | POA: Diagnosis not present

## 2022-01-25 DIAGNOSIS — K922 Gastrointestinal hemorrhage, unspecified: Secondary | ICD-10-CM | POA: Diagnosis not present

## 2022-01-25 DIAGNOSIS — K2981 Duodenitis with bleeding: Secondary | ICD-10-CM | POA: Diagnosis not present

## 2022-01-25 DIAGNOSIS — J449 Chronic obstructive pulmonary disease, unspecified: Secondary | ICD-10-CM | POA: Diagnosis not present

## 2022-01-25 LAB — CBC
HCT: 27.2 % — ABNORMAL LOW (ref 36.0–46.0)
Hemoglobin: 8.7 g/dL — ABNORMAL LOW (ref 12.0–15.0)
MCH: 30.7 pg (ref 26.0–34.0)
MCHC: 32 g/dL (ref 30.0–36.0)
MCV: 96.1 fL (ref 80.0–100.0)
Platelets: 313 10*3/uL (ref 150–400)
RBC: 2.83 MIL/uL — ABNORMAL LOW (ref 3.87–5.11)
RDW: 16.4 % — ABNORMAL HIGH (ref 11.5–15.5)
WBC: 9.9 10*3/uL (ref 4.0–10.5)
nRBC: 0 % (ref 0.0–0.2)

## 2022-01-25 MED ORDER — LIDOCAINE 5 % EX PTCH
1.0000 | MEDICATED_PATCH | CUTANEOUS | Status: DC
  Administered 2022-01-25 – 2022-01-26 (×2): 1 via TRANSDERMAL
  Filled 2022-01-25 (×2): qty 1

## 2022-01-25 MED ORDER — SALINE SPRAY 0.65 % NA SOLN
1.0000 | NASAL | Status: DC | PRN
Start: 2022-01-25 — End: 2022-01-27
  Administered 2022-01-25: 1 via NASAL
  Filled 2022-01-25: qty 44

## 2022-01-25 NOTE — Progress Notes (Signed)
Physical Therapy Treatment Patient Details Name: [name] MRN: [id number] DOB: 08/02/1940 Today's Date: 01/25/2022   History of Present Illness Per MD: [name] is a 81 y.o. female with past medical history of COPD, hemorrhoids, GERD, anemia, chronic pain who presents with generalized weakness and hypotension found by EMS.    PT Comments    Pt received in Semi-Fowler's position and agreeable to therapy.  Pt performed well with all transfers and able to mobilize well.  Pt able to ambulate around the nursing station x2 with good technique.  Pt still has narrow base of support and takes smaller steps, but is able to manage well with CGA.  Pt notes she is feeling much better today as well.  Pt still not at baseline and would benefit from short stint at SNF due to lack of caregiver support at home.      Recommendations for follow up therapy are one component of a multi-disciplinary discharge planning process, led by the attending physician.  Recommendations may be updated based on patient status, additional functional criteria and insurance authorization.  Follow Up Recommendations  Skilled nursing-short term rehab (<3 hours/day) Can patient physically be transported by private vehicle: No   Assistance Recommended at Discharge Frequent or constant Supervision/Assistance  Patient can return home with the following A little help with walking and/or transfers;A little help with bathing/dressing/bathroom;Assist for transportation;Help with stairs or ramp for entrance   Equipment Recommendations  None recommended by PT    Recommendations for Other Services       Precautions / Restrictions Restrictions Weight Bearing Restrictions: No     Mobility  Bed Mobility Overal bed mobility: Modified Independent             General bed mobility comments: use of bed features, but modI    Transfers Overall transfer level: Needs assistance Equipment used: Rolling walker (2  wheels) Transfers: Sit to/from Stand Sit to Stand: Min guard                Ambulation/Gait Ambulation/Gait assistance: Min guard Gait Distance (Feet): 320 Feet Assistive device: Rolling walker (2 wheels) Gait Pattern/deviations: Step-through pattern, Decreased step length - right, Decreased step length - left, Decreased stride length, Steppage, Shuffle, Narrow base of support Gait velocity: significantly decreased.     General Gait Details: Pt with extremely small steps and tends to look down at feet rather than up.  Very narrow BOS as well.   Stairs             Wheelchair Mobility    Modified Rankin (Stroke Patients Only)       Balance Overall balance assessment: Needs assistance Sitting-balance support: No upper extremity supported Sitting balance-Leahy Scale: Fair     Standing balance support: Bilateral upper extremity supported, During functional activity, Reliant on assistive device for balance Standing balance-Leahy Scale: Fair                              Cognition Arousal/Alertness: Awake/alert Behavior During Therapy: WFL for tasks assessed/performed Overall Cognitive Status: Within Functional Limits for tasks assessed                                 General Comments: somewhat HOH.        Exercises      General Comments        Pertinent Vitals/Pain Pain  Assessment Pain Assessment: No/denies pain    Home Living                          Prior Function            PT Goals (current goals can now be found in the care plan section) Acute Rehab PT Goals Patient Stated Goal: to get stronger. PT Goal Formulation: With patient Time For Goal Achievement: 02/07/22 Potential to Achieve Goals: Fair Progress towards PT goals: Progressing toward goals    Frequency    Min 2X/week      PT Plan      Co-evaluation              AM-PAC PT "6 Clicks" Mobility   Outcome Measure  Help needed  turning from your back to your side while in a flat bed without using bedrails?: A Little Help needed moving from lying on your back to sitting on the side of a flat bed without using bedrails?: A Little Help needed moving to and from a bed to a chair (including a wheelchair)?: A Little Help needed standing up from a chair using your arms (e.g., wheelchair or bedside chair)?: A Little Help needed to walk in hospital room?: A Little Help needed climbing 3-5 steps with a railing? : A Lot 6 Click Score: 17    End of Session Equipment Utilized During Treatment: Gait belt Activity Tolerance: Patient tolerated treatment well Patient left: in bed;with call bell/phone within reach;with bed alarm set Nurse Communication: Mobility status PT Visit Diagnosis: Unsteadiness on feet (R26.81);Other abnormalities of gait and mobility (R26.89);Muscle weakness (generalized) (M62.81);History of falling (Z91.81);Difficulty in walking, not elsewhere classified (R26.2)     Time: 0981-1914 PT Time Calculation (min) (ACUTE ONLY): 31 min  Charges:  $Gait Training: 23-37 mins                     Gwenlyn Saran, PT, DPT 01/25/22, 5:29 PM

## 2022-01-25 NOTE — NC FL2 (Signed)
[place] LEVEL OF CARE SCREENING TOOL     IDENTIFICATION  Patient Name: [name] Birthdate: 29-Aug-1940 Sex: female Admission Date (Current Location): 01/21/2022  [facility] [place] and [place] Number:  Engineering geologist and Address:  [facility], [address] [place], [place] [address]      Provider Number: [id number]  Attending Physician Name and Address:  Nolberto Hanlon, MD  Relative Name and Phone Number:  Kawena, Lyday [phone]   [phone]    Current Level of Care: Hospital Recommended Level of Care: [place] Prior Approval Number:    Date Approved/Denied:   PASRR Number: [id number]  Discharge Plan: SNF    Current Diagnoses: Patient Active Problem List   Diagnosis Date Noted   Upper GI bleed    GIB (gastrointestinal bleeding) 01/21/2022   Hypotension 01/21/2022   Iron deficiency anemia 10/21/2021   Intractable nausea and vomiting 10/12/2021   Colitis 10/12/2021   Hyperlipidemia 10/12/2021   Hypokalemia 10/12/2021   Near syncope 10/12/2021   TIA (transient ischemic attack) 12/07/2019   Cataracts, bilateral 11/24/2016   Adenomatous polyps 11/24/2016   Low back pain 11/24/2016   Adult idiopathic generalized osteoporosis 06/08/2016   Vitamin B12 deficiency anemia due to intrinsic factor deficiency 04/22/2016   Closed intertrochanteric fracture of right femur ([place]) 03/30/2016   History of nonmelanoma skin cancer 11/17/2015   Compression fracture of lumbar vertebra ([place]) 05/27/2015   Valvular heart disease 03/21/2015   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   Pathological fracture due to osteoporosis 03/04/2015   RA (rheumatoid arthritis) ([place]) 02/18/2015   Arthritis, rheumatoid (HCC)    Thyroid binding globulin deficiency    Breast pain    GERD (gastroesophageal reflux disease)    Tobacco abuse    Chest discomfort    Abdominal pain    Hypothyroid    Renal artery  stenosis (HCC)    COPD (chronic obstructive pulmonary disease) (HCC)    Hx of colonic polyps    Anxiety    Hemorrhoids    Gastritis    Diverticulosis    Fibrocystic breast disease    Constipation    Osteoporosis 10/10/2013   Pernicious anemia 10/10/2013   Chronic pain syndrome 08/03/2007    Orientation RESPIRATION BLADDER Height & Weight     Self, Time, Situation, Place  Normal Continent Weight: 46.3 kg Height:  '5\' 4"'$  (162.6 cm)  BEHAVIORAL SYMPTOMS/MOOD NEUROLOGICAL BOWEL NUTRITION STATUS      Continent Diet (SOFT)  AMBULATORY STATUS COMMUNICATION OF NEEDS Skin   Limited Assist Verbally Normal (ecchymosis to bilateral arms)                       Personal Care Assistance Level of Assistance  Bathing, Feeding, Dressing Bathing Assistance: Limited assistance Feeding assistance: Independent Dressing Assistance: Limited assistance     Functional Limitations Info  Sight, Hearing, Speech Sight Info: Adequate Hearing Info: Adequate Speech Info: Adequate    SPECIAL CARE FACTORS FREQUENCY  PT (By licensed PT), OT (By licensed OT)     PT Frequency: MIn 5x weekly OT Frequency: Min 5x weekly            Contractures Contractures Info: Not present    Additional Factors Info  Code Status, Allergies Code Status Info: DNR Allergies Info: Sulfa Antibiotics High   . Allergen: desbutal  Denosumab  Itching   Etrafon (perphenazine-amitriptyline)  Hydrocodone Nausea And Vomiting,   Mirtazapine reaction(s): Hallucination,  Doxycycline  Nausea  Only,   Percocet (oxycodone-acetaminophen) Nausea And Vomiting,   Sertraline   Palpitations           Current Medications (01/25/2022):  This is the current hospital active medication list Current Facility-Administered Medications  Medication Dose Route Frequency Provider Last Rate Last Admin   0.9 %  sodium chloride infusion  250 mL Intravenous PRN Schnier, Dolores Lory, MD       atorvastatin (LIPITOR) tablet 10 mg  10 mg Oral Daily  Nolberto Hanlon, MD   10 mg at 01/25/22 0827   Chlorhexidine Gluconate Cloth 2 % PADS 6 each  6 each Topical Daily Schnier, Dolores Lory, MD   6 each at 01/25/22 2633   levothyroxine (SYNTHROID) tablet 50 mcg  50 mcg Oral Daily Schnier, Dolores Lory, MD   50 mcg at 01/25/22 0551   lidocaine (LIDODERM) 5 % 1 patch  1 patch Transdermal Q24H Katha Cabal, MD   1 patch at 01/24/22 2114   nicotine (NICODERM CQ - dosed in mg/24 hours) patch 14 mg  14 mg Transdermal Daily Schnier, Dolores Lory, MD   14 mg at 01/25/22 0830   ondansetron (ZOFRAN) injection 4 mg  4 mg Intravenous Q6H PRN Schnier, Dolores Lory, MD   4 mg at 01/22/22 1820   pantoprazole (PROTONIX) injection 40 mg  40 mg Intravenous Q12H Katha Cabal, MD   40 mg at 01/25/22 3545   sodium chloride flush (NS) 0.9 % injection 3 mL  3 mL Intravenous Q12H Schnier, Dolores Lory, MD   3 mL at 01/25/22 6256   sodium chloride flush (NS) 0.9 % injection 3 mL  3 mL Intravenous PRN Schnier, Dolores Lory, MD         Discharge Medications: Please see discharge summary for a list of discharge medications.  Relevant Imaging Results:  Relevant Lab Results:   Additional Information SSN [id number]  Pete Pelt, RN

## 2022-01-25 NOTE — Progress Notes (Signed)
Patient complaining of nasal bleeding related to "dry air." Minimal bright red blood noted on tissue when patient blows her nose. MD made aware of request for saline nasal spray.

## 2022-01-25 NOTE — Progress Notes (Signed)
PT Cancellation Note  Patient Details Name: [name] MRN: [id number] DOB: 1940/12/18   Cancelled Treatment:    Reason Eval/Treat Not Completed: Other (comment).  Pt deferred therapy at this time and requested to be seen at later time as she just got comfortable in bed and did not sleep well last night.  Pt will be seen at later today if time allows.     Gwenlyn Saran, PT, DPT 01/25/22, 2:55 PM

## 2022-01-25 NOTE — Progress Notes (Signed)
PROGRESS NOTE    [name]  VOJ:[id number] DOB: 20-Sep-1940 DOA: 01/21/2022 PCP: Rusty Aus, MD    Brief Narrative:  [name] is a 81 y.o. female with medical history significant of rheumatoid arthritis, COPD, GERD, hypothyroidism, ongoing tobacco abuse presents with complaints of weakness, found hypotensive, and Hg 4.5.s/p 2 units prbc. GI consulted.  8/19 status post EGD and embolization of gastroduodenal artery by vascular on 6 8/18 8./21 h/h stable.  PT recommends SNF  Consultants:  Gi, vascular surgery, general surgery  Procedures:   Antimicrobials:      Subjective: No abdominal pain, nausea or shortness of breath  Objective: Vitals:   01/25/22 0013 01/25/22 0418 01/25/22 0424 01/25/22 0604  BP: 109/60 99/63  97/68  Pulse: 100 95  88  Resp: 16     Temp: 98.7 F (37.1 C) 99.2 F (37.3 C)    TempSrc: Oral     SpO2: 98% 97%    Weight:   46.3 kg   Height:        Intake/Output Summary (Last 24 hours) at 01/25/2022 0835 Last data filed at 01/24/2022 2130 Gross per 24 hour  Intake 600 ml  Output 350 ml  Net 250 ml   Filed Weights   01/21/22 1900 01/23/22 0303 01/25/22 0424  Weight: 41.6 kg 38.1 kg 46.3 kg    Examination: Calm, NAD Cta no w/r Reg s1/s2 no gallop Soft benign +bs No edema Awake and alert grossly intact Mood and affect appropriate in current setting     Data Reviewed: I have personally reviewed following labs and imaging studies  CBC: Recent Labs  Lab 01/21/22 1455 01/22/22 0201 01/22/22 1552 01/23/22 1055 01/24/22 0910  WBC 8.6 9.4  --   --  12.3*  NEUTROABS 6.6  --   --   --   --   HGB 4.5* 7.8* 7.4* 8.9* 9.1*  HCT 14.2* 23.4* 22.4* 26.7* 27.3*  MCV 99.3 90.7  --   --  93.5  PLT 203 208  --   --  993   Basic Metabolic Panel: Recent Labs  Lab 01/21/22 1455  NA 135  K 3.6  CL 106  CO2 24  GLUCOSE 93  BUN 39*  CREATININE 0.80  CALCIUM 7.5*   GFR: Estimated Creatinine Clearance: 41 mL/min (by  C-G formula based on SCr of 0.8 mg/dL). Liver Function Tests: Recent Labs  Lab 01/21/22 1455  AST 19  ALT 13  ALKPHOS 41  BILITOT 0.5  PROT 4.8*  ALBUMIN 2.5*   No results for input(s): "LIPASE", "AMYLASE" in the last 168 hours. No results for input(s): "AMMONIA" in the last 168 hours. Coagulation Profile: Recent Labs  Lab 01/21/22 1455  INR 1.4*   Cardiac Enzymes: No results for input(s): "CKTOTAL", "CKMB", "CKMBINDEX", "TROPONINI" in the last 168 hours. BNP (last 3 results) No results for input(s): "PROBNP" in the last 8760 hours. HbA1C: No results for input(s): "HGBA1C" in the last 72 hours. CBG: Recent Labs  Lab 01/21/22 1900  GLUCAP 82   Lipid Profile: No results for input(s): "CHOL", "HDL", "LDLCALC", "TRIG", "CHOLHDL", "LDLDIRECT" in the last 72 hours. Thyroid Function Tests: No results for input(s): "TSH", "T4TOTAL", "FREET4", "T3FREE", "THYROIDAB" in the last 72 hours. Anemia Panel: No results for input(s): "VITAMINB12", "FOLATE", "FERRITIN", "TIBC", "IRON", "RETICCTPCT" in the last 72 hours. Sepsis Labs: Recent Labs  Lab 01/21/22 1517 01/21/22 1735  LATICACIDVEN 3.4* 1.1    Recent Results (from the past 240 hour(s))  Blood Culture (  routine x 2)     Status: None (Preliminary result)   Collection Time: 01/21/22  3:17 PM   Specimen: BLOOD  Result Value Ref Range Status   Specimen Description BLOOD BLOOD LEFT FOREARM  Final   Special Requests   Final    BOTTLES DRAWN AEROBIC AND ANAEROBIC Blood Culture adequate volume   Culture   Final    NO GROWTH 4 DAYS Performed at [facility], [address] [place], [place] [address]    Report Status PENDING  Incomplete  Blood Culture (routine x 2)     Status: None (Preliminary result)   Collection Time: 01/21/22  3:17 PM   Specimen: BLOOD  Result Value Ref Range Status   Specimen Description BLOOD BLOOD LEFT HAND  Final   Special Requests   Final    BOTTLES DRAWN AEROBIC AND ANAEROBIC Blood  Culture results may not be optimal due to an excessive volume of blood received in culture bottles   Culture   Final    NO GROWTH 4 DAYS Performed at [facility], [address] [place], [place] [address]    Report Status PENDING  Incomplete  MRSA Next Gen by PCR, Nasal     Status: None   Collection Time: 01/21/22  8:26 PM   Specimen: Nasal Mucosa; Nasal Swab  Result Value Ref Range Status   MRSA by PCR Next Gen NOT DETECTED NOT DETECTED Final    Comment: (NOTE) The GeneXpert MRSA Assay (FDA approved for NASAL specimens only), is one component of a comprehensive MRSA colonization surveillance program. It is not intended to diagnose MRSA infection nor to guide or monitor treatment for MRSA infections. Test performance is not FDA approved in patients less than 60 years old. Performed at [facility], [address] [place], [place] [address]   Urine Culture     Status: None   Collection Time: 01/22/22  6:01 AM   Specimen: Urine, Random  Result Value Ref Range Status   Specimen Description   Final    URINE, RANDOM Performed at [facility], [address], [place], [place] [address]    Special Requests   Final    NONE Performed at [facility], [address], [place], [place] [address]    Culture   Final    NO GROWTH Performed at [facility], [place] [address] [place], [place] [address]    Report Status 01/23/2022 FINAL  Final         Radiology Studies: No results found.      Scheduled Meds:  atorvastatin  10 mg Oral Daily   Chlorhexidine Gluconate Cloth  6 each Topical Daily   levothyroxine  50 mcg Oral Daily   lidocaine  1 patch Transdermal Q24H   nicotine  14 mg Transdermal Daily   pantoprazole (PROTONIX) IV  40 mg Intravenous Q12H   sodium chloride flush  3 mL Intravenous Q12H   Continuous Infusions:  sodium chloride      Assessment & Plan:   Principal Problem:   GIB (gastrointestinal bleeding) Active  Problems:   RA (rheumatoid arthritis) (HCC)   Hyperlipidemia   GERD (gastroesophageal reflux disease)   Tobacco abuse   Hypothyroid   COPD (chronic obstructive pulmonary disease) (HCC)   Hypotension   Upper GI bleed   Acute GIB Patient found with hypotension and dark-colored stool On NSAIDS and ASA, likely culprit Hemoglobin 4.5, was 10.6 on 12/23/2021 . Cta/p as above GI consulted by  ED S/p 2units prbc 1 unit FFP status post EGD found with active small bowel bleed.  Had clips but not completely stopped.  GI recommended vascular surgery for embolization near the clips.  Vascular surgery consulted Dr. Delana Meyer is made aware S/p arterial embolization by vascular on 8/18 Iv ppi 8/21 h/h stable. Cbc pending today Transition to po ppi as outpatient  2.  Hypotension Due to hypovolemia from GI bleed 8/21 Improved with ivf    3. GERD Continue PPI.  4. Hyperlipidemia statins   5. Hypothryoid On synthroid.      6.COPD On RA. No acute exacerbation   7. Tobacco abuse Patient continues to smoke.  Counseled patient extensively on smoking cessation Nicotine patch   8.RA On methotrexate weekly.    DVT prophylaxis: SCD Code Status: DNR Family Communication: none at bedside Disposition Plan:  Status is: Inpatient Remains inpatient appropriate because: unsafe discharge. SNF pending.       LOS: 4 days   Time spent: 35 min    Nolberto Hanlon, MD [facility] Pager [phone]  If 7PM-7AM, please contact night-coverage 01/25/2022, 8:35 AM

## 2022-01-25 NOTE — TOC Initial Note (Addendum)
Transition of Care [facility]) - Initial/Assessment Note    Patient Details  Name: [name] MRN: [id number] Date of Birth: 31-May-1941  [facility]) CM/SW Contact:    Pete Pelt, RN Phone Number: 01/25/2022, 9:58 AM  Clinical Narrative:    Patient typically lives at home, is independent.  She typically drives to her appointments and the pharmacy, but she is concerned that she is not able to drive at this time.  Her brother does help with driving and assists patient at home. Patient has a daughter in [place], however she travels with her job, and is not always available. Patient asked that RNCM speak with brother about SNF, as she in agreement .    RNCM notified brother    [place], [place] [phone]   [phone]    Brother states that he would like [facility] as their first choice.  Magda Paganini from facility notified.  Bed search started.  Toc to follow   Addendum 1512-[facility] has offered a bed. Message left for brother.  Expected Discharge Plan: Skilled Nursing Facility Barriers to Discharge: Continued Medical Work up   Patient Goals and CMS Choice     Choice offered to / list presented to : Patient (brother)  Expected Discharge Plan and Services Expected Discharge Plan: [place] Acute Care Choice: [place] arrangements for the past 2 months: Single Family Home                                      Prior Living Arrangements/Services Living arrangements for the past 2 months: Single Family Home Lives with:: Self Patient language and need for interpreter reviewed:: Yes Do you feel safe going back to the place where you live?: Yes (After rehab)      Need for Family Participation in Patient Care: Yes (Comment) Care giver support system in place?: Yes (comment)   Criminal Activity/Legal Involvement Pertinent to Current Situation/Hospitalization: No - Comment as needed  Activities  of Daily Living Home Assistive Devices/Equipment: None ADL Screening (condition at time of admission) Patient's cognitive ability adequate to safely complete daily activities?: Yes Is the patient deaf or have difficulty hearing?: No Does the patient have difficulty seeing, even when wearing glasses/contacts?: No Does the patient have difficulty concentrating, remembering, or making decisions?: No Patient able to express need for assistance with ADLs?: Yes Does the patient have difficulty dressing or bathing?: No Independently performs ADLs?: Yes (appropriate for developmental age) Does the patient have difficulty walking or climbing stairs?: Yes Weakness of Legs: Both Weakness of Arms/Hands: None  Permission Sought/Granted Permission sought to share information with : Case Manager, Chartered certified accountant granted to share information with : Yes, Verbal Permission Granted     Permission granted to share info w AGENCY: Prospective SNF        Emotional Assessment Appearance:: Appears stated age Attitude/Demeanor/Rapport: Gracious, Engaged Affect (typically observed): Pleasant (Memory impairment at times) Orientation: : Oriented to Self, Oriented to Place, Oriented to  Time, Fluctuating Orientation (Suspected and/or reported Sundowners) Alcohol / Substance Use: Not Applicable Psych Involvement: No (comment)  Admission diagnosis:  Shock ([place]) [R57.9] GIB (gastrointestinal bleeding) [K92.2] Upper GI bleed [K92.2] Anemia due to blood loss, acute [D62] Patient Active Problem List   Diagnosis Date Noted   Upper GI bleed    GIB (gastrointestinal bleeding) 01/21/2022   Hypotension 01/21/2022  Iron deficiency anemia 10/21/2021   Intractable nausea and vomiting 10/12/2021   Colitis 10/12/2021   Hyperlipidemia 10/12/2021   Hypokalemia 10/12/2021   Near syncope 10/12/2021   TIA (transient ischemic attack) 12/07/2019   Cataracts, bilateral 11/24/2016   Adenomatous  polyps 11/24/2016   Low back pain 11/24/2016   Adult idiopathic generalized osteoporosis 06/08/2016   Vitamin B12 deficiency anemia due to intrinsic factor deficiency 04/22/2016   Closed intertrochanteric fracture of right femur ([place]) 03/30/2016   History of nonmelanoma skin cancer 11/17/2015   Compression fracture of lumbar vertebra ([place]) 05/27/2015   Valvular heart disease 03/21/2015   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   Pathological fracture due to osteoporosis 03/04/2015   RA (rheumatoid arthritis) ([place]) 02/18/2015   Arthritis, rheumatoid (HCC)    Thyroid binding globulin deficiency    Breast pain    GERD (gastroesophageal reflux disease)    Tobacco abuse    Chest discomfort    Abdominal pain    Hypothyroid    Renal artery stenosis (HCC)    COPD (chronic obstructive pulmonary disease) ([place])    Hx of colonic polyps    Anxiety    Hemorrhoids    Gastritis    Diverticulosis    Fibrocystic breast disease    Constipation    Osteoporosis 10/10/2013   Pernicious anemia 10/10/2013   Chronic pain syndrome 08/03/2007   PCP:  Rusty Aus, MD Pharmacy:   [id number] [place], [place] [address] AT [facility] [address] [place] [address] Phone: [phone] Fax: [phone]     Social Determinants of Health (SDOH) Interventions    Readmission Risk Interventions     No data to display

## 2022-01-25 NOTE — Plan of Care (Signed)

## 2022-01-26 DIAGNOSIS — K2981 Duodenitis with bleeding: Secondary | ICD-10-CM | POA: Diagnosis not present

## 2022-01-26 DIAGNOSIS — K219 Gastro-esophageal reflux disease without esophagitis: Secondary | ICD-10-CM | POA: Diagnosis not present

## 2022-01-26 DIAGNOSIS — J449 Chronic obstructive pulmonary disease, unspecified: Secondary | ICD-10-CM | POA: Diagnosis not present

## 2022-01-26 DIAGNOSIS — K922 Gastrointestinal hemorrhage, unspecified: Secondary | ICD-10-CM | POA: Diagnosis not present

## 2022-01-26 MED ORDER — GALANTAMINE HYDROBROMIDE 4 MG PO TABS
8.0000 mg | ORAL_TABLET | Freq: Every day | ORAL | Status: DC
Start: 2022-01-27 — End: 2022-01-27
  Administered 2022-01-27: 8 mg via ORAL
  Filled 2022-01-26: qty 2

## 2022-01-26 MED ORDER — PANTOPRAZOLE SODIUM 40 MG PO TBEC
40.0000 mg | DELAYED_RELEASE_TABLET | Freq: Two times a day (BID) | ORAL | Status: DC
  Administered 2022-01-26 – 2022-01-27 (×2): 40 mg via ORAL
  Filled 2022-01-26 (×2): qty 1

## 2022-01-26 MED ORDER — DULOXETINE HCL 30 MG PO CPEP
60.0000 mg | ORAL_CAPSULE | Freq: Every day | ORAL | Status: DC
  Administered 2022-01-26 – 2022-01-27 (×2): 60 mg via ORAL
  Filled 2022-01-26 (×2): qty 2

## 2022-01-26 MED ORDER — BUPROPION HCL ER (XL) 150 MG PO TB24
150.0000 mg | ORAL_TABLET | Freq: Every day | ORAL | Status: DC
  Administered 2022-01-26 – 2022-01-27 (×2): 150 mg via ORAL
  Filled 2022-01-26 (×2): qty 1

## 2022-01-26 MED ORDER — TIZANIDINE HCL 2 MG PO TABS
2.0000 mg | ORAL_TABLET | Freq: Two times a day (BID) | ORAL | Status: DC
  Administered 2022-01-26 – 2022-01-27 (×3): 2 mg via ORAL
  Filled 2022-01-26 (×3): qty 1

## 2022-01-26 NOTE — Progress Notes (Signed)
PROGRESS NOTE    [name]  FYB:[id number] DOB: Oct 06, 1940 DOA: 01/21/2022 PCP: Rusty Aus, MD    Brief Narrative:  [name] is a 81 y.o. female with medical history significant of rheumatoid arthritis, COPD, GERD, hypothyroidism, ongoing tobacco abuse presents with complaints of weakness, found hypotensive, and Hg 4.5.s/p 2 units prbc. GI consulted. status post EGD and embolization of gastroduodenal artery by vascular on 6 8/18  PT recommends SNF-pending   Consultants:  Gi, vascular surgery, general surgery  Procedures:   Antimicrobials:      Subjective: No shortness of breath, chest pain   Objective: Vitals:   01/25/22 1947 01/25/22 2341 01/26/22 0504 01/26/22 0748  BP: 100/61 118/67 118/67 121/83  Pulse: 93 (!) 49 (!) 107 (!) 106  Resp: '18 20 18 16  '$ Temp: 98.7 F (37.1 C) 99.3 F (37.4 C) 98.9 F (37.2 C) 98.4 F (36.9 C)  TempSrc:    Oral  SpO2: 98% 99% 98% 99%  Weight:      Height:        Intake/Output Summary (Last 24 hours) at 01/26/2022 1105 Last data filed at 01/26/2022 0616 Gross per 24 hour  Intake 240 ml  Output 1200 ml  Net -960 ml   Filed Weights   01/21/22 1900 01/23/22 0303 01/25/22 0424  Weight: 41.6 kg 38.1 kg 46.3 kg    Examination: Calm, NAD Cta no w/r Reg s1/s2 no gallop Soft benign +bs No edema Grossly intact Mood and affect appropriate in current setting     Data Reviewed: I have personally reviewed following labs and imaging studies  CBC: Recent Labs  Lab 01/21/22 1455 01/22/22 0201 01/22/22 1552 01/23/22 1055 01/24/22 0910 01/25/22 1416  WBC 8.6 9.4  --   --  12.3* 9.9  NEUTROABS 6.6  --   --   --   --   --   HGB 4.5* 7.8* 7.4* 8.9* 9.1* 8.7*  HCT 14.2* 23.4* 22.4* 26.7* 27.3* 27.2*  MCV 99.3 90.7  --   --  93.5 96.1  PLT 203 208  --   --  297 527   Basic Metabolic Panel: Recent Labs  Lab 01/21/22 1455  NA 135  K 3.6  CL 106  CO2 24  GLUCOSE 93  BUN 39*  CREATININE 0.80   CALCIUM 7.5*   GFR: Estimated Creatinine Clearance: 41 mL/min (by C-G formula based on SCr of 0.8 mg/dL). Liver Function Tests: Recent Labs  Lab 01/21/22 1455  AST 19  ALT 13  ALKPHOS 41  BILITOT 0.5  PROT 4.8*  ALBUMIN 2.5*   No results for input(s): "LIPASE", "AMYLASE" in the last 168 hours. No results for input(s): "AMMONIA" in the last 168 hours. Coagulation Profile: Recent Labs  Lab 01/21/22 1455  INR 1.4*   Cardiac Enzymes: No results for input(s): "CKTOTAL", "CKMB", "CKMBINDEX", "TROPONINI" in the last 168 hours. BNP (last 3 results) No results for input(s): "PROBNP" in the last 8760 hours. HbA1C: No results for input(s): "HGBA1C" in the last 72 hours. CBG: Recent Labs  Lab 01/21/22 1900  GLUCAP 82   Lipid Profile: No results for input(s): "CHOL", "HDL", "LDLCALC", "TRIG", "CHOLHDL", "LDLDIRECT" in the last 72 hours. Thyroid Function Tests: No results for input(s): "TSH", "T4TOTAL", "FREET4", "T3FREE", "THYROIDAB" in the last 72 hours. Anemia Panel: No results for input(s): "VITAMINB12", "FOLATE", "FERRITIN", "TIBC", "IRON", "RETICCTPCT" in the last 72 hours. Sepsis Labs: Recent Labs  Lab 01/21/22 1517 01/21/22 1735  LATICACIDVEN 3.4* 1.1  Recent Results (from the past 240 hour(s))  Blood Culture (routine x 2)     Status: None (Preliminary result)   Collection Time: 01/21/22  3:17 PM   Specimen: BLOOD  Result Value Ref Range Status   Specimen Description BLOOD BLOOD LEFT FOREARM  Final   Special Requests   Final    BOTTLES DRAWN AEROBIC AND ANAEROBIC Blood Culture adequate volume   Culture   Final    NO GROWTH 4 DAYS Performed at [facility], [address] [place], [place] [address]    Report Status PENDING  Incomplete  Blood Culture (routine x 2)     Status: None (Preliminary result)   Collection Time: 01/21/22  3:17 PM   Specimen: BLOOD  Result Value Ref Range Status   Specimen Description BLOOD BLOOD LEFT HAND  Final    Special Requests   Final    BOTTLES DRAWN AEROBIC AND ANAEROBIC Blood Culture results may not be optimal due to an excessive volume of blood received in culture bottles   Culture   Final    NO GROWTH 4 DAYS Performed at [facility], [address], [place], [place] [address]    Report Status PENDING  Incomplete  MRSA Next Gen by PCR, Nasal     Status: None   Collection Time: 01/21/22  8:26 PM   Specimen: Nasal Mucosa; Nasal Swab  Result Value Ref Range Status   MRSA by PCR Next Gen NOT DETECTED NOT DETECTED Final    Comment: (NOTE) The GeneXpert MRSA Assay (FDA approved for NASAL specimens only), is one component of a comprehensive MRSA colonization surveillance program. It is not intended to diagnose MRSA infection nor to guide or monitor treatment for MRSA infections. Test performance is not FDA approved in patients less than 64 years old. Performed at [facility], [address], [place], [place] [address]   Urine Culture     Status: None   Collection Time: 01/22/22  6:01 AM   Specimen: Urine, Random  Result Value Ref Range Status   Specimen Description   Final    URINE, RANDOM Performed at [facility], [address], [place], [place] [address]    Special Requests   Final    NONE Performed at [facility], [address] [place], [place] [address]    Culture   Final    NO GROWTH Performed at [facility], [place] [address], [place], [place] [address]    Report Status 01/23/2022 FINAL  Final         Radiology Studies: No results found.      Scheduled Meds:  atorvastatin  10 mg Oral Daily   Chlorhexidine Gluconate Cloth  6 each Topical Daily   levothyroxine  50 mcg Oral Daily   lidocaine  1 patch Transdermal Q24H   lidocaine  1 patch Transdermal Q24H   nicotine  14 mg Transdermal Daily   pantoprazole (PROTONIX) IV  40 mg Intravenous Q12H   sodium chloride flush  3 mL Intravenous Q12H   Continuous  Infusions:  sodium chloride      Assessment & Plan:   Principal Problem:   GIB (gastrointestinal bleeding) Active Problems:   RA (rheumatoid arthritis) (HCC)   Hyperlipidemia   GERD (gastroesophageal reflux disease)   Tobacco abuse   Hypothyroid   COPD (chronic obstructive pulmonary disease) (HCC)   Hypotension   Upper GI bleed   Acute GIB Patient found with hypotension and dark-colored stool On  NSAIDS and ASA, likely culprit Hemoglobin 4.5, was 10.6 on 12/23/2021 . Cta/p as above S/p 2units prbc 1 unit FFP status post EGD found with active small bowel bleed.  Had clips but not completely stopped.  GI recommended vascular surgery for embolization near the clips.  Vascular surgery consulted Dr. Delana Meyer -S/p arterial embolization by vascular on 8/18 8/22 transition IV PPI 40 mg p.o. twice daily  2.  Hypotension Due to hypovolemia from GI bleed 8/22 improved    3. GERD Continue PPI as above  4. Hyperlipidemia Continue statin   5. Hypothryoid On synthroid.      6.COPD On RA. No acute exacerbation   7. Tobacco abuse Patient continues to smoke.  Counseled patient extensively on smoking cessation Nicotine patch   8.RA On methotrexate weekly.    DVT prophylaxis: SCD Code Status: DNR Family Communication: none at bedside Disposition Plan:  Status is: Inpatient Remains inpatient appropriate because: unsafe discharge. SNF pending.       LOS: 5 days   Time spent: 35 min    Nolberto Hanlon, MD [facility] Pager [phone]  If 7PM-7AM, please contact night-coverage 01/26/2022, 11:05 AM

## 2022-01-26 NOTE — Plan of Care (Signed)

## 2022-01-26 NOTE — TOC Progression Note (Addendum)
Transition of Care [facility]) - Progression Note    Patient Details  Name: [name] MRN: [id number] Date of Birth: 12/19/1940  [facility]) CM/SW [place], RN Phone Number: 01/26/2022, 11:15 AM  Clinical Narrative:   Brother chose [facility], Magda Paganini made aware, facility can accept patient tomorrow.  Brother made aware.  Addendum 1421, patient agrees to be transported to [facility].  Expected Discharge Plan: [place] Barriers to Discharge: Continued Medical Work up  Expected Discharge Plan and Services Expected Discharge Plan: [place] Choice: [place] arrangements for the past 2 months: Single Family Home                                       Social Determinants of Health (SDOH) Interventions    Readmission Risk Interventions     No data to display

## 2022-01-26 NOTE — Care Management Important Message (Signed)
Important Message  Patient Details  Name: [name] MRN: [id number] Date of Birth: 16-Oct-1940   Medicare Important Message Given:  Yes     Juliann Pulse A Archie Shea 01/26/2022, 2:03 PM

## 2022-01-27 DIAGNOSIS — K2981 Duodenitis with bleeding: Secondary | ICD-10-CM | POA: Diagnosis not present

## 2022-01-27 DIAGNOSIS — K922 Gastrointestinal hemorrhage, unspecified: Secondary | ICD-10-CM | POA: Diagnosis not present

## 2022-01-27 DIAGNOSIS — K219 Gastro-esophageal reflux disease without esophagitis: Secondary | ICD-10-CM | POA: Diagnosis not present

## 2022-01-27 DIAGNOSIS — J449 Chronic obstructive pulmonary disease, unspecified: Secondary | ICD-10-CM | POA: Diagnosis not present

## 2022-01-27 LAB — CULTURE, BLOOD (ROUTINE X 2)
Culture: NO GROWTH
Culture: NO GROWTH
Special Requests: ADEQUATE

## 2022-01-27 MED ORDER — LIDOCAINE 5 % EX PTCH: 1.0000 | MEDICATED_PATCH | CUTANEOUS | 0 refills | Status: DC

## 2022-01-27 MED ORDER — SENNOSIDES-DOCUSATE SODIUM 8.6-50 MG PO TABS: 2.0000 | ORAL_TABLET | Freq: Two times a day (BID) | ORAL | Status: DC | PRN

## 2022-01-27 MED ORDER — SENNOSIDES-DOCUSATE SODIUM 8.6-50 MG PO TABS
2.0000 | ORAL_TABLET | Freq: Two times a day (BID) | ORAL | 0 refills | Status: AC | PRN
Start: 2022-01-27 — End: ?

## 2022-01-27 MED ORDER — PANTOPRAZOLE SODIUM 40 MG PO TBEC: 40.0000 mg | DELAYED_RELEASE_TABLET | Freq: Two times a day (BID) | ORAL | 0 refills | Status: DC

## 2022-01-27 MED ORDER — SENNOSIDES-DOCUSATE SODIUM 8.6-50 MG PO TABS
2.0000 | ORAL_TABLET | Freq: Once | ORAL | Status: AC
  Administered 2022-01-27: 2 via ORAL
  Filled 2022-01-27: qty 2

## 2022-01-27 MED ORDER — NICOTINE 14 MG/24HR TD PT24: 14.0000 mg | MEDICATED_PATCH | Freq: Every day | TRANSDERMAL | 0 refills | Status: DC

## 2022-01-27 NOTE — Hospital Course (Signed)
[name] is a 81 y.o. female with medical history significant of rheumatoid arthritis, COPD, GERD, hypothyroidism, ongoing tobacco abuse presents to ED 01/21/2022 with complaints of weakness, found hypotensive, and Hg 4.5.s/p 2 units prbc. GI consulted. status post EGD and embolization of gastroduodenal artery by vascular on 08/18. PT recommended placement for SNF which has been pending since 08/20 until discharge 08/23     Consultants:  GI  vascular surgery general surgery   Procedures:  01/22/2022: EGD  01/22/2022: gastroduodenal artery embolization by vascular surgery

## 2022-01-27 NOTE — Progress Notes (Signed)
Pt discharged via EMS. PIV and tele removed. Pt belongings packed up and returned. Discharge packet given to EMS.

## 2022-01-27 NOTE — Plan of Care (Signed)

## 2022-01-27 NOTE — Discharge Summary (Signed)
Physician Discharge Summary   Patient: [name] MRN: [id number]  DOB: [birth year]   Admit:     Date of Admission: 01/21/2022 Admitted from: home   Discharge: Date of discharge: 01/27/22 Disposition: Skilled nursing facility Condition at discharge: good  CODE STATUS: DNR     Discharge Physician: Emeterio Reeve, DO [facility]     PCP: Rusty Aus, MD  Recommendations for Outpatient Follow-up:  Follow up with PCP Rusty Aus, MD in 2 weeks Please obtain labs/tests: CBC, CMP Please follow up on the following pending results: none See below for other instructions: Discharge Instructions     Call MD for:  persistant dizziness or light-headedness   Complete by: As directed    Or signs of bleeding   Diet - low sodium heart healthy   Complete by: As directed    Discharge instructions   Complete by: As directed    Holding aspirin for now - may restart or discontinue per PCP or GI  obtain CBC on or approximately Monday 02/01/2022 Schedule follow up with GI no later than one month from discharge   Increase activity slowly   Complete by: As directed       Discharge Instructions     Call MD for:  persistant dizziness or light-headedness   Complete by: As directed    Or signs of bleeding   Diet - low sodium heart healthy   Complete by: As directed    Discharge instructions   Complete by: As directed    Holding aspirin for now - may restart or discontinue per PCP or GI  obtain CBC on or approximately Monday 02/01/2022 Schedule follow up with GI no later than one month from discharge   Increase activity slowly   Complete by: As directed            Hospital Course: [name] is a 81 y.o. female with medical history significant of rheumatoid arthritis, COPD, GERD, hypothyroidism, ongoing tobacco abuse presents to ED 01/21/2022 with complaints of weakness, found hypotensive, and Hg 4.5.s/p 2 units prbc. GI consulted. status  post EGD and embolization of gastroduodenal artery by vascular on 08/18. PT recommended placement for SNF which has been pending since 08/20 until discharge 08/23     Consultants:  GI  vascular surgery general surgery   Procedures:  01/22/2022: EGD  01/22/2022: gastroduodenal artery embolization by vascular surgery         Discharge Diagnoses: Principal Problem:   GIB (gastrointestinal bleeding) Active Problems:   RA (rheumatoid arthritis) ([place])   Hyperlipidemia   GERD (gastroesophageal reflux disease)   Tobacco abuse   Hypothyroid   COPD (chronic obstructive pulmonary disease) ([place])   Hypotension   Upper GI bleed    Assessment & Plan:  Acute GIB -  Patient found with hypotension and dark-colored stool On NSAIDS and ASA, likely culprit - held on discharge  Hemoglobin 4.5, was 10.6 on 12/23/2021 . S/p 2units prbc, 1 unit FFP status post EGD found with active small bowel bleed.  Had clips but not completely stopped.  GI recommended vascular surgery for embolization near the clips.  Vascular surgery consulted Dr. Delana Meyer -S/p arterial embolization by vascular on 8/18 8/22 transition IV PPI to pantoprazole 40 mg p.o. twice daily to continue on dicsharge    2.  Hypotension Due to hypovolemia from GI bleed 8/22 improved    3. GERD Continue PPI as above   4. Hyperlipidemia Continue statin   5.  Hypothryoid On synthroid.     6.COPD On RA. No acute exacerbation   7. Tobacco abuse Patient continues to smoke.  Counseled patient extensively on smoking cessation Nicotine patch   8.RA On methotrexate weekly.       Discharge Instructions  Allergies as of 01/27/2022       Reactions   Other Other (See Comments)   Uncoded Allergy. Allergen: symmetral Uncoded Allergy. Allergen: etrafon Uncoded Allergy. Allergen: desbutal Uncoded Allergy. Allergen: symmetral Uncoded Allergy. Allergen: etrafon Uncoded Allergy. Allergen: desbutal Other reaction(s):  Unknown Uncoded Allergy. Allergen: symmetral Other reaction(s): Unknown Uncoded Allergy. Allergen: etrafon Uncoded Allergy. Allergen: symmetral Uncoded Allergy. Allergen: etrafon Uncoded Allergy. Allergen: desbutal   Sulfa Antibiotics    Other reaction(s): Unknown Uncoded Allergy. Allergen: desbutal   Denosumab Itching   Etrafon [perphenazine-amitriptyline]    Hydrocodone Nausea And Vomiting   Mirtazapine    Other reaction(s): Hallucination   Doxycycline Nausea Only   Percocet [oxycodone-acetaminophen] Nausea And Vomiting   Sertraline Palpitations        Medication List     STOP taking these medications    aspirin EC 81 MG tablet   etodolac 400 MG tablet Commonly known as: LODINE   omeprazole 40 MG capsule Commonly known as: PRILOSEC       TAKE these medications    atorvastatin 10 MG tablet Commonly known as: LIPITOR Take 10 mg by mouth daily.   buPROPion 150 MG 24 hr tablet Commonly known as: WELLBUTRIN XL Take 150 mg by mouth daily.   dicyclomine 10 MG capsule Commonly known as: BENTYL Take 10 mg by mouth 2 (two) times daily as needed.   DULoxetine 60 MG capsule Commonly known as: CYMBALTA Take 60 mg by mouth daily.   folic acid 1 MG tablet Commonly known as: FOLVITE Take 1 tablet by mouth daily.   galantamine 8 MG tablet Commonly known as: RAZADYNE Take 8 mg by mouth daily.   levothyroxine 50 MCG tablet Commonly known as: SYNTHROID Take 50 mcg by mouth daily.   lidocaine 5 % Commonly known as: LIDODERM Place 1 patch onto the skin daily. Remove & Discard patch within 12 hours or as directed by MD What changed: when to take this   methotrexate 250 MG/10ML injection ADMINISTER 0.8 ML ONCE A WEEK   nicotine 14 mg/24hr patch Commonly known as: NICODERM CQ - dosed in mg/24 hours Place 1 patch (14 mg total) onto the skin daily.   ondansetron 4 MG disintegrating tablet Commonly known as: ZOFRAN-ODT Take 1 tablet (4 mg total) by mouth every  8 (eight) hours as needed for nausea or vomiting.   pantoprazole 40 MG tablet Commonly known as: PROTONIX Take 1 tablet (40 mg total) by mouth 2 (two) times daily before a meal.   senna-docusate 8.6-50 MG tablet Commonly known as: Senokot-S Take 2 tablets by mouth 2 (two) times daily as needed for mild constipation or moderate constipation.   tiZANidine 2 MG tablet Commonly known as: ZANAFLEX Take 2 mg by mouth 2 (two) times daily.         Contact information for follow-up providers     Lucilla Lame, MD Follow up in 6 week(s).   Specialty: Gastroenterology Contact information: [place]  [place] [address] [phone]              Contact information for after-discharge care     [facility] Preferred SNF .  Service: Skilled Nursing Contact information: [place] [phone]                     Allergies  Allergen Reactions   Other Other (See Comments)    Uncoded Allergy. Allergen: symmetral Uncoded Allergy. Allergen: etrafon Uncoded Allergy. Allergen: desbutal Uncoded Allergy. Allergen: symmetral Uncoded Allergy. Allergen: etrafon Uncoded Allergy. Allergen: desbutal Other reaction(s): Unknown Uncoded Allergy. Allergen: symmetral Other reaction(s): Unknown Uncoded Allergy. Allergen: etrafon Uncoded Allergy. Allergen: symmetral Uncoded Allergy. Allergen: etrafon Uncoded Allergy. Allergen: desbutal    Sulfa Antibiotics     Other reaction(s): Unknown Uncoded Allergy. Allergen: desbutal   Denosumab Itching   Etrafon [Perphenazine-Amitriptyline]    Hydrocodone Nausea And Vomiting   Mirtazapine     Other reaction(s): Hallucination   Doxycycline Nausea Only   Percocet [Oxycodone-Acetaminophen] Nausea And Vomiting   Sertraline Palpitations     Subjective: pt feeling well today other than  constipation, no CP/SOB   Discharge Exam: BP 114/68 (BP Location: Right Arm)   Pulse 80   Temp 98.3 F (36.8 C)   Resp 14   Ht '5\' 4"'$  (1.626 m)   Wt 46.3 kg   SpO2 98%   BMI 17.52 kg/m  General: Pt is alert, awake, not in acute distress Cardiovascular: RRR, I4/P8 +systolic murmur, no rubs, no gallops Respiratory: CTA bilaterally, no wheezing, no rhonchi Abdominal: Soft, NT, ND, bowel sounds +WNL Extremities: no edema, no cyanosis     The results of significant diagnostics from this hospitalization (including imaging, microbiology, ancillary and laboratory) are listed below for reference.     Microbiology: Recent Results (from the past 240 hour(s))  Blood Culture (routine x 2)     Status: None   Collection Time: 01/21/22  3:17 PM   Specimen: BLOOD  Result Value Ref Range Status   Specimen Description BLOOD BLOOD LEFT FOREARM  Final   Special Requests   Final    BOTTLES DRAWN AEROBIC AND ANAEROBIC Blood Culture adequate volume   Culture   Final    NO GROWTH 6 DAYS Performed at [facility], [address] [place], [place] [address]    Report Status 01/27/2022 FINAL  Final  Blood Culture (routine x 2)     Status: None   Collection Time: 01/21/22  3:17 PM   Specimen: BLOOD  Result Value Ref Range Status   Specimen Description BLOOD BLOOD LEFT HAND  Final   Special Requests   Final    BOTTLES DRAWN AEROBIC AND ANAEROBIC Blood Culture results may not be optimal due to an excessive volume of blood received in culture bottles   Culture   Final    NO GROWTH 6 DAYS Performed at [facility], [address], [place], [place] [address]    Report Status 01/27/2022 FINAL  Final  MRSA Next Gen by PCR, Nasal     Status: None   Collection Time: 01/21/22  8:26 PM   Specimen: Nasal Mucosa; Nasal Swab  Result Value Ref Range Status   MRSA by PCR Next Gen NOT DETECTED NOT DETECTED Final    Comment: (NOTE) The GeneXpert MRSA Assay (FDA approved for NASAL  specimens only), is one component of a comprehensive MRSA colonization surveillance program. It is not intended to diagnose MRSA infection nor to guide or monitor treatment for MRSA infections. Test performance is not FDA approved in patients less than 19 years old. Performed at [facility], [address] [place],  [place] [address]   Urine Culture     Status: None   Collection Time: 01/22/22  6:01 AM   Specimen: Urine, Random  Result Value Ref Range Status   Specimen Description   Final    URINE, RANDOM Performed at [facility], [address], [place], [place] [address]    Special Requests   Final    NONE Performed at [facility], [address] [place], [place] [address]    Culture   Final    NO GROWTH Performed at [facility], [place] [address], [place], [place] [address]    Report Status 01/23/2022 FINAL  Final     Labs: BNP (last 3 results) No results for input(s): "BNP" in the last 8760 hours. Basic Metabolic Panel: Recent Labs  Lab 01/21/22 1455  NA 135  K 3.6  CL 106  CO2 24  GLUCOSE 93  BUN 39*  CREATININE 0.80  CALCIUM 7.5*   Liver Function Tests: Recent Labs  Lab 01/21/22 1455  AST 19  ALT 13  ALKPHOS 41  BILITOT 0.5  PROT 4.8*  ALBUMIN 2.5*   No results for input(s): "LIPASE", "AMYLASE" in the last 168 hours. No results for input(s): "AMMONIA" in the last 168 hours. CBC: Recent Labs  Lab 01/21/22 1455 01/22/22 0201 01/22/22 1552 01/23/22 1055 01/24/22 0910 01/25/22 1416  WBC 8.6 9.4  --   --  12.3* 9.9  NEUTROABS 6.6  --   --   --   --   --   HGB 4.5* 7.8* 7.4* 8.9* 9.1* 8.7*  HCT 14.2* 23.4* 22.4* 26.7* 27.3* 27.2*  MCV 99.3 90.7  --   --  93.5 96.1  PLT 203 208  --   --  297 313   Cardiac Enzymes: No results for input(s): "CKTOTAL", "CKMB", "CKMBINDEX", "TROPONINI" in the last 168 hours. BNP: Invalid input(s): "POCBNP" CBG: Recent Labs  Lab 01/21/22 1900  GLUCAP 82   D-Dimer No  results for input(s): "DDIMER" in the last 72 hours. Hgb A1c No results for input(s): "HGBA1C" in the last 72 hours. Lipid Profile No results for input(s): "CHOL", "HDL", "LDLCALC", "TRIG", "CHOLHDL", "LDLDIRECT" in the last 72 hours. Thyroid function studies No results for input(s): "TSH", "T4TOTAL", "T3FREE", "THYROIDAB" in the last 72 hours.  Invalid input(s): "FREET3" Anemia work up No results for input(s): "VITAMINB12", "FOLATE", "FERRITIN", "TIBC", "IRON", "RETICCTPCT" in the last 72 hours. Urinalysis    Component Value Date/Time   COLORURINE YELLOW (A) 01/22/2022 0600   APPEARANCEUR CLEAR (A) 01/22/2022 0600   LABSPEC 1.024 01/22/2022 0600   PHURINE 7.0 01/22/2022 0600   GLUCOSEU NEGATIVE 01/22/2022 0600   HGBUR NEGATIVE 01/22/2022 0600   BILIRUBINUR NEGATIVE 01/22/2022 0600   KETONESUR NEGATIVE 01/22/2022 0600   PROTEINUR NEGATIVE 01/22/2022 0600   UROBILINOGEN 0.2 03/06/2007 2115   NITRITE NEGATIVE 01/22/2022 0600   LEUKOCYTESUR NEGATIVE 01/22/2022 0600   Sepsis Labs Recent Labs  Lab 01/21/22 1455 01/22/22 0201 01/24/22 0910 01/25/22 1416  WBC 8.6 9.4 12.3* 9.9   Microbiology Recent Results (from the past 240 hour(s))  Blood Culture (routine x 2)     Status: None   Collection Time: 01/21/22  3:17 PM   Specimen: BLOOD  Result Value Ref Range Status   Specimen Description BLOOD BLOOD LEFT FOREARM  Final   Special Requests   Final    BOTTLES DRAWN AEROBIC AND ANAEROBIC Blood Culture adequate volume   Culture   Final    NO GROWTH 6  DAYS Performed at [facility], [address] [place], [place] [address]    Report Status 01/27/2022 FINAL  Final  Blood Culture (routine x 2)     Status: None   Collection Time: 01/21/22  3:17 PM   Specimen: BLOOD  Result Value Ref Range Status   Specimen Description BLOOD BLOOD LEFT HAND  Final   Special Requests   Final    BOTTLES DRAWN AEROBIC AND ANAEROBIC Blood Culture results may not be optimal due to an  excessive volume of blood received in culture bottles   Culture   Final    NO GROWTH 6 DAYS Performed at [facility], [address], [place], [place] [address]    Report Status 01/27/2022 FINAL  Final  MRSA Next Gen by PCR, Nasal     Status: None   Collection Time: 01/21/22  8:26 PM   Specimen: Nasal Mucosa; Nasal Swab  Result Value Ref Range Status   MRSA by PCR Next Gen NOT DETECTED NOT DETECTED Final    Comment: (NOTE) The GeneXpert MRSA Assay (FDA approved for NASAL specimens only), is one component of a comprehensive MRSA colonization surveillance program. It is not intended to diagnose MRSA infection nor to guide or monitor treatment for MRSA infections. Test performance is not FDA approved in patients less than 59 years old. Performed at [facility], [address], [place], [place] [address]   Urine Culture     Status: None   Collection Time: 01/22/22  6:01 AM   Specimen: Urine, Random  Result Value Ref Range Status   Specimen Description   Final    URINE, RANDOM Performed at [facility], [address], [place], [place] [address]    Special Requests   Final    NONE Performed at [facility], [address] [place], [place] [address]    Culture   Final    NO GROWTH Performed at [facility], [place] [address] [place], [place] [address]    Report Status 01/23/2022 FINAL  Final   Imaging PERIPHERAL VASCULAR CATHETERIZATION  Result Date: 01/22/2022 See surgical note for result.  CT Head Wo Contrast  Result Date: 01/21/2022 CLINICAL DATA:  Mental status change, unknown cause EXAM: CT HEAD WITHOUT CONTRAST TECHNIQUE: Contiguous axial images were obtained from the base of the skull through the vertex without intravenous contrast. RADIATION DOSE REDUCTION: This exam was performed according to the departmental dose-optimization program which includes automated exposure control, adjustment of the mA and/or kV according to  patient size and/or use of iterative reconstruction technique. COMPARISON:  CT head May 7, 23. FINDINGS: Brain: No evidence of acute large vascular territory infarction, hemorrhage, hydrocephalus, extra-axial collection or mass lesion/mass effect. Patchy white matter hypodensities, nonspecific but compatible with chronic microvascular ischemic disease. Vascular: No hyperdense vessel identified. Skull: No acute fracture. Sinuses/Orbits: Largely clear sinuses.  No acute orbital findings. Other: No mastoid effusions. IMPRESSION: No evidence of acute intracranial abnormality. Electronically Signed   By: Margaretha [place] M.D.   On: 01/21/2022 16:14   CT Abdomen Pelvis W Contrast  Result Date: 01/21/2022 CLINICAL DATA:  Abdominal pain, hypotension, melena, guaiac-positive stool and severe anemia. EXAM: CT ABDOMEN AND PELVIS WITH CONTRAST TECHNIQUE: Multidetector CT imaging of the abdomen and pelvis was performed using the standard protocol following bolus administration of intravenous contrast. RADIATION DOSE REDUCTION: This exam was performed according to the departmental dose-optimization program which includes automated exposure control, adjustment of the mA and/or kV according  to patient size and/or use of iterative reconstruction technique. CONTRAST:  14m OMNIPAQUE IOHEXOL 300 MG/ML  SOLN COMPARISON:  Prior CT of the abdomen and pelvis on 10/11/2021 FINDINGS: Lower chest: No acute abnormality. Hepatobiliary: Stable mild biliary dilatation status post prior cholecystectomy. No evidence by CT to suggest choledocholithiasis. The liver is unremarkable. Pancreas: Unremarkable. No pancreatic ductal dilatation or surrounding inflammatory changes. Spleen: Normal in size without focal abnormality. Adrenals/Urinary Tract: Adrenal glands are unremarkable. Kidneys are normal, without renal calculi, focal lesion, or hydronephrosis. Bladder is unremarkable. Stomach/Bowel: The gastric antrum may be mildly thickened. However,  the stomach is nondistended and difficult to evaluate. Bowel shows no evidence of obstruction, ileus, inflammation or lesion. No visible ulcers. The appendix is not visualized. Diverticulosis of the sigmoid colon without evidence of acute diverticulitis. No free intraperitoneal air. Vascular/Lymphatic: Atherosclerosis of the abdominal aorta and iliac arteries without aneurysms. Reproductive: Uterus and bilateral adnexa are unremarkable. Other: No abdominal wall hernia or abnormality. No abdominopelvic ascites. Musculoskeletal: No acute or significant osseous findings. IMPRESSION: 1. Possible mild thickening of the gastric antrum. No overt ulcers are identified by CT. 2. Stable mild biliary dilatation status post prior cholecystectomy. Electronically Signed   By: GAletta EdouardM.D.   On: 01/21/2022 16:04   DG Chest Port 1 View  Result Date: 01/21/2022 CLINICAL DATA:  Sepsis. EXAM: PORTABLE CHEST 1 VIEW COMPARISON:  Chest XR, 10/11/2021.  CT chest, 06/09/2021. FINDINGS: Cardiomediastinal silhouette is within normal limits. Lungs are hyperinflated with flattening of the diaphragm. No focal consolidation or mass. No pleural effusion or pneumothorax. Cholecystectomy clips. No acute displaced fracture. IMPRESSION: Obstructive lung findings without acute superimposed cardiopulmonary process. Electronically Signed   By: JMichaelle BirksM.D.   On: 01/21/2022 15:26      Time coordinating discharge: Over 30 minutes  SIGNED:  NEmeterio ReeveDO [facility]

## 2022-01-27 NOTE — TOC Progression Note (Signed)
Transition of Care [facility]) - Progression Note    Patient Details  Name: [name] MRN: [id number] Date of Birth: 12/10/1940  Transition [facility]) CM/SW [place], RN Phone Number: 01/27/2022, 11:05 AM  Clinical Narrative:   Patient will discharge to room 503 at [facility] today per Magda Paganini, patient will go by ems patient and brother aware.    Expected Discharge Plan: [place] Barriers to Discharge: Continued Medical Work up  Expected Discharge Plan and Services Expected Discharge Plan: [place] Choice: [place] arrangements for the past 2 months: Single Family Home Expected Discharge Date: 01/27/22                                     Social Determinants of Health (SDOH) Interventions    Readmission Risk Interventions     No data to display

## 2022-01-27 NOTE — Progress Notes (Signed)
Pt discharging to [facility]. Report called to [facility]. EMS will be transporting patient to facility. Patient and family made aware.

## 2022-01-29 ENCOUNTER — Encounter: Payer: Self-pay | Admitting: Oncology

## 2022-02-18 ENCOUNTER — Other Ambulatory Visit: Payer: Self-pay | Admitting: Internal Medicine

## 2022-02-18 DIAGNOSIS — Z1231 Encounter for screening mammogram for malignant neoplasm of breast: Secondary | ICD-10-CM

## 2022-03-25 ENCOUNTER — Inpatient Hospital Stay: Payer: Medicare Other | Attending: Oncology

## 2022-03-25 DIAGNOSIS — Z79899 Other long term (current) drug therapy: Secondary | ICD-10-CM | POA: Diagnosis not present

## 2022-03-25 DIAGNOSIS — D509 Iron deficiency anemia, unspecified: Secondary | ICD-10-CM | POA: Insufficient documentation

## 2022-03-25 LAB — CBC WITH DIFFERENTIAL/PLATELET
Abs Immature Granulocytes: 0.02 10*3/uL (ref 0.00–0.07)
Basophils Absolute: 0 10*3/uL (ref 0.0–0.1)
Basophils Relative: 0 %
Eosinophils Absolute: 0.1 10*3/uL (ref 0.0–0.5)
Eosinophils Relative: 1 %
HCT: 24.9 % — ABNORMAL LOW (ref 36.0–46.0)
Hemoglobin: 8 g/dL — ABNORMAL LOW (ref 12.0–15.0)
Immature Granulocytes: 0 %
Lymphocytes Relative: 16 %
Lymphs Abs: 0.9 10*3/uL (ref 0.7–4.0)
MCH: 29.3 pg (ref 26.0–34.0)
MCHC: 32.1 g/dL (ref 30.0–36.0)
MCV: 91.2 fL (ref 80.0–100.0)
Monocytes Absolute: 0.6 10*3/uL (ref 0.1–1.0)
Monocytes Relative: 11 %
Neutro Abs: 4 10*3/uL (ref 1.7–7.7)
Neutrophils Relative %: 72 %
Platelets: 268 10*3/uL (ref 150–400)
RBC: 2.73 MIL/uL — ABNORMAL LOW (ref 3.87–5.11)
RDW: 14.6 % (ref 11.5–15.5)
WBC: 5.6 10*3/uL (ref 4.0–10.5)
nRBC: 0 % (ref 0.0–0.2)

## 2022-03-25 LAB — IRON AND TIBC
Iron: 52 ug/dL (ref 28–170)
Saturation Ratios: 21 % (ref 10.4–31.8)
TIBC: 253 ug/dL (ref 250–450)
UIBC: 201 ug/dL

## 2022-03-25 LAB — VITAMIN B12: Vitamin B-12: 603 pg/mL (ref 180–914)

## 2022-03-25 LAB — FERRITIN: Ferritin: 132 ng/mL (ref 11–307)

## 2022-03-30 ENCOUNTER — Ambulatory Visit
Admission: RE | Admit: 2022-03-30 | Discharge: 2022-03-30 | Disposition: A | Discharge status: Home / Self Care | Payer: Medicare Other | Source: Ambulatory Visit | Attending: Internal Medicine | Admitting: Internal Medicine

## 2022-03-30 DIAGNOSIS — Z1231 Encounter for screening mammogram for malignant neoplasm of breast: Secondary | ICD-10-CM | POA: Insufficient documentation

## 2022-04-02 ENCOUNTER — Other Ambulatory Visit: Payer: Self-pay

## 2022-04-02 ENCOUNTER — Emergency Department: Payer: Medicare Other

## 2022-04-02 ENCOUNTER — Inpatient Hospital Stay
Admission: EM | Admit: 2022-04-02 | Discharge: 2022-04-04 | DRG: 392 | Disposition: A | Discharge status: Home / Self Care | Payer: Medicare Other | Attending: Internal Medicine | Admitting: Internal Medicine

## 2022-04-02 DIAGNOSIS — K219 Gastro-esophageal reflux disease without esophagitis: Secondary | ICD-10-CM | POA: Diagnosis present

## 2022-04-02 DIAGNOSIS — D62 Acute posthemorrhagic anemia: Secondary | ICD-10-CM | POA: Diagnosis present

## 2022-04-02 DIAGNOSIS — G894 Chronic pain syndrome: Secondary | ICD-10-CM | POA: Diagnosis present

## 2022-04-02 DIAGNOSIS — Z881 Allergy status to other antibiotic agents status: Secondary | ICD-10-CM

## 2022-04-02 DIAGNOSIS — Z86010 Personal history of colonic polyps: Secondary | ICD-10-CM

## 2022-04-02 DIAGNOSIS — R1010 Upper abdominal pain, unspecified: Secondary | ICD-10-CM | POA: Diagnosis not present

## 2022-04-02 DIAGNOSIS — J449 Chronic obstructive pulmonary disease, unspecified: Secondary | ICD-10-CM | POA: Diagnosis present

## 2022-04-02 DIAGNOSIS — R112 Nausea with vomiting, unspecified: Secondary | ICD-10-CM | POA: Diagnosis present

## 2022-04-02 DIAGNOSIS — M069 Rheumatoid arthritis, unspecified: Secondary | ICD-10-CM | POA: Diagnosis present

## 2022-04-02 DIAGNOSIS — Z91148 Patient's other noncompliance with medication regimen for other reason: Secondary | ICD-10-CM

## 2022-04-02 DIAGNOSIS — N6019 Diffuse cystic mastopathy of unspecified breast: Secondary | ICD-10-CM | POA: Diagnosis present

## 2022-04-02 DIAGNOSIS — F17210 Nicotine dependence, cigarettes, uncomplicated: Secondary | ICD-10-CM | POA: Diagnosis present

## 2022-04-02 DIAGNOSIS — R109 Unspecified abdominal pain: Secondary | ICD-10-CM | POA: Diagnosis not present

## 2022-04-02 DIAGNOSIS — Z882 Allergy status to sulfonamides status: Secondary | ICD-10-CM

## 2022-04-02 DIAGNOSIS — K2970 Gastritis, unspecified, without bleeding: Secondary | ICD-10-CM | POA: Diagnosis not present

## 2022-04-02 DIAGNOSIS — Z720 Tobacco use: Secondary | ICD-10-CM | POA: Diagnosis present

## 2022-04-02 DIAGNOSIS — Z79899 Other long term (current) drug therapy: Secondary | ICD-10-CM

## 2022-04-02 DIAGNOSIS — F419 Anxiety disorder, unspecified: Secondary | ICD-10-CM | POA: Diagnosis present

## 2022-04-02 DIAGNOSIS — R1114 Bilious vomiting: Secondary | ICD-10-CM

## 2022-04-02 DIAGNOSIS — E039 Hypothyroidism, unspecified: Secondary | ICD-10-CM | POA: Diagnosis present

## 2022-04-02 DIAGNOSIS — Z79890 Hormone replacement therapy (postmenopausal): Secondary | ICD-10-CM

## 2022-04-02 DIAGNOSIS — K922 Gastrointestinal hemorrhage, unspecified: Secondary | ICD-10-CM | POA: Diagnosis present

## 2022-04-02 DIAGNOSIS — Z885 Allergy status to narcotic agent status: Secondary | ICD-10-CM

## 2022-04-02 DIAGNOSIS — K529 Noninfective gastroenteritis and colitis, unspecified: Secondary | ICD-10-CM | POA: Diagnosis present

## 2022-04-02 DIAGNOSIS — M8580 Other specified disorders of bone density and structure, unspecified site: Secondary | ICD-10-CM | POA: Diagnosis present

## 2022-04-02 DIAGNOSIS — Z888 Allergy status to other drugs, medicaments and biological substances status: Secondary | ICD-10-CM

## 2022-04-02 DIAGNOSIS — Z79631 Long term (current) use of antimetabolite agent: Secondary | ICD-10-CM

## 2022-04-02 LAB — COMPREHENSIVE METABOLIC PANEL
ALT: 12 U/L (ref 0–44)
AST: 21 U/L (ref 15–41)
Albumin: 3.4 g/dL — ABNORMAL LOW (ref 3.5–5.0)
Alkaline Phosphatase: 79 U/L (ref 38–126)
Anion gap: 8 (ref 5–15)
BUN: 22 mg/dL (ref 8–23)
CO2: 24 mmol/L (ref 22–32)
Calcium: 8 mg/dL — ABNORMAL LOW (ref 8.9–10.3)
Chloride: 102 mmol/L (ref 98–111)
Creatinine, Ser: 0.89 mg/dL (ref 0.44–1.00)
GFR, Estimated: >60 mL/min (ref >60)
Glucose, Bld: 110 mg/dL — ABNORMAL HIGH (ref 70–99)
Potassium: 3.8 mmol/L (ref 3.5–5.1)
Sodium: 134 mmol/L — ABNORMAL LOW (ref 135–145)
Total Bilirubin: 0.6 mg/dL (ref 0.3–1.2)
Total Protein: 7.1 g/dL (ref 6.5–8.1)

## 2022-04-02 LAB — CBC
HCT: 24.7 % — ABNORMAL LOW (ref 36.0–46.0)
Hemoglobin: 7.6 g/dL — ABNORMAL LOW (ref 12.0–15.0)
MCH: 29.2 pg (ref 26.0–34.0)
MCHC: 30.8 g/dL (ref 30.0–36.0)
MCV: 95 fL (ref 80.0–100.0)
Platelets: 429 10*3/uL — ABNORMAL HIGH (ref 150–400)
RBC: 2.6 MIL/uL — ABNORMAL LOW (ref 3.87–5.11)
RDW: 17.4 % — ABNORMAL HIGH (ref 11.5–15.5)
WBC: 8 10*3/uL (ref 4.0–10.5)
nRBC: 0 % (ref 0.0–0.2)

## 2022-04-02 LAB — URINALYSIS, ROUTINE W REFLEX MICROSCOPIC
Bilirubin Urine: NEGATIVE
Glucose, UA: NEGATIVE mg/dL
Hgb urine dipstick: NEGATIVE
Ketones, ur: NEGATIVE mg/dL
Leukocytes,Ua: NEGATIVE
Nitrite: NEGATIVE
Protein, ur: NEGATIVE mg/dL
Specific Gravity, Urine: 1.014 (ref 1.005–1.030)
pH: 7 (ref 5.0–8.0)

## 2022-04-02 LAB — LACTIC ACID, PLASMA: Lactic Acid, Venous: 1 mmol/L (ref 0.5–1.9)

## 2022-04-02 LAB — PROTIME-INR
INR: 1.2 (ref 0.8–1.2)
Prothrombin Time: 15.2 seconds (ref 11.4–15.2)

## 2022-04-02 LAB — APTT: aPTT: 29 seconds (ref 24–36)

## 2022-04-02 LAB — LIPASE, BLOOD: Lipase: 38 U/L (ref 11–51)

## 2022-04-02 MED ORDER — ACETAMINOPHEN 10 MG/ML IV SOLN
650.0000 mg | Freq: Four times a day (QID) | INTRAVENOUS | Status: AC
  Administered 2022-04-03 (×3): 650 mg via INTRAVENOUS
  Administered 2022-04-03: 65 mg via INTRAVENOUS
  Filled 2022-04-02 (×5): qty 65

## 2022-04-02 MED ORDER — SODIUM CHLORIDE 0.9 % IV SOLN: 10.0000 mL/h | Freq: Once | INTRAVENOUS | Status: AC

## 2022-04-02 MED ORDER — IOHEXOL 350 MG/ML SOLN
100.0000 mL | Freq: Once | INTRAVENOUS | Status: AC | PRN
  Administered 2022-04-02: 100 mL via INTRAVENOUS

## 2022-04-02 MED ORDER — SODIUM CHLORIDE 0.9 % IV BOLUS
500.0000 mL | Freq: Once | INTRAVENOUS | Status: AC
  Administered 2022-04-02: 500 mL via INTRAVENOUS

## 2022-04-02 MED ORDER — PANTOPRAZOLE SODIUM 40 MG IV SOLR
40.0000 mg | Freq: Once | INTRAVENOUS | Status: AC
  Administered 2022-04-02: 40 mg via INTRAVENOUS
  Filled 2022-04-02: qty 10

## 2022-04-02 MED ORDER — ONDANSETRON HCL 4 MG/2ML IJ SOLN
4.0000 mg | INTRAMUSCULAR | Status: AC
  Administered 2022-04-02: 4 mg via INTRAVENOUS
  Filled 2022-04-02: qty 2

## 2022-04-02 NOTE — Assessment & Plan Note (Addendum)
Patient found to have anemia with a hemoglobin of 7.6 suspect secondary to acute blood loss anemia due to GI bleed.    Latest Ref Rng & Units 04/02/2022    6:48 PM 03/25/2022    1:27 PM 01/25/2022    2:16 PM  CBC  WBC 4.0 - 10.5 K/uL 8.0  5.6  9.9   Hemoglobin 12.0 - 15.0 g/dL 7.6  8.0  8.7   Hematocrit 36.0 - 46.0 % 24.7  24.9  27.2   Platelets 150 - 400 K/uL 429  268  313     GI consult per ED provider. IV PPI therapy.

## 2022-04-02 NOTE — ED Provider Notes (Signed)
[facility] Provider Note    Event Date/Time   First MD Initiated Contact with Patient 04/02/22 2050     (approximate)   History   Abdominal Pain   HPI  [name] is a 81 y.o. female with a history of previous upper GI bleeding with previous endoscopy and vascular embolization according to discharge summary from August  Patient presents today, at about lunchtime began having nausea pain in her mid to upper abdomen, also noting dark stool and vomiting somewhat dark to green looking vomitus.  More dry heaving than true vomiting.  Reports a moderate pain in the abdomen.  No chest pain no trouble breathing no fevers.  She does not take any blood thinners, and has stopped taking any pain medications or NSAIDs.  She reports a history of bleeding and previous blood transfusion.     Physical Exam   Triage Vital Signs: ED Triage Vitals  Enc Vitals Group     BP 04/02/22 1845 (!) 144/92     Pulse Rate 04/02/22 1845 91     Resp 04/02/22 1845 18     Temp 04/02/22 1845 97.8 F (36.6 C)     Temp Source 04/02/22 1845 Oral     SpO2 04/02/22 1845 99 %     Weight 04/02/22 1846 100 lb (45.4 kg)     Height --      Head Circumference --      Peak Flow --      Pain Score 04/02/22 1846 8     Pain Loc --      Pain Edu? --      Excl. in No Name? --     Most recent vital signs: Vitals:   04/02/22 2300 04/02/22 2305  BP: 136/79 123/85  Pulse: 88 92  Resp:  20  Temp:  98 F (36.7 C)  SpO2: 99% 99%     General: Awake, no distress.  She is however occasionally dry heaving, and has small amount of what appears to be dark to bilious appearing emesis in bag. CV:  Good peripheral perfusion.  Normal tones and rate Resp:  Normal effort.  Clear bilateral with normal work of breathing Abd:  No distention.  Soft, nondistended but reports moderate pain to palpation throughout most quadrants but especially in the mid and epigastric region.  There is no rebound  guarding or peritonitis Other:  Extremities appear warm and well-perfused.  Skin tone slightly pale Rectal exam performed with nurse Elmo Putt.  Dark to slightly green stool, heme positive  ED Results / Procedures / Treatments   Labs (all labs ordered are listed, but only abnormal results are displayed) Labs Reviewed  COMPREHENSIVE METABOLIC PANEL - Abnormal; Notable for the following components:      Result Value   Sodium 134 (*)    Glucose, Bld 110 (*)    Calcium 8.0 (*)    Albumin 3.4 (*)    All other components within normal limits  CBC - Abnormal; Notable for the following components:   RBC 2.60 (*)    Hemoglobin 7.6 (*)    HCT 24.7 (*)    RDW 17.4 (*)    Platelets 429 (*)    All other components within normal limits  URINALYSIS, ROUTINE W REFLEX MICROSCOPIC - Abnormal; Notable for the following components:   Color, Urine YELLOW (*)    APPearance HAZY (*)    All other components within normal limits  LIPASE, BLOOD  LACTIC ACID, PLASMA  PROTIME-INR  APTT  PREPARE RBC (CROSSMATCH)  TYPE AND SCREEN   Labs interpreted as notable for hemoglobin of 7.6.  This appears to be a reduction from what appears a typical baseline of between 8 and 9.  EKG  And interpreted by me at 2125 heart rate 90 QRS 90 QTc 465 Normal sinus rhythm, no evidence of acute ischemia.  Mild nonspecific T wave abnormality noted in lateral precordial leads   RADIOLOGY  CT ANGIO GI BLEED  Result Date: 04/02/2022 CLINICAL DATA:  Abdominal pain, nausea, lower GI bleed EXAM: CTA ABDOMEN AND PELVIS WITHOUT AND WITH CONTRAST TECHNIQUE: Multidetector CT imaging of the abdomen and pelvis was performed using the standard protocol during bolus administration of intravenous contrast. Multiplanar reconstructed images and MIPs were obtained and reviewed to evaluate the vascular anatomy. RADIATION DOSE REDUCTION: This exam was performed according to the departmental dose-optimization program which includes automated  exposure control, adjustment of the mA and/or kV according to patient size and/or use of iterative reconstruction technique. CONTRAST:  150m OMNIPAQUE IOHEXOL 350 MG/ML SOLN COMPARISON:  CT abdomen/pelvis dated 01/21/2022 FINDINGS: Lower chest: Mild branching scarring in the right lower lobe, likely post infectious inflammatory. Hepatobiliary: Liver is within normal limits. Status post cholecystectomy. No intrahepatic ductal dilatation. Mild dilatation of the common duct, likely postsurgical. Pancreas: Within normal limits. Spleen: Within normal limits. Adrenals/Urinary Tract: Adrenal glands are within normal limits. 12 mm lateral left lower pole renal cyst (series 17/image 70), benign (Bosniak I). No follow-up is recommended. Right kidney is within normal limits. No hydronephrosis. Bladder is within normal limits. Stomach/Bowel: Stomach is within normal limits. No evidence of bowel obstruction. Appendix is not discretely visualized. Extensive sigmoid diverticulosis, without evidence of diverticulitis. No finding to suggest active GI bleeding. However, there is long segment mucosal hyperenhancement involving the left colon on the portal venous phase, most pronounced at the splenic flexure and proximal descending colon. This appearance is nonspecific but at least raises the possibility of infectious/inflammatory colitis. Vascular/Lymphatic: No evidence of abdominal aortic aneurysm. Atherosclerotic calcifications of the abdominal aorta and branch vessels. Celiac artery, SMA, and IMA are patent. No suspicious abdominopelvic lymphadenopathy. Reproductive: Uterus is within normal limits. No adnexal masses. Other: No abdominopelvic ascites. Musculoskeletal: Mild degenerative changes of the lumbar spine. Status post ORIF of the right hip. IMPRESSION: No finding to suggest active GI bleeding. Possible infectious inflammatory colitis involving the left colon, although equivocal. Extensive sigmoid diverticulosis, without  evidence of diverticulitis. Additional ancillary findings as above. Electronically Signed   By: SJulian HyM.D.   On: 04/02/2022 22:31       PROCEDURES:  Critical Care performed: Yes, see critical care procedure note(s)  CRITICAL CARE Performed by: MDelman Kitten  Total critical care time: 30 minutes  Critical care time was exclusive of separately billable procedures and treating other patients.  Critical care was necessary to treat or prevent imminent or life-threatening deterioration.  Critical care was time spent personally by me on the following activities: development of treatment plan with patient and/or surrogate as well as nursing, discussions with consultants, evaluation of patient's response to treatment, examination of patient, obtaining history from patient or surrogate, ordering and performing treatments and interventions, ordering and review of laboratory studies, ordering and review of radiographic studies, pulse oximetry and re-evaluation of patient's condition.   Procedures   MEDICATIONS ORDERED IN ED: Medications  acetaminophen (OFIRMEV) IV 650 mg (has no administration in time range)  0.9 %  sodium chloride infusion (0 mL/hr Intravenous Hold  04/02/22 2135)  ondansetron (ZOFRAN) injection 4 mg (4 mg Intravenous Given 04/02/22 2135)  sodium chloride 0.9 % bolus 500 mL (0 mLs Intravenous Stopped 04/02/22 2212)  pantoprazole (PROTONIX) injection 40 mg (40 mg Intravenous Given 04/02/22 2135)  iohexol (OMNIPAQUE) 350 MG/ML injection 100 mL (100 mLs Intravenous Contrast Given 04/02/22 2156)     IMPRESSION / MDM / ASSESSMENT AND PLAN / ED COURSE  I reviewed the triage vital signs and the nursing notes.                              Differential diagnosis includes but is not limited to, abdominal perforation, aortic dissection, cholecystitis, appendicitis, diverticulitis, colitis, esophagitis/gastritis, kidney stone, pyelonephritis, urinary tract infection,  aortic aneurysm. All are considered in decision and treatment plan. Based upon the patient's presentation and risk factors, proceed with imaging including bleeding scan.  Patient seems to be at notable risk for GI bleeding history of similar, and has dark bloody stool today.  Also some components and imaging suggestive of possible infection or other cause of colitis, will treat for potential active GI bleeding then consider possibility of other causes of colitis as well.  No clear cause to suggest acute bacterial colitis that would require antibiotic therapy.  Patient receiving 1 unit of red blood cells for anemia with active bleeding, will be admitted to the hospital and gastroenterology to provide full more formal consult in the morning   The patient is on the cardiac monitor to evaluate for evidence of arrhythmia and/or significant heart rate changes.  Consulted with her hospitalist Dr. Girard Cooter who is excepting of admission  Clinical Course as of 04/02/22 2340  Fri Apr 02, 2022  2301 Consulted with the hospitalist, patient accepted to hospital service by Dr. Girard Cooter [MQ]    Clinical Course User Index [MQ] Delman Kitten, MD   Patient consented to blood products.  I ordered 2 units of typed specific blood.  Her vital signs and mental status are normal and reassuring, but her hemoglobin of less than 8 with active GI bleeding and her previous history of need for embolization are quite concerning.  At this juncture I suspect acute upper GI bleeding, but further work-up is required.  At 9:15 PM, I discussed case with our on-call GI Dr. Allen Norris who advises proceeding with GI bleeding CT scan which has been ordered, he is agreeable with the current treatments in place and will be following up on results of bleeding study.  Patient is clearly understanding of the need for admission, but further work-up is ongoing at this time.  FINAL CLINICAL IMPRESSION(S) / ED DIAGNOSES   Final diagnoses:  Bilious  vomiting with nausea  Acute GI bleeding  Colitis  Anemia associated with acute blood loss     Rx / DC Orders   ED Discharge Orders     None        Note:  This document was prepared using Dragon voice recognition software and may include unintentional dictation errors.   Delman Kitten, MD 04/02/22 2340

## 2022-04-02 NOTE — Assessment & Plan Note (Signed)
Could be due to gastritis seen on EGD.  Continue PPI

## 2022-04-02 NOTE — ED Notes (Signed)
Pt back from CT

## 2022-04-02 NOTE — ED Notes (Signed)
Pts belongings clothing placed in a belongings bad; Denture cup provided to the patient.

## 2022-04-02 NOTE — ED Notes (Signed)
Pt to CT

## 2022-04-02 NOTE — ED Notes (Signed)
Pt ok'd to have ice chips per Dr. Jacqualine Code.

## 2022-04-02 NOTE — H&P (Incomplete)
History and Physical    Chief Complaint: Abdominal pain   HISTORY OF PRESENT ILLNESS: [name] is an 81 y.o. female seen in the emergency room with complaints of abdominal pain that started about lunchtime along with nausea pain was in the upper abdomen and epigastric area.  Patient also has intermittent dark stools.  No associated symptoms of fevers chills gross bleeding headaches blurred vision speech or gait issues. Patient does not take any NSAIDs, was admitted to the hospital in August for weakness and hypotension found to have acute blood loss anemia with a hemoglobin of 4.5, requiring 2 units of PRBC status post endoscopy and embolization of the gastroduodenal artery by vascular on 01/22/2022.   Pt has PMH as below: Past Medical History:  Diagnosis Date  . Abdominal pain   . Anemia   . Anxiety   . Arthritis, rheumatoid ([place])   . Breast pain   . Chest discomfort   . Chest pain   . Chronic pain syndrome   . Collagen vascular disease (HCC)    hx rheumatoid arthritis.   . Constipation   . COPD (chronic obstructive pulmonary disease) ([place])   . Diverticulosis   . Fibrocystic breast disease   . Gastritis   . GERD (gastroesophageal reflux disease)   . Hemorrhoids   . Hx of colonic polyps   . Hypothyroidism   . Osteopenia   . PONV (postoperative nausea and vomiting)   . Renal artery stenosis ([place])   . Right ankle pain   . Thyroid binding globulin deficiency   . Tobacco abuse    Review of Systems  Gastrointestinal:  Positive for abdominal pain and nausea.       Melena    Allergies  Allergen Reactions  . Other Other (See Comments)    Uncoded Allergy. Allergen: symmetral Uncoded Allergy. Allergen: etrafon Uncoded Allergy. Allergen: desbutal Uncoded Allergy. Allergen: symmetral Uncoded Allergy. Allergen: etrafon Uncoded Allergy. Allergen: desbutal Other reaction(s): Unknown Uncoded Allergy. Allergen: symmetral Other reaction(s): Unknown Uncoded Allergy.  Allergen: etrafon Uncoded Allergy. Allergen: symmetral Uncoded Allergy. Allergen: etrafon Uncoded Allergy. Allergen: desbutal   . Sulfa Antibiotics     Other reaction(s): Unknown Uncoded Allergy. Allergen: desbutal  . Denosumab Itching  . Etrafon [Perphenazine-Amitriptyline]   . Hydrocodone Nausea And Vomiting  . Mirtazapine     Other reaction(s): Hallucination  . Doxycycline Nausea Only  . Percocet [Oxycodone-Acetaminophen] Nausea And Vomiting  . Sertraline Palpitations   Past Surgical History:  Procedure Laterality Date  . CHOLECYSTECTOMY    . EMBOLIZATION N/A 01/22/2022   Procedure: EMBOLIZATION;  Surgeon: Katha Cabal, MD;  Location: [facility];  Service: Cardiovascular;  Laterality: N/A;  . ESOPHAGOGASTRODUODENOSCOPY (EGD) WITH PROPOFOL N/A 01/22/2022   Procedure: ESOPHAGOGASTRODUODENOSCOPY (EGD) WITH PROPOFOL;  Surgeon: Lucilla Lame, MD;  Location: [facility];  Service: Endoscopy;  Laterality: N/A;  . JOINT REPLACEMENT     right hip  . LEG SURGERY    . TONSILLECTOMY      Social History   Socioeconomic History  . Marital status: Single    Spouse name: Not on file  . Number of children: Not on file  . Years of education: Not on file  . Highest education level: Not on file  Occupational History  . Not on file  Tobacco Use  . Smoking status: Some Days    Packs/day: 0.10    Types: Cigarettes  . Smokeless tobacco: Never  . Tobacco comments:    Pt maybe has 1 cig a  month  Vaping Use  . Vaping Use: Never used  Substance and Sexual Activity  . Alcohol use: No  . Drug use: No  . Sexual activity: Not Currently  Other Topics Concern  . Not on file  Social History Narrative  . Not on file   Social Determinants of Health   Financial Resource Strain: Not on file  Food Insecurity: Not on file  Transportation Needs: Not on file  Physical Activity: Not on file  Stress: Not on file  Social Connections: Not on file      CURRENT  MEDS:   Current Outpatient Medications (Endocrine & Metabolic):  .  levothyroxine (SYNTHROID) 50 MCG tablet, Take 50 mcg by mouth daily.    Current Outpatient Medications (Cardiovascular):  .  atorvastatin (LIPITOR) 10 MG tablet, Take 10 mg by mouth daily.    Current Facility-Administered Medications (Analgesics):  .  acetaminophen (OFIRMEV) IV 650 mg    Current Outpatient Medications (Hematological):  .  folic acid (FOLVITE) 1 MG tablet, Take 1 tablet by mouth daily.  Current Facility-Administered Medications (Other):  .  0.9 %  sodium chloride infusion  Current Outpatient Medications (Other):  Marland Kitchen  buPROPion (WELLBUTRIN XL) 150 MG 24 hr tablet, Take 150 mg by mouth daily. Marland Kitchen  dicyclomine (BENTYL) 10 MG capsule, Take 10 mg by mouth 2 (two) times daily as needed. .  DULoxetine (CYMBALTA) 60 MG capsule, Take 60 mg by mouth daily. Marland Kitchen  galantamine (RAZADYNE) 8 MG tablet, Take 8 mg by mouth daily. Marland Kitchen  lidocaine (LIDODERM) 5 %, Place 1 patch onto the skin daily. Remove & Discard patch within 12 hours or as directed by MD .  methotrexate 250 MG/10ML injection, ADMINISTER 0.8 ML ONCE A WEEK .  nicotine (NICODERM CQ - DOSED IN MG/24 HOURS) 14 mg/24hr patch, Place 1 patch (14 mg total) onto the skin daily. .  ondansetron (ZOFRAN-ODT) 4 MG disintegrating tablet, Take 1 tablet (4 mg total) by mouth every 8 (eight) hours as needed for nausea or vomiting. (Patient not taking: Reported on 01/21/2022) .  pantoprazole (PROTONIX) 40 MG tablet, Take 1 tablet (40 mg total) by mouth 2 (two) times daily before a meal. .  senna-docusate (SENOKOT-S) 8.6-50 MG tablet, Take 2 tablets by mouth 2 (two) times daily as needed for mild constipation or moderate constipation. Marland Kitchen  tiZANidine (ZANAFLEX) 2 MG tablet, Take 2 mg by mouth 2 (two) times daily.    ED Course: Pt in Ed alert awake oriented afebrile blood pressure stable. Vitals:   04/02/22 2250 04/02/22 2300 04/02/22 2305 04/02/22 2330  BP: 133/74 136/79  123/85 132/79  Pulse: 93 88 92 88  Resp: '20  20 18  '$ Temp: 97.7 F (36.5 C)  98 F (36.7 C)   TempSrc: Oral  Oral   SpO2:  99% 99% 100%  Weight:       No intake/output data recorded. SpO2: 100 % Blood work in ed shows: Anemia of seven-point Results for orders placed or performed during the hospital encounter of 04/02/22 (from the past 48 hour(s))  Urinalysis, Routine w reflex microscopic     Status: Abnormal   Collection Time: 04/02/22  6:47 PM  Result Value Ref Range   Color, Urine YELLOW (A) YELLOW   APPearance HAZY (A) CLEAR   Specific Gravity, Urine 1.014 1.005 - 1.030   pH 7.0 5.0 - 8.0   Glucose, UA NEGATIVE NEGATIVE mg/dL   Hgb urine dipstick NEGATIVE NEGATIVE   Bilirubin Urine NEGATIVE NEGATIVE   Ketones,  ur NEGATIVE NEGATIVE mg/dL   Protein, ur NEGATIVE NEGATIVE mg/dL   Nitrite NEGATIVE NEGATIVE   Leukocytes,Ua NEGATIVE NEGATIVE    Comment: Performed at [facility], [address] [place], [place] [address]  Lipase, blood     Status: None   Collection Time: 04/02/22  6:48 PM  Result Value Ref Range   Lipase 38 11 - 51 U/L    Comment: Performed at [facility], [address] [place], [place] [address]  Comprehensive metabolic panel     Status: Abnormal   Collection Time: 04/02/22  6:48 PM  Result Value Ref Range   Sodium 134 (L) 135 - 145 mmol/L   Potassium 3.8 3.5 - 5.1 mmol/L   Chloride 102 98 - 111 mmol/L   CO2 24 22 - 32 mmol/L   Glucose, Bld 110 (H) 70 - 99 mg/dL    Comment: Glucose reference range applies only to samples taken after fasting for at least 8 hours.   BUN 22 8 - 23 mg/dL   Creatinine, Ser 0.89 0.44 - 1.00 mg/dL   Calcium 8.0 (L) 8.9 - 10.3 mg/dL   Total Protein 7.1 6.5 - 8.1 g/dL   Albumin 3.4 (L) 3.5 - 5.0 g/dL   AST 21 15 - 41 U/L   ALT 12 0 - 44 U/L   Alkaline Phosphatase 79 38 - 126 U/L   Total Bilirubin 0.6 0.3 - 1.2 mg/dL   GFR, Estimated >60 >60 mL/min    Comment: (NOTE) Calculated using the CKD-EPI  Creatinine Equation (2021)    Anion gap 8 5 - 15    Comment: Performed at [facility], [address] [place], [place] [address]  CBC     Status: Abnormal   Collection Time: 04/02/22  6:48 PM  Result Value Ref Range   WBC 8.0 4.0 - 10.5 K/uL   RBC 2.60 (L) 3.87 - 5.11 MIL/uL   Hemoglobin 7.6 (L) 12.0 - 15.0 g/dL   HCT 24.7 (L) 36.0 - 46.0 %   MCV 95.0 80.0 - 100.0 fL   MCH 29.2 26.0 - 34.0 pg   MCHC 30.8 30.0 - 36.0 g/dL   RDW 17.4 (H) 11.5 - 15.5 %   Platelets 429 (H) 150 - 400 K/uL   nRBC 0.0 0.0 - 0.2 %    Comment: Performed at [facility], [address] [place], [place] [address]  Lactic acid, plasma     Status: None   Collection Time: 04/02/22  9:09 PM  Result Value Ref Range   Lactic Acid, Venous 1.0 0.5 - 1.9 mmol/L    Comment: Performed at [facility], [address], [place], [place] [address]  Prepare RBC (crossmatch)     Status: None   Collection Time: 04/02/22  9:18 PM  Result Value Ref Range   Order Confirmation      ORDER PROCESSED BY BLOOD BANK Performed at [facility], [address], [place], [place] [address]   Protime-INR     Status: None   Collection Time: 04/02/22  9:28 PM  Result Value Ref Range   Prothrombin Time 15.2 11.4 - 15.2 seconds   INR 1.2 0.8 - 1.2    Comment: (NOTE) INR goal varies based on device and disease states. Performed at [facility], [address] [place], [place] [address]   APTT     Status: None   Collection Time: 04/02/22  9:28 PM  Result Value Ref Range   aPTT  29 24 - 36 seconds    Comment: Performed at [facility], [address] [place], [place] [address]  Type and screen [place]     Status: None (Preliminary result)   Collection Time: 04/02/22  9:34 PM  Result Value Ref Range   ABO/RH(D) O POS    Antibody Screen NEG    Sample Expiration 04/05/2022,2359    Unit Number [id number]    Blood Component Type RED CELLS,LR     Unit division 00    Status of Unit ISSUED    Transfusion Status OK TO TRANSFUSE    Crossmatch Result      Compatible Performed at [facility], [address] [place], [place] [address]     In Ed pt received  Meds ordered this encounter  Medications  . acetaminophen (OFIRMEV) IV 650 mg    Order Specific Question:   Is the patient UNABLE to take oral / enteral medications?    Answer:   No  . ondansetron (ZOFRAN) injection 4 mg  . sodium chloride 0.9 % bolus 500 mL  . 0.9 %  sodium chloride infusion  . pantoprazole (PROTONIX) injection 40 mg  . iohexol (OMNIPAQUE) 350 MG/ML injection 100 mL    Unresulted Labs (From admission, onward)    None        Admission Imaging : CT ANGIO GI BLEED  Result Date: 04/02/2022 CLINICAL DATA:  Abdominal pain, nausea, lower GI bleed EXAM: CTA ABDOMEN AND PELVIS WITHOUT AND WITH CONTRAST TECHNIQUE: Multidetector CT imaging of the abdomen and pelvis was performed using the standard protocol during bolus administration of intravenous contrast. Multiplanar reconstructed images and MIPs were obtained and reviewed to evaluate the vascular anatomy. RADIATION DOSE REDUCTION: This exam was performed according to the departmental dose-optimization program which includes automated exposure control, adjustment of the mA and/or kV according to patient size and/or use of iterative reconstruction technique. CONTRAST:  134m OMNIPAQUE IOHEXOL 350 MG/ML SOLN COMPARISON:  CT abdomen/pelvis dated 01/21/2022 FINDINGS: Lower chest: Mild branching scarring in the right lower lobe, likely post infectious inflammatory. Hepatobiliary: Liver is within normal limits. Status post cholecystectomy. No intrahepatic ductal dilatation. Mild dilatation of the common duct, likely postsurgical. Pancreas: Within normal limits. Spleen: Within normal limits. Adrenals/Urinary Tract: Adrenal glands are within normal limits. 12 mm lateral left lower pole renal cyst (series 17/image  70), benign (Bosniak I). No follow-up is recommended. Right kidney is within normal limits. No hydronephrosis. Bladder is within normal limits. Stomach/Bowel: Stomach is within normal limits. No evidence of bowel obstruction. Appendix is not discretely visualized. Extensive sigmoid diverticulosis, without evidence of diverticulitis. No finding to suggest active GI bleeding. However, there is long segment mucosal hyperenhancement involving the left colon on the portal venous phase, most pronounced at the splenic flexure and proximal descending colon. This appearance is nonspecific but at least raises the possibility of infectious/inflammatory colitis. Vascular/Lymphatic: No evidence of abdominal aortic aneurysm. Atherosclerotic calcifications of the abdominal aorta and branch vessels. Celiac artery, SMA, and IMA are patent. No suspicious abdominopelvic lymphadenopathy. Reproductive: Uterus is within normal limits. No adnexal masses. Other: No abdominopelvic ascites. Musculoskeletal: Mild degenerative changes of the lumbar spine. Status post ORIF of the right hip. IMPRESSION: No finding to suggest active GI bleeding. Possible infectious inflammatory colitis involving the left colon, although equivocal. Extensive sigmoid diverticulosis, without evidence of diverticulitis. Additional ancillary findings as above. Electronically Signed   By: SJulian HyM.D.   On: 04/02/2022 22:31  Physical Examination: Vitals:   04/02/22 2250 04/02/22 2300 04/02/22 2305 04/02/22 2330  BP: 133/74 136/79 123/85 132/79  Pulse: 93 88 92 88  Temp: 97.7 F (36.5 C)  98 F (36.7 C) Comment: 15 mins post start   Resp: '20  20 18  '$ Weight:      SpO2:  99% 99% 100%  TempSrc: Oral  Oral    Physical Exam Vitals and nursing note reviewed.  Constitutional:      General: She is not in acute distress.    Appearance: Normal appearance. She is not ill-appearing, toxic-appearing or diaphoretic.  HENT:     Head: Normocephalic  and atraumatic.     Right Ear: Hearing and external ear normal.     Left Ear: Hearing and external ear normal.     Nose: Nose normal. No nasal deformity.     Mouth/Throat:     Lips: Pink.     Mouth: Mucous membranes are moist.     Tongue: No lesions.     Pharynx: Oropharynx is clear.  Eyes:     Extraocular Movements: Extraocular movements intact.     Pupils: Pupils are equal, round, and reactive to light.  Neck:     Vascular: No carotid bruit.  Cardiovascular:     Rate and Rhythm: Normal rate and regular rhythm.     Pulses: Normal pulses.     Heart sounds: Normal heart sounds.  Pulmonary:     Effort: Pulmonary effort is normal.     Breath sounds: Normal breath sounds.  Abdominal:     General: Bowel sounds are normal. There is no distension.     Palpations: Abdomen is soft. There is no mass.     Tenderness: There is no abdominal tenderness. There is no guarding.     Hernia: No hernia is present.  Musculoskeletal:     Right lower leg: No edema.     Left lower leg: No edema.  Skin:    General: Skin is warm.  Neurological:     General: No focal deficit present.     Mental Status: She is alert and oriented to person, place, and time.     Cranial Nerves: Cranial nerves 2-12 are intact.     Motor: Motor function is intact.  Psychiatric:        Attention and Perception: Attention normal.        Mood and Affect: Mood normal.        Speech: Speech normal.        Behavior: Behavior normal. Behavior is cooperative.        Cognition and Memory: Cognition normal.        Assessment and Plan: * Abdominal pain Attribute to peptic ulcer disease. We will start patient on IV PPI therapy, as needed morphine for pain. GI consult Dr. Allen Norris, Per, ED physician has been contacted and will see patient tomorrow.  Upper GI bleed Patient found to have anemia with a hemoglobin of 7.6 suspect secondary to acute blood loss anemia due to GI bleed.    Latest Ref Rng & Units 04/02/2022    6:48 PM  03/25/2022    1:27 PM 01/25/2022    2:16 PM  CBC  WBC 4.0 - 10.5 K/uL 8.0  5.6  9.9   Hemoglobin 12.0 - 15.0 g/dL 7.6  8.0  8.7   Hematocrit 36.0 - 46.0 % 24.7  24.9  27.2   Platelets 150 - 400 K/uL 429  268  313  GI consult per ED provider. IV PPI therapy.   Intractable nausea and vomiting As needed antiemetics. IV PPI therapy. Clear liquid diet.   GERD (gastroesophageal reflux disease) IV PPI therapy. N.p.o. after midnight.   Colitis Colitis on CT scan. Less likely. We will continue patient on IV PPI therapy and IV fluids.   RA (rheumatoid arthritis) ([place]) Per chart review patient noncompliant with methotrexate, we we will continue once med rec is available and patient is able to take p.o. currently patient is n.p.o.  Hypothyroid Continue patient's home dose of levothyroxine at 50 mcg.   Tobacco abuse Nicotine patch.   Acute blood loss anemia Secondary to her acute on chronic GI blood loss. Again we will continue with IV PPI therapy and GI has been consulted.            DVT prophylaxis:  ***   Code Status:  ***   Family Communication:  ***   Disposition Plan:  ***   Consults called:  ***  Admission status: ***   Unit/ Expected LOS: ***   Para Skeans MD [facility]  6 PM- 2 AM. Please contact me via secure Chat 6 PM-2 AM. [phone] ( Pager ) To contact the [facility] Attending or Consulting provider [place] or covering provider during after hours [place], for this patient.   Check the care team in [facility] and look for a) attending/consulting [facility] provider listed and b) the [facility] team listed Log into [email or web address] and use [place]'s universal password to access. If you do not have the password, please contact the hospital operator. Locate the [facility] provider you are looking for under [facility] and page to a number that you can be directly reached. If you still have difficulty reaching the provider, please page the [facility]  (Director on Call) for the Hospitalists listed on amion for assistance. [email or web address] 04/03/2022, 12:03 AM

## 2022-04-02 NOTE — Assessment & Plan Note (Signed)
No more nausea vomiting reported.  We will offer regular diet if she can tolerate post EGD

## 2022-04-02 NOTE — ED Triage Notes (Signed)
Pt arrives with c/o ABD pain that started today. Pt endorses nausea and dry heaving. Pt denies fevers.

## 2022-04-02 NOTE — H&P (Signed)
History and Physical    Chief Complaint: Abdominal pain   HISTORY OF PRESENT ILLNESS: [name] is an 81 y.o. female seen in the emergency room with complaints of abdominal pain that started about lunchtime along with nausea pain was in the upper abdomen and epigastric area.  Patient also has intermittent dark stools.  No associated symptoms of fevers chills gross bleeding headaches blurred vision speech or gait issues. Patient does not take any NSAIDs, was admitted to the hospital in August for weakness and hypotension found to have acute blood loss anemia with a hemoglobin of 4.5, requiring 2 units of PRBC status post endoscopy and embolization of the gastroduodenal artery by vascular on 01/22/2022.   Pt has PMH as below: Past Medical History:  Diagnosis Date   Abdominal pain    Anemia    Anxiety    Arthritis, rheumatoid (HCC)    Breast pain    Chest discomfort    Chest pain    Chronic pain syndrome    Collagen vascular disease (HCC)    hx rheumatoid arthritis.    Constipation    COPD (chronic obstructive pulmonary disease) (HCC)    Diverticulosis    Fibrocystic breast disease    Gastritis    GERD (gastroesophageal reflux disease)    Hemorrhoids    Hx of colonic polyps    Hypothyroidism    Osteopenia    PONV (postoperative nausea and vomiting)    Renal artery stenosis (HCC)    Right ankle pain    Thyroid binding globulin deficiency    Tobacco abuse    Review of Systems  Gastrointestinal:  Positive for abdominal pain and nausea.       Melena    Allergies  Allergen Reactions   Other Other (See Comments)    Uncoded Allergy. Allergen: symmetral Uncoded Allergy. Allergen: etrafon Uncoded Allergy. Allergen: desbutal Uncoded Allergy. Allergen: symmetral Uncoded Allergy. Allergen: etrafon Uncoded Allergy. Allergen: desbutal Other reaction(s): Unknown Uncoded Allergy. Allergen: symmetral Other reaction(s): Unknown Uncoded Allergy. Allergen:  etrafon Uncoded Allergy. Allergen: symmetral Uncoded Allergy. Allergen: etrafon Uncoded Allergy. Allergen: desbutal    Sulfa Antibiotics     Other reaction(s): Unknown Uncoded Allergy. Allergen: desbutal   Denosumab Itching   Etrafon [Perphenazine-Amitriptyline]    Hydrocodone Nausea And Vomiting   Mirtazapine     Other reaction(s): Hallucination   Doxycycline Nausea Only   Percocet [Oxycodone-Acetaminophen] Nausea And Vomiting   Sertraline Palpitations   Past Surgical History:  Procedure Laterality Date   CHOLECYSTECTOMY     EMBOLIZATION N/A 01/22/2022   Procedure: EMBOLIZATION;  Surgeon: Katha Cabal, MD;  Location: [facility];  Service: Cardiovascular;  Laterality: N/A;   ESOPHAGOGASTRODUODENOSCOPY (EGD) WITH PROPOFOL N/A 01/22/2022   Procedure: ESOPHAGOGASTRODUODENOSCOPY (EGD) WITH PROPOFOL;  Surgeon: Lucilla Lame, MD;  Location: [facility];  Service: Endoscopy;  Laterality: N/A;   JOINT REPLACEMENT     right hip   LEG SURGERY     TONSILLECTOMY      Social History   Socioeconomic History   Marital status: Single    Spouse name: Not on file   Number of children: Not on file   Years of education: Not on file   Highest education level: Not on file  Occupational History   Not on file  Tobacco Use   Smoking status: Some Days    Packs/day: 0.10    Types: Cigarettes   Smokeless tobacco: Never   Tobacco comments:    Pt maybe has 1 cig a  month  Vaping Use   Vaping Use: Never used  Substance and Sexual Activity   Alcohol use: No   Drug use: No   Sexual activity: Not Currently  Other Topics Concern   Not on file  Social History Narrative   Not on file   Social Determinants of Health   Financial Resource Strain: Not on file  Food Insecurity: Not on file  Transportation Needs: Not on file  Physical Activity: Not on file  Stress: Not on file  Social Connections: Not on file      CURRENT MEDS:  Current Facility-Administered Medications  (Endocrine & Metabolic):    levothyroxine (SYNTHROID) tablet 50 mcg  Current Outpatient Medications (Endocrine & Metabolic):    levothyroxine (SYNTHROID) 50 MCG tablet, Take 50 mcg by mouth daily.   Current Facility-Administered Medications (Cardiovascular):    atorvastatin (LIPITOR) tablet 10 mg   hydrALAZINE (APRESOLINE) injection 5 mg  Current Outpatient Medications (Cardiovascular):    atorvastatin (LIPITOR) 10 MG tablet, Take 10 mg by mouth daily.    Current Facility-Administered Medications (Analgesics):    acetaminophen (OFIRMEV) IV 650 mg   morphine (PF) 2 MG/ML injection 2 mg   Current Facility-Administered Medications (Hematological):    folic acid (FOLVITE) tablet 1 mg  Current Outpatient Medications (Hematological):    folic acid (FOLVITE) 1 MG tablet, Take 1 tablet by mouth daily.  Current Facility-Administered Medications (Other):    0.9 %  sodium chloride infusion   buPROPion (WELLBUTRIN XL) 24 hr tablet 150 mg   DULoxetine (CYMBALTA) DR capsule 60 mg   galantamine (RAZADYNE) tablet 8 mg   lactated ringers infusion   nicotine (NICODERM CQ - dosed in mg/24 hours) patch 14 mg   ondansetron (ZOFRAN) tablet 4 mg **OR** ondansetron (ZOFRAN) injection 4 mg  Current Outpatient Medications (Other):    buPROPion (WELLBUTRIN XL) 150 MG 24 hr tablet, Take 150 mg by mouth daily.   dicyclomine (BENTYL) 10 MG capsule, Take 10 mg by mouth 2 (two) times daily as needed.   DULoxetine (CYMBALTA) 60 MG capsule, Take 60 mg by mouth daily.   galantamine (RAZADYNE) 8 MG tablet, Take 8 mg by mouth daily.   lidocaine (LIDODERM) 5 %, Place 1 patch onto the skin daily. Remove & Discard patch within 12 hours or as directed by MD   methotrexate 250 MG/10ML injection, ADMINISTER 0.8 ML ONCE A WEEK   nicotine (NICODERM CQ - DOSED IN MG/24 HOURS) 14 mg/24hr patch, Place 1 patch (14 mg total) onto the skin daily.   ondansetron (ZOFRAN-ODT) 4 MG disintegrating tablet, Take 1 tablet (4 mg  total) by mouth every 8 (eight) hours as needed for nausea or vomiting. (Patient not taking: Reported on 01/21/2022)   pantoprazole (PROTONIX) 40 MG tablet, Take 1 tablet (40 mg total) by mouth 2 (two) times daily before a meal.   senna-docusate (SENOKOT-S) 8.6-50 MG tablet, Take 2 tablets by mouth 2 (two) times daily as needed for mild constipation or moderate constipation.   tiZANidine (ZANAFLEX) 2 MG tablet, Take 2 mg by mouth 2 (two) times daily.    ED Course: Pt in Ed alert awake oriented afebrile blood pressure stable. Vitals:   04/02/22 2330 04/02/22 2350 04/03/22 0000 04/03/22 0003  BP: 132/79 133/70 137/70   Pulse: 88 90 (!) 48 87  Resp: 18 18    Temp:  98 F (36.7 C)    TempSrc:  Oral    SpO2: 100% 100% 100% 99%  Weight:  No intake/output data recorded. SpO2: 99 % Blood work in ed shows: Anemia of seven-point Results for orders placed or performed during the hospital encounter of 04/02/22 (from the past 48 hour(s))  Urinalysis, Routine w reflex microscopic     Status: Abnormal   Collection Time: 04/02/22  6:47 PM  Result Value Ref Range   Color, Urine YELLOW (A) YELLOW   APPearance HAZY (A) CLEAR   Specific Gravity, Urine 1.014 1.005 - 1.030   pH 7.0 5.0 - 8.0   Glucose, UA NEGATIVE NEGATIVE mg/dL   Hgb urine dipstick NEGATIVE NEGATIVE   Bilirubin Urine NEGATIVE NEGATIVE   Ketones, ur NEGATIVE NEGATIVE mg/dL   Protein, ur NEGATIVE NEGATIVE mg/dL   Nitrite NEGATIVE NEGATIVE   Leukocytes,Ua NEGATIVE NEGATIVE    Comment: Performed at [facility], [address] [place], [place] [address]  Lipase, blood     Status: None   Collection Time: 04/02/22  6:48 PM  Result Value Ref Range   Lipase 38 11 - 51 U/L    Comment: Performed at [facility], [address] [place], [place] [address]  Comprehensive metabolic panel     Status: Abnormal   Collection Time: 04/02/22  6:48 PM  Result Value Ref Range   Sodium 134 (L) 135 - 145 mmol/L    Potassium 3.8 3.5 - 5.1 mmol/L   Chloride 102 98 - 111 mmol/L   CO2 24 22 - 32 mmol/L   Glucose, Bld 110 (H) 70 - 99 mg/dL    Comment: Glucose reference range applies only to samples taken after fasting for at least 8 hours.   BUN 22 8 - 23 mg/dL   Creatinine, Ser 0.89 0.44 - 1.00 mg/dL   Calcium 8.0 (L) 8.9 - 10.3 mg/dL   Total Protein 7.1 6.5 - 8.1 g/dL   Albumin 3.4 (L) 3.5 - 5.0 g/dL   AST 21 15 - 41 U/L   ALT 12 0 - 44 U/L   Alkaline Phosphatase 79 38 - 126 U/L   Total Bilirubin 0.6 0.3 - 1.2 mg/dL   GFR, Estimated >60 >60 mL/min    Comment: (NOTE) Calculated using the CKD-EPI Creatinine Equation (2021)    Anion gap 8 5 - 15    Comment: Performed at [facility], [address] [place], [place] [address]  CBC     Status: Abnormal   Collection Time: 04/02/22  6:48 PM  Result Value Ref Range   WBC 8.0 4.0 - 10.5 K/uL   RBC 2.60 (L) 3.87 - 5.11 MIL/uL   Hemoglobin 7.6 (L) 12.0 - 15.0 g/dL   HCT 24.7 (L) 36.0 - 46.0 %   MCV 95.0 80.0 - 100.0 fL   MCH 29.2 26.0 - 34.0 pg   MCHC 30.8 30.0 - 36.0 g/dL   RDW 17.4 (H) 11.5 - 15.5 %   Platelets 429 (H) 150 - 400 K/uL   nRBC 0.0 0.0 - 0.2 %    Comment: Performed at [facility], [address] [place], [place] [address]  Lactic acid, plasma     Status: None   Collection Time: 04/02/22  9:09 PM  Result Value Ref Range   Lactic Acid, Venous 1.0 0.5 - 1.9 mmol/L    Comment: Performed at [facility], [address], [place], [place] [address]  Prepare RBC (crossmatch)     Status: None   Collection Time: 04/02/22  9:18 PM  Result Value Ref Range   Order Confirmation  ORDER PROCESSED BY BLOOD BANK Performed at [facility], [address] [place], [place] [address]   Protime-INR     Status: None   Collection Time: 04/02/22  9:28 PM  Result Value Ref Range   Prothrombin Time 15.2 11.4 - 15.2 seconds   INR 1.2 0.8 - 1.2    Comment: (NOTE) INR goal varies based on device and disease  states. Performed at [facility], [address] [place], [place] [address]   APTT     Status: None   Collection Time: 04/02/22  9:28 PM  Result Value Ref Range   aPTT 29 24 - 36 seconds    Comment: Performed at [facility], [address] [place], [place] [address]  Type and screen Altha     Status: None (Preliminary result)   Collection Time: 04/02/22  9:34 PM  Result Value Ref Range   ABO/RH(D) O POS    Antibody Screen NEG    Sample Expiration 04/05/2022,2359    Unit Number [id number]    Blood Component Type RED CELLS,LR    Unit division 00    Status of Unit ISSUED    Transfusion Status OK TO TRANSFUSE    Crossmatch Result      Compatible Performed at [facility], [address] [place], [place] [address]     In Ed pt received  Meds ordered this encounter  Medications   acetaminophen (OFIRMEV) IV 650 mg    Order Specific Question:   Is the patient UNABLE to take oral / enteral medications?    Answer:   No   ondansetron (ZOFRAN) injection 4 mg   sodium chloride 0.9 % bolus 500 mL   0.9 %  sodium chloride infusion   pantoprazole (PROTONIX) injection 40 mg   iohexol (OMNIPAQUE) 350 MG/ML injection 100 mL   levothyroxine (SYNTHROID) tablet 50 mcg   buPROPion (WELLBUTRIN XL) 24 hr tablet 150 mg   nicotine (NICODERM CQ - dosed in mg/24 hours) patch 14 mg   atorvastatin (LIPITOR) tablet 10 mg   DULoxetine (CYMBALTA) DR capsule 60 mg   folic acid (FOLVITE) tablet 1 mg   galantamine (RAZADYNE) tablet 8 mg   morphine (PF) 2 MG/ML injection 2 mg   OR Linked Order Group    ondansetron (ZOFRAN) tablet 4 mg    ondansetron (ZOFRAN) injection 4 mg   hydrALAZINE (APRESOLINE) injection 5 mg   lactated ringers infusion    Unresulted Labs (From admission, onward)     Start     Ordered   04/03/22 0500  Comprehensive metabolic panel  Tomorrow morning,   STAT        04/03/22 0026   04/03/22 0500  CBC  Tomorrow  morning,   STAT        04/03/22 0026             Admission Imaging : CT ANGIO GI BLEED  Result Date: 04/02/2022 CLINICAL DATA:  Abdominal pain, nausea, lower GI bleed EXAM: CTA ABDOMEN AND PELVIS WITHOUT AND WITH CONTRAST TECHNIQUE: Multidetector CT imaging of the abdomen and pelvis was performed using the standard protocol during bolus administration of intravenous contrast. Multiplanar reconstructed images and MIPs were obtained and reviewed to evaluate the vascular anatomy. RADIATION DOSE REDUCTION: This exam was performed according to the departmental dose-optimization program which includes automated exposure control, adjustment of the mA and/or kV according to patient size and/or use of iterative reconstruction technique. CONTRAST:  164m OMNIPAQUE IOHEXOL  350 MG/ML SOLN COMPARISON:  CT abdomen/pelvis dated 01/21/2022 FINDINGS: Lower chest: Mild branching scarring in the right lower lobe, likely post infectious inflammatory. Hepatobiliary: Liver is within normal limits. Status post cholecystectomy. No intrahepatic ductal dilatation. Mild dilatation of the common duct, likely postsurgical. Pancreas: Within normal limits. Spleen: Within normal limits. Adrenals/Urinary Tract: Adrenal glands are within normal limits. 12 mm lateral left lower pole renal cyst (series 17/image 70), benign (Bosniak I). No follow-up is recommended. Right kidney is within normal limits. No hydronephrosis. Bladder is within normal limits. Stomach/Bowel: Stomach is within normal limits. No evidence of bowel obstruction. Appendix is not discretely visualized. Extensive sigmoid diverticulosis, without evidence of diverticulitis. No finding to suggest active GI bleeding. However, there is long segment mucosal hyperenhancement involving the left colon on the portal venous phase, most pronounced at the splenic flexure and proximal descending colon. This appearance is nonspecific but at least raises the possibility of  infectious/inflammatory colitis. Vascular/Lymphatic: No evidence of abdominal aortic aneurysm. Atherosclerotic calcifications of the abdominal aorta and branch vessels. Celiac artery, SMA, and IMA are patent. No suspicious abdominopelvic lymphadenopathy. Reproductive: Uterus is within normal limits. No adnexal masses. Other: No abdominopelvic ascites. Musculoskeletal: Mild degenerative changes of the lumbar spine. Status post ORIF of the right hip. IMPRESSION: No finding to suggest active GI bleeding. Possible infectious inflammatory colitis involving the left colon, although equivocal. Extensive sigmoid diverticulosis, without evidence of diverticulitis. Additional ancillary findings as above. Electronically Signed   By: Julian Hy M.D.   On: 04/02/2022 22:31      Physical Examination: Vitals:   04/02/22 2330 04/02/22 2350 04/03/22 0000 04/03/22 0003  BP: 132/79 133/70 137/70   Pulse: 88 90 (!) 48 87  Temp:  98 F (36.7 C)    Resp: 18 18    Weight:      SpO2: 100% 100% 100% 99%  TempSrc:  Oral     Physical Exam Vitals and nursing note reviewed.  Constitutional:      General: She is not in acute distress.    Appearance: Normal appearance. She is not ill-appearing, toxic-appearing or diaphoretic.  HENT:     Head: Normocephalic and atraumatic.     Right Ear: Hearing and external ear normal.     Left Ear: Hearing and external ear normal.     Nose: Nose normal. No nasal deformity.     Mouth/Throat:     Lips: Pink.     Mouth: Mucous membranes are moist.     Tongue: No lesions.     Pharynx: Oropharynx is clear.  Eyes:     Extraocular Movements: Extraocular movements intact.     Pupils: Pupils are equal, round, and reactive to light.  Neck:     Vascular: No carotid bruit.  Cardiovascular:     Rate and Rhythm: Normal rate and regular rhythm.     Pulses: Normal pulses.     Heart sounds: Normal heart sounds.  Pulmonary:     Effort: Pulmonary effort is normal.     Breath sounds:  Normal breath sounds.  Abdominal:     General: Bowel sounds are normal. There is no distension.     Palpations: Abdomen is soft. There is no mass.     Tenderness: There is no abdominal tenderness. There is no guarding.     Hernia: No hernia is present.    Musculoskeletal:     Right lower leg: No edema.     Left lower leg: No edema.  Skin:  General: Skin is warm.  Neurological:     General: No focal deficit present.     Mental Status: She is alert and oriented to person, place, and time.     Cranial Nerves: Cranial nerves 2-12 are intact.     Motor: Motor function is intact.  Psychiatric:        Attention and Perception: Attention normal.        Mood and Affect: Mood normal.        Speech: Speech normal.        Behavior: Behavior normal. Behavior is cooperative.        Cognition and Memory: Cognition normal.      Assessment and Plan: * Abdominal pain Attribute to peptic ulcer disease.generalized abdominal pain. We will start patient on IV PPI therapy, as needed morphine for pain. GI consult Dr. Allen Norris, Per, ED physician has been contacted and will see patient tomorrow.  Upper GI bleed Patient found to have anemia with a hemoglobin of 7.6 suspect secondary to acute blood loss anemia due to GI bleed.    Latest Ref Rng & Units 04/02/2022    6:48 PM 03/25/2022    1:27 PM 01/25/2022    2:16 PM  CBC  WBC 4.0 - 10.5 K/uL 8.0  5.6  9.9   Hemoglobin 12.0 - 15.0 g/dL 7.6  8.0  8.7   Hematocrit 36.0 - 46.0 % 24.7  24.9  27.2   Platelets 150 - 400 K/uL 429  268  313     GI consult per ED provider. IV PPI therapy.   Intractable nausea and vomiting As needed antiemetics. IV PPI therapy. Clear liquid diet.   GERD (gastroesophageal reflux disease) IV PPI therapy. N.p.o. after midnight.   Colitis Colitis on CT scan. Less likely. We will continue patient on IV PPI therapy and IV fluids.   RA (rheumatoid arthritis) ([place]) Per chart review patient noncompliant with  methotrexate, we we will continue once med rec is available and patient is able to take p.o. currently patient is n.p.o.  Hypothyroid Continue patient's home dose of levothyroxine at 50 mcg.   Tobacco abuse Nicotine patch.   Acute blood loss anemia Secondary to her acute on chronic GI blood loss. Again we will continue with IV PPI therapy and GI has been consulted.    DVT prophylaxis:  SCD's   Code Status:  Full code    Family Communication:  Kymberlie, [place] (Brother)  [phone]    Disposition Plan:  Home    Consults called:  GI   Admission status: Inpatient    Unit/ Expected LOS: Progressive / 2 days.    Para Skeans MD [facility]  6 PM- 2 AM. Please contact me via secure Chat 6 PM-2 AM. [phone] ( Pager ) To contact the [facility] Attending or Consulting provider [place] or covering provider during after hours [place], for this patient.   Check the care team in [facility] and look for a) attending/consulting [facility] provider listed and b) the [facility] team listed Log into [email or web address] and use [place]'s universal password to access. If you do not have the password, please contact the hospital operator. Locate the [facility] provider you are looking for under [facility] and page to a number that you can be directly reached. If you still have difficulty reaching the provider, please page the [facility] (Director on Call) for the Hospitalists listed on amion for assistance. [email or web address] 04/03/2022, 12:27 AM

## 2022-04-03 ENCOUNTER — Encounter: Payer: Self-pay | Admitting: Internal Medicine

## 2022-04-03 ENCOUNTER — Inpatient Hospital Stay: Payer: Medicare Other | Admitting: Anesthesiology

## 2022-04-03 ENCOUNTER — Other Ambulatory Visit: Payer: Self-pay

## 2022-04-03 ENCOUNTER — Encounter
Admission: EM | Disposition: A | Discharge status: Home / Self Care | Payer: Self-pay | Source: Home / Self Care | Attending: Internal Medicine

## 2022-04-03 DIAGNOSIS — R112 Nausea with vomiting, unspecified: Secondary | ICD-10-CM

## 2022-04-03 DIAGNOSIS — Z882 Allergy status to sulfonamides status: Secondary | ICD-10-CM | POA: Diagnosis not present

## 2022-04-03 DIAGNOSIS — F17210 Nicotine dependence, cigarettes, uncomplicated: Secondary | ICD-10-CM | POA: Diagnosis present

## 2022-04-03 DIAGNOSIS — Z79631 Long term (current) use of antimetabolite agent: Secondary | ICD-10-CM | POA: Diagnosis not present

## 2022-04-03 DIAGNOSIS — K922 Gastrointestinal hemorrhage, unspecified: Secondary | ICD-10-CM | POA: Diagnosis not present

## 2022-04-03 DIAGNOSIS — D62 Acute posthemorrhagic anemia: Secondary | ICD-10-CM | POA: Diagnosis present

## 2022-04-03 DIAGNOSIS — Z888 Allergy status to other drugs, medicaments and biological substances status: Secondary | ICD-10-CM | POA: Diagnosis not present

## 2022-04-03 DIAGNOSIS — N6019 Diffuse cystic mastopathy of unspecified breast: Secondary | ICD-10-CM | POA: Diagnosis present

## 2022-04-03 DIAGNOSIS — K2901 Acute gastritis with bleeding: Secondary | ICD-10-CM

## 2022-04-03 DIAGNOSIS — Z881 Allergy status to other antibiotic agents status: Secondary | ICD-10-CM | POA: Diagnosis not present

## 2022-04-03 DIAGNOSIS — R109 Unspecified abdominal pain: Secondary | ICD-10-CM | POA: Diagnosis not present

## 2022-04-03 DIAGNOSIS — G894 Chronic pain syndrome: Secondary | ICD-10-CM | POA: Diagnosis present

## 2022-04-03 DIAGNOSIS — K529 Noninfective gastroenteritis and colitis, unspecified: Secondary | ICD-10-CM | POA: Diagnosis present

## 2022-04-03 DIAGNOSIS — M8580 Other specified disorders of bone density and structure, unspecified site: Secondary | ICD-10-CM | POA: Diagnosis present

## 2022-04-03 DIAGNOSIS — K2970 Gastritis, unspecified, without bleeding: Secondary | ICD-10-CM | POA: Diagnosis present

## 2022-04-03 DIAGNOSIS — Z91148 Patient's other noncompliance with medication regimen for other reason: Secondary | ICD-10-CM | POA: Diagnosis not present

## 2022-04-03 DIAGNOSIS — Z79899 Other long term (current) drug therapy: Secondary | ICD-10-CM | POA: Diagnosis not present

## 2022-04-03 DIAGNOSIS — Z79890 Hormone replacement therapy (postmenopausal): Secondary | ICD-10-CM | POA: Diagnosis not present

## 2022-04-03 DIAGNOSIS — K219 Gastro-esophageal reflux disease without esophagitis: Secondary | ICD-10-CM

## 2022-04-03 DIAGNOSIS — M069 Rheumatoid arthritis, unspecified: Secondary | ICD-10-CM | POA: Diagnosis present

## 2022-04-03 DIAGNOSIS — Z86010 Personal history of colonic polyps: Secondary | ICD-10-CM | POA: Diagnosis not present

## 2022-04-03 DIAGNOSIS — J449 Chronic obstructive pulmonary disease, unspecified: Secondary | ICD-10-CM | POA: Diagnosis present

## 2022-04-03 DIAGNOSIS — Z885 Allergy status to narcotic agent status: Secondary | ICD-10-CM | POA: Diagnosis not present

## 2022-04-03 DIAGNOSIS — R1010 Upper abdominal pain, unspecified: Secondary | ICD-10-CM | POA: Diagnosis present

## 2022-04-03 DIAGNOSIS — E039 Hypothyroidism, unspecified: Secondary | ICD-10-CM | POA: Diagnosis present

## 2022-04-03 DIAGNOSIS — F419 Anxiety disorder, unspecified: Secondary | ICD-10-CM | POA: Diagnosis present

## 2022-04-03 HISTORY — PX: ESOPHAGOGASTRODUODENOSCOPY (EGD) WITH PROPOFOL: SHX5813

## 2022-04-03 LAB — COMPREHENSIVE METABOLIC PANEL
ALT: 10 U/L (ref 0–44)
AST: 19 U/L (ref 15–41)
Albumin: 2.9 g/dL — ABNORMAL LOW (ref 3.5–5.0)
Alkaline Phosphatase: 67 U/L (ref 38–126)
Anion gap: 4 — ABNORMAL LOW (ref 5–15)
BUN: 16 mg/dL (ref 8–23)
CO2: 25 mmol/L (ref 22–32)
Calcium: 7.8 mg/dL — ABNORMAL LOW (ref 8.9–10.3)
Chloride: 109 mmol/L (ref 98–111)
Creatinine, Ser: 0.79 mg/dL (ref 0.44–1.00)
GFR, Estimated: >60 mL/min (ref >60)
Glucose, Bld: 85 mg/dL (ref 70–99)
Potassium: 4 mmol/L (ref 3.5–5.1)
Sodium: 138 mmol/L (ref 135–145)
Total Bilirubin: 1.1 mg/dL (ref 0.3–1.2)
Total Protein: 5.9 g/dL — ABNORMAL LOW (ref 6.5–8.1)

## 2022-04-03 LAB — CBC
HCT: 26.2 % — ABNORMAL LOW (ref 36.0–46.0)
HCT: 31.6 % — ABNORMAL LOW (ref 36.0–46.0)
Hemoglobin: 8.2 g/dL — ABNORMAL LOW (ref 12.0–15.0)
Hemoglobin: 10.1 g/dL — ABNORMAL LOW (ref 12.0–15.0)
MCH: 28.2 pg (ref 26.0–34.0)
MCH: 28.8 pg (ref 26.0–34.0)
MCHC: 31.3 g/dL (ref 30.0–36.0)
MCHC: 32 g/dL (ref 30.0–36.0)
MCV: 90 fL (ref 80.0–100.0)
MCV: 90 fL (ref 80.0–100.0)
Platelets: 373 10*3/uL (ref 150–400)
Platelets: 355 10*3/uL (ref 150–400)
RBC: 2.91 MIL/uL — ABNORMAL LOW (ref 3.87–5.11)
RBC: 3.51 MIL/uL — ABNORMAL LOW (ref 3.87–5.11)
RDW: 18.6 % — ABNORMAL HIGH (ref 11.5–15.5)
RDW: 18 % — ABNORMAL HIGH (ref 11.5–15.5)
WBC: 8.6 10*3/uL (ref 4.0–10.5)
WBC: 7.5 10*3/uL (ref 4.0–10.5)
nRBC: 0 % (ref 0.0–0.2)
nRBC: 0 % (ref 0.0–0.2)

## 2022-04-03 LAB — PREPARE RBC (CROSSMATCH)

## 2022-04-03 LAB — LACTIC ACID, PLASMA: Lactic Acid, Venous: 0.8 mmol/L (ref 0.5–1.9)

## 2022-04-03 SURGERY — ESOPHAGOGASTRODUODENOSCOPY (EGD) WITH PROPOFOL
Anesthesia: General

## 2022-04-03 MED ORDER — DULOXETINE HCL 20 MG PO CPEP
20.0000 mg | ORAL_CAPSULE | Freq: Every day | ORAL | Status: DC
  Administered 2022-04-04: 20 mg via ORAL
  Filled 2022-04-03: qty 1

## 2022-04-03 MED ORDER — NICOTINE 14 MG/24HR TD PT24: 14.0000 mg | MEDICATED_PATCH | Freq: Every day | TRANSDERMAL | Status: DC

## 2022-04-03 MED ORDER — HYDRALAZINE HCL 20 MG/ML IJ SOLN
INTRAMUSCULAR | Status: AC
  Filled 2022-04-03: qty 1

## 2022-04-03 MED ORDER — SODIUM CHLORIDE 0.9 % IV SOLN: Freq: Once | INTRAVENOUS | Status: AC

## 2022-04-03 MED ORDER — LEVOTHYROXINE SODIUM 50 MCG PO TABS
50.0000 ug | ORAL_TABLET | Freq: Every day | ORAL | Status: DC
  Administered 2022-04-04: 50 ug via ORAL
  Filled 2022-04-03: qty 1

## 2022-04-03 MED ORDER — FOLIC ACID 1 MG PO TABS
1.0000 mg | ORAL_TABLET | Freq: Every day | ORAL | Status: DC
  Administered 2022-04-04: 1 mg via ORAL
  Filled 2022-04-03: qty 1

## 2022-04-03 MED ORDER — BUPROPION HCL ER (XL) 150 MG PO TB24
150.0000 mg | ORAL_TABLET | Freq: Every day | ORAL | Status: DC
  Administered 2022-04-04: 150 mg via ORAL
  Filled 2022-04-03: qty 1

## 2022-04-03 MED ORDER — MORPHINE SULFATE (PF) 2 MG/ML IV SOLN
2.0000 mg | INTRAVENOUS | Status: DC | PRN
  Administered 2022-04-03 (×2): 2 mg via INTRAVENOUS
  Filled 2022-04-03 (×2): qty 1

## 2022-04-03 MED ORDER — DULOXETINE HCL 60 MG PO CPEP: 60.0000 mg | ORAL_CAPSULE | Freq: Every day | ORAL | Status: DC

## 2022-04-03 MED ORDER — ATORVASTATIN CALCIUM 10 MG PO TABS
10.0000 mg | ORAL_TABLET | Freq: Every day | ORAL | Status: DC
  Administered 2022-04-04: 10 mg via ORAL
  Filled 2022-04-03: qty 1

## 2022-04-03 MED ORDER — GALANTAMINE HYDROBROMIDE 4 MG PO TABS: 8.0000 mg | ORAL_TABLET | Freq: Every day | ORAL | Status: DC

## 2022-04-03 MED ORDER — PROPOFOL 10 MG/ML IV BOLUS
INTRAVENOUS | Status: AC
  Filled 2022-04-03: qty 20

## 2022-04-03 MED ORDER — LACTATED RINGERS IV SOLN: INTRAVENOUS | Status: AC

## 2022-04-03 MED ORDER — PROPOFOL 10 MG/ML IV BOLUS
INTRAVENOUS | Status: DC | PRN
  Administered 2022-04-03: 50 mg via INTRAVENOUS
  Administered 2022-04-03: 20 mg via INTRAVENOUS
  Administered 2022-04-03: 30 mg via INTRAVENOUS

## 2022-04-03 MED ORDER — PANTOPRAZOLE SODIUM 40 MG IV SOLR
40.0000 mg | Freq: Two times a day (BID) | INTRAVENOUS | Status: DC
  Administered 2022-04-03 – 2022-04-04 (×3): 40 mg via INTRAVENOUS
  Filled 2022-04-03 (×3): qty 10

## 2022-04-03 MED ORDER — HYDRALAZINE HCL 20 MG/ML IJ SOLN: 5.0000 mg | INTRAMUSCULAR | Status: DC | PRN

## 2022-04-03 MED ORDER — ONDANSETRON HCL 4 MG/2ML IJ SOLN: 4.0000 mg | Freq: Four times a day (QID) | INTRAMUSCULAR | Status: DC | PRN

## 2022-04-03 MED ORDER — HYDRALAZINE HCL 20 MG/ML IJ SOLN
INTRAMUSCULAR | Status: DC | PRN
  Administered 2022-04-03: 4 mg via INTRAVENOUS

## 2022-04-03 MED ORDER — LIDOCAINE HCL (CARDIAC) PF 100 MG/5ML IV SOSY
PREFILLED_SYRINGE | INTRAVENOUS | Status: DC | PRN
  Administered 2022-04-03: 40 mg via INTRAVENOUS

## 2022-04-03 MED ORDER — LIDOCAINE HCL (PF) 2 % IJ SOLN
INTRAMUSCULAR | Status: AC
  Filled 2022-04-03: qty 5

## 2022-04-03 MED ORDER — ONDANSETRON HCL 4 MG PO TABS: 4.0000 mg | ORAL_TABLET | Freq: Four times a day (QID) | ORAL | Status: DC | PRN

## 2022-04-03 NOTE — Hospital Course (Addendum)
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  EGD shows gastritis but no bleeding

## 2022-04-03 NOTE — Transfer of Care (Signed)
Immediate Anesthesia Transfer of Care Note  Patient: [name]  Procedure(s) Performed: ESOPHAGOGASTRODUODENOSCOPY (EGD) WITH PROPOFOL  Patient Location: PACU and Endoscopy Unit  Anesthesia Type:General  Level of Consciousness: drowsy  Airway & Oxygen Therapy: Patient Spontanous Breathing  Post-op Assessment: Report given to RN  Post vital signs: stable  Last Vitals:  Vitals Value Taken Time  BP 134/80 04/03/22 1254  Temp    Pulse 85 04/03/22 1254  Resp    SpO2 99 % 04/03/22 1254  Vitals shown include unvalidated device data.  Last Pain:  Vitals:   04/03/22 1202  TempSrc: Temporal  PainSc: 5          Complications: No notable events documented.

## 2022-04-03 NOTE — Progress Notes (Signed)
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  EGD shows gastritis but no bleeding     Assessment and Plan: * Abdominal pain Could be due to gastritis seen on EGD.  Continue PPI  GI bleed Patient found to have anemia with a hemoglobin of 7.6 suspect secondary to acute blood loss anemia due to GI bleed.    Latest Ref Rng & Units 04/02/2022    6:48 PM 03/25/2022    1:27 PM 01/25/2022    2:16 PM  CBC  WBC 4.0 - 10.5 K/uL 8.0  5.6  9.9   Hemoglobin 12.0 - 15.0 g/dL 7.6  8.0  8.7   Hematocrit 36.0 - 46.0 % 24.7  24.9  27.2   Platelets 150 - 400 K/uL 429  268  313     Continue IV Protonix 40 mg twice daily Status post 2 PRBC transfusion EGD showed gastritis but no active bleeding.  Monitor H&H  Intractable nausea and vomiting No more nausea vomiting reported.  We will offer regular diet if she can tolerate post EGD   GERD (gastroesophageal reflux disease) Continue Protonix 40 mg twice daily   Colitis Colitis on CT scan. Clinically seems less likely.  She is afebrile continue patient on IV PPI therapy and IV fluids.   RA (rheumatoid arthritis) (HCC) Hold medications for now. Per chart review patient noncompliant with methotrexate  Hypothyroid Continue levothyroxine at 50 mcg.   Tobacco abuse Nicotine patch.   Acute blood loss anemia Secondary to her acute on chronic GI blood loss. EGD shows no active bleeding.  2 PRBC transfusion on admission.  Continue Protonix 40 mg IV twice daily.  Monitor H&H     Latest Ref Rng & Units 04/03/2022    5:41 AM 04/03/2022    1:12 AM 04/02/2022     6:48 PM  CBC  WBC 4.0 - 10.5 K/uL 7.5  8.6  8.0   Hemoglobin 12.0 - 15.0 g/dL 10.1  8.2  7.6   Hematocrit 36.0 - 46.0 % 31.6  26.2  24.7   Platelets 150 - 400 K/uL 355  373  429           Subjective: Complains of periumbilical pain.  Denies any further dark stools while here  Physical Exam: Vitals:   04/03/22 1254 04/03/22 1314 04/03/22 1331 04/03/22 1348  BP: 134/80 138/74  121/82  Pulse:    81  Resp: 17     Temp: (!) 97 F (36.1 C)   98.4 F (36.9 C)  TempSrc: Temporal   Oral  SpO2: 99%     Weight:   45.4 kg   Height:   5' 4.02" (1.38 m)    81 year old female lying in the bed comfortably without any acute distress Lungs clear to auscultation bilaterally Cardiovascular regular rate and rhythm Abdomen soft, benign Neuro alert and awake, nonfocal  Data Reviewed:  EGD showed gastritis  Family Communication: None  Disposition: Status is: Inpatient Remains inpatient appropriate because: Monitoring for GI bleed   Planned Discharge Destination: Home    DVT prophylaxis-SCDs Time spent: 35 minutes  Author: Max Sane, MD 04/03/2022 2:14 PM  For on call review [email or web address].

## 2022-04-03 NOTE — Assessment & Plan Note (Signed)
Continue patient's home dose of levothyroxine at 50 mcg.

## 2022-04-03 NOTE — Assessment & Plan Note (Signed)
-  Nicotine patch 

## 2022-04-03 NOTE — ED Notes (Signed)
Discussed administering the second unit of PRBC to the patient with Dr. Posey Pronto. Hold second unit until H&H is rechecked and see if the second unit is warranted. Blood bank notified.

## 2022-04-03 NOTE — Assessment & Plan Note (Addendum)
IV PPI therapy. N.p.o. after midnight.

## 2022-04-03 NOTE — ED Notes (Addendum)
Informed Dr. Posey Pronto of the patients Hgb of 8.2; ok to administer 2nd unit and obtain 2nd lactic acid level.

## 2022-04-03 NOTE — Anesthesia Postprocedure Evaluation (Signed)
Anesthesia Post Note  Patient: [name]  Procedure(s) Performed: ESOPHAGOGASTRODUODENOSCOPY (EGD) WITH PROPOFOL  Patient location during evaluation: Endoscopy Anesthesia Type: General Level of consciousness: awake and alert Pain management: pain level controlled Vital Signs Assessment: post-procedure vital signs reviewed and stable Respiratory status: spontaneous breathing, nonlabored ventilation, respiratory function stable and patient connected to nasal cannula oxygen Cardiovascular status: blood pressure returned to baseline and stable Postop Assessment: no apparent nausea or vomiting Anesthetic complications: no   No notable events documented.   Last Vitals:  Vitals:   04/03/22 1314 04/03/22 1348  BP: 138/74 121/82  Pulse:  81  Resp:    Temp:  36.9 C  SpO2:      Last Pain:  Vitals:   04/03/22 1348  TempSrc: Oral  PainSc:                  Precious Haws Karista Aispuro

## 2022-04-03 NOTE — Consult Note (Signed)
Lucilla Lame, MD [facility]  [address] [place], [place] [address] Phone: [phone] Fax : [phone]  Consultation  Referring Provider:     Dr. Jacqualine Code Primary Care Physician:  Rusty Aus, MD Primary Gastroenterologist:  Dr. Gunnar Bulla         Reason for Consultation:     Upper GI bleed  Date of Admission:  04/02/2022 Date of Consultation:  04/03/2022         HPI:   [name] is a 81 y.o. female who had seen me back in August for an upper GI bleed with a bleeding in the small bowel that cannot be controlled.  Clips were put in the area and the patient was then sent for intervention with vascular surgery.  The patient did well and was discharged.  The patient now reports that she has periumbilical tenderness and black stools.  The patient's CT angiography showed possible colitis but no sign of active GI bleeding.  The patient was transfused 2 units of blood since admission.  Patient was admitted with a hemoglobin of 7.6 with her hemoglobin being 8.0 nine days ago.  After the transfusion of 2 units the patient's hemoglobin this morning was 10.1.  She has had no further sign of any GI bleeding.  The patient denies any alcohol abuse or NSAID use.  Past Medical History:  Diagnosis Date   Abdominal pain    Anemia    Anxiety    Arthritis, rheumatoid (HCC)    Breast pain    Chest discomfort    Chest pain    Chronic pain syndrome    Collagen vascular disease (HCC)    hx rheumatoid arthritis.    Constipation    COPD (chronic obstructive pulmonary disease) (HCC)    Diverticulosis    Fibrocystic breast disease    Gastritis    GERD (gastroesophageal reflux disease)    Hemorrhoids    Hx of colonic polyps    Hypothyroidism    Osteopenia    PONV (postoperative nausea and vomiting)    Renal artery stenosis (HCC)    Right ankle pain    Thyroid binding globulin deficiency    Tobacco abuse     Past Surgical History:  Procedure Laterality Date   CHOLECYSTECTOMY      EMBOLIZATION N/A 01/22/2022   Procedure: EMBOLIZATION;  Surgeon: Katha Cabal, MD;  Location: [facility];  Service: Cardiovascular;  Laterality: N/A;   ESOPHAGOGASTRODUODENOSCOPY (EGD) WITH PROPOFOL N/A 01/22/2022   Procedure: ESOPHAGOGASTRODUODENOSCOPY (EGD) WITH PROPOFOL;  Surgeon: Lucilla Lame, MD;  Location: [facility] ENDOSCOPY;  Service: Endoscopy;  Laterality: N/A;   JOINT REPLACEMENT     right hip   LEG SURGERY     TONSILLECTOMY      Prior to Admission medications   Medication Sig Start Date End Date Taking? Authorizing Provider  atorvastatin (LIPITOR) 10 MG tablet Take 10 mg by mouth daily. 07/22/21  Yes [provider]  buPROPion (WELLBUTRIN XL) 150 MG 24 hr tablet Take 150 mg by mouth daily. 11/22/19  Yes [provider]  dicyclomine (BENTYL) 10 MG capsule Take 10 mg by mouth 2 (two) times daily as needed. 09/30/21  Yes [provider]  DULoxetine (CYMBALTA) 60 MG capsule Take 60 mg by mouth daily.   Yes [provider]  etodolac (LODINE) 400 MG tablet Take 400 mg by mouth 2 (two) times daily. 03/09/22  Yes [provider]  famotidine (PEPCID) 40 MG tablet  Take 40 mg by mouth at bedtime. 03/10/22  Yes [provider]  folic acid (FOLVITE) 1 MG tablet Take 1 tablet by mouth daily. 10/21/14  Yes [provider]  galantamine (RAZADYNE) 8 MG tablet Take 8 mg by mouth daily.   Yes [provider]  levothyroxine (SYNTHROID) 50 MCG tablet Take 50 mcg by mouth daily.    Yes [provider]  methotrexate 250 MG/10ML injection ADMINISTER 0.8 ML ONCE A WEEK 02/27/20  Yes [provider]  omeprazole (PRILOSEC) 40 MG capsule Take 40 mg by mouth 2 (two) times daily. 04/20/19  Yes [provider]  senna-docusate (SENOKOT-S) 8.6-50 MG tablet Take 2 tablets by mouth 2 (two) times daily as needed for mild constipation or moderate constipation. 01/27/22  Yes Emeterio Reeve, DO  tiZANidine  (ZANAFLEX) 2 MG tablet Take 2 mg by mouth 2 (two) times daily. 12/03/19  Yes [provider]  lidocaine (LIDODERM) 5 % Place 1 patch onto the skin daily. Remove & Discard patch within 12 hours or as directed by MD Patient not taking: Reported on 04/03/2022 01/27/22   Emeterio Reeve, DO  nicotine (NICODERM CQ - DOSED IN MG/24 HOURS) 14 mg/24hr patch Place 1 patch (14 mg total) onto the skin daily. Patient not taking: Reported on 04/03/2022 01/27/22   Emeterio Reeve, DO  ondansetron (ZOFRAN-ODT) 4 MG disintegrating tablet Take 1 tablet (4 mg total) by mouth every 8 (eight) hours as needed for nausea or vomiting. Patient not taking: Reported on 01/21/2022 10/12/21   Blake Divine, MD  pantoprazole (PROTONIX) 40 MG tablet Take 1 tablet (40 mg total) by mouth 2 (two) times daily before a meal. Patient not taking: Reported on 04/03/2022 01/27/22   Emeterio Reeve, DO    Family History  Problem Relation Age of Onset   Heart disease Sister    Hypertension Brother    Heart attack Brother    Hyperlipidemia Brother    Breast cancer Neg Hx      Social History   Tobacco Use   Smoking status: Some Days    Packs/day: 0.10    Types: Cigarettes   Smokeless tobacco: Never   Tobacco comments:    Pt maybe has 1 cig a  month  Vaping Use   Vaping Use: Never used  Substance Use Topics   Alcohol use: No   Drug use: No    Allergies as of 04/02/2022 - Review Complete 04/02/2022  Allergen Reaction Noted   Other Other (See Comments) 07/12/2014   Sulfa antibiotics  11/21/2017   Denosumab Itching 12/04/2020   Etrafon [perphenazine-amitriptyline]  02/13/2015   Hydrocodone Nausea And Vomiting 11/05/2011   Mirtazapine  02/18/2015   Doxycycline Nausea Only 02/18/2015   Percocet [oxycodone-acetaminophen] Nausea And Vomiting 12/30/2014   Sertraline Palpitations 08/18/2016    Review of Systems:    All systems reviewed and negative except where noted in HPI.   Physical Exam:  Vital  signs in last 24 hours: Temp:  [97.7 F (36.5 C)-98.4 F (36.9 C)] 98.4 F (36.9 C) (10/28 0730) Pulse Rate:  [48-93] 76 (10/28 0800) Resp:  [16-22] 18 (10/28 0730) BP: (111-156)/(63-95) 131/78 (10/28 0800) SpO2:  [97 %-100 %] 97 % (10/28 0800) Weight:  [45.4 kg] 45.4 kg (10/27 1846) Last BM Date : 04/02/22 General:   Pleasant, cooperative in NAD Head:  Normocephalic and atraumatic. Eyes:   No icterus.   Conjunctiva pink. PERRLA. Ears:  Normal auditory acuity. Neck:  Supple; no masses or thyroidomegaly Lungs: Respirations even  and unlabored. Lungs clear to auscultation bilaterally.   No wheezes, crackles, or rhonchi.  Heart:  Regular rate and rhythm;  Without murmur, clicks, rubs or gallops Abdomen:  Soft, nondistended, tenderness to deep palpation in the mid abdomen.. Normal bowel sounds. No appreciable masses or hepatomegaly.  No rebound or guarding.  Rectal:  Not performed. Msk:  Symmetrical without gross deformities.    Extremities:  Without edema, cyanosis or clubbing. Neurologic:  Alert and oriented x3;  grossly normal neurologically. Skin:  Intact without significant lesions or rashes. Cervical Nodes:  No significant cervical adenopathy. Psych:  Alert and cooperative. Normal affect.  LAB RESULTS: Recent Labs    04/02/22 1848 04/03/22 0112 04/03/22 0541  WBC 8.0 8.6 7.5  HGB 7.6* 8.2* 10.1*  HCT 24.7* 26.2* 31.6*  PLT 429* 373 355   BMET Recent Labs    04/02/22 1848 04/03/22 0541  NA 134* 138  K 3.8 4.0  CL 102 109  CO2 24 25  GLUCOSE 110* 85  BUN 22 16  CREATININE 0.89 0.79  CALCIUM 8.0* 7.8*   LFT Recent Labs    04/03/22 0541  PROT 5.9*  ALBUMIN 2.9*  AST 19  ALT 10  ALKPHOS 67  BILITOT 1.1   PT/INR Recent Labs    04/02/22 2128  LABPROT 15.2  INR 1.2    STUDIES: CT ANGIO GI BLEED  Result Date: 04/02/2022 CLINICAL DATA:  Abdominal pain, nausea, lower GI bleed EXAM: CTA ABDOMEN AND PELVIS WITHOUT AND WITH CONTRAST TECHNIQUE:  Multidetector CT imaging of the abdomen and pelvis was performed using the standard protocol during bolus administration of intravenous contrast. Multiplanar reconstructed images and MIPs were obtained and reviewed to evaluate the vascular anatomy. RADIATION DOSE REDUCTION: This exam was performed according to the departmental dose-optimization program which includes automated exposure control, adjustment of the mA and/or kV according to patient size and/or use of iterative reconstruction technique. CONTRAST:  180m OMNIPAQUE IOHEXOL 350 MG/ML SOLN COMPARISON:  CT abdomen/pelvis dated 01/21/2022 FINDINGS: Lower chest: Mild branching scarring in the right lower lobe, likely post infectious inflammatory. Hepatobiliary: Liver is within normal limits. Status post cholecystectomy. No intrahepatic ductal dilatation. Mild dilatation of the common duct, likely postsurgical. Pancreas: Within normal limits. Spleen: Within normal limits. Adrenals/Urinary Tract: Adrenal glands are within normal limits. 12 mm lateral left lower pole renal cyst (series 17/image 70), benign (Bosniak I). No follow-up is recommended. Right kidney is within normal limits. No hydronephrosis. Bladder is within normal limits. Stomach/Bowel: Stomach is within normal limits. No evidence of bowel obstruction. Appendix is not discretely visualized. Extensive sigmoid diverticulosis, without evidence of diverticulitis. No finding to suggest active GI bleeding. However, there is long segment mucosal hyperenhancement involving the left colon on the portal venous phase, most pronounced at the splenic flexure and proximal descending colon. This appearance is nonspecific but at least raises the possibility of infectious/inflammatory colitis. Vascular/Lymphatic: No evidence of abdominal aortic aneurysm. Atherosclerotic calcifications of the abdominal aorta and branch vessels. Celiac artery, SMA, and IMA are patent. No suspicious abdominopelvic lymphadenopathy.  Reproductive: Uterus is within normal limits. No adnexal masses. Other: No abdominopelvic ascites. Musculoskeletal: Mild degenerative changes of the lumbar spine. Status post ORIF of the right hip. IMPRESSION: No finding to suggest active GI bleeding. Possible infectious inflammatory colitis involving the left colon, although equivocal. Extensive sigmoid diverticulosis, without evidence of diverticulitis. Additional ancillary findings as above. Electronically Signed   By: SJulian HyM.D.   On: 04/02/2022 22:31      Impression /  Plan:   Assessment: Principal Problem:   Abdominal pain Active Problems:   GERD (gastroesophageal reflux disease)   Tobacco abuse   Hypothyroid   RA (rheumatoid arthritis) (HCC)   Intractable nausea and vomiting   Colitis   Upper GI bleed   Acute blood loss anemia   [name] is a 81 y.o. y/o female with a history of a GI bleed in the past who came in with umbilical pain with a hemoglobin that was right around what her blood count was 9 days ago.  The patient was transfused and her hemoglobin went from 7.6 up to 10.1.  There has been no sign of any further bleeding.  Patient's abdominal pain is not made better or worse with anything she does such as eating or drinking.  Plan:  The plan for today will be to take the patient to the endoscopy unit for an upper endoscopy.  The differential diagnosis includes a peptic ulcer versus an AVM versus ischemic changes as a result of her previous embolization.  The patient has been explained the plan and agrees with it.  PPI IV twice daily  Continue serial CBCs and transfuse PRN Avoid NSAIDs Maintain 2 large-bore IV lines Please page GI with any acute hemodynamic changes, or signs of active GI bleeding   Thank you for involving me in the care of this patient.      LOS: 0 days   Lucilla Lame, MD, [facility] 04/03/2022, 11:49 AM,  Pager [phone] 7am-5pm  Check AMION for 5pm -7am coverage and on  weekends   Note: This dictation was prepared with Dragon dictation along with smaller phrase technology. Any transcriptional errors that result from this process are unintentional.

## 2022-04-03 NOTE — Assessment & Plan Note (Signed)
Per chart review patient noncompliant with methotrexate, we we will continue once med rec is available and patient is able to take p.o. currently patient is n.p.o.

## 2022-04-03 NOTE — Anesthesia Preprocedure Evaluation (Signed)
Anesthesia Evaluation  Patient identified by MRN, date of birth, ID band Patient awake    Reviewed: Allergy & Precautions, NPO status , Patient's Chart, lab work & pertinent test results  History of Anesthesia Complications (+) PONV and history of anesthetic complications  Airway Mallampati: III  TM Distance: >3 FB Neck ROM: full    Dental  (+) Missing   Pulmonary COPD, Current Smoker and Patient abstained from smoking.,    Pulmonary exam normal        Cardiovascular (-) angina+ Peripheral Vascular Disease  Normal cardiovascular exam     Neuro/Psych PSYCHIATRIC DISORDERS TIA   GI/Hepatic Neg liver ROS, GERD  Controlled,  Endo/Other  Hypothyroidism   Renal/GU negative Renal ROS  negative genitourinary   Musculoskeletal   Abdominal   Peds  Hematology negative hematology ROS (+)   Anesthesia Other Findings Patient is NPO appropriate and reports no nausea or vomiting today.  Past Medical History: No date: Abdominal pain No date: Anemia No date: Anxiety No date: Arthritis, rheumatoid (HCC) No date: Breast pain No date: Chest discomfort No date: Chest pain No date: Chronic pain syndrome No date: Collagen vascular disease (HCC)     Comment:  hx rheumatoid arthritis.  No date: Constipation No date: COPD (chronic obstructive pulmonary disease) (HCC) No date: Diverticulosis No date: Fibrocystic breast disease No date: Gastritis No date: GERD (gastroesophageal reflux disease) No date: Hemorrhoids No date: Hx of colonic polyps No date: Hypothyroidism No date: Osteopenia No date: PONV (postoperative nausea and vomiting) No date: Renal artery stenosis (HCC) No date: Right ankle pain No date: Thyroid binding globulin deficiency No date: Tobacco abuse  Past Surgical History: No date: CHOLECYSTECTOMY 01/22/2022: EMBOLIZATION; N/A     Comment:  Procedure: EMBOLIZATION;  Surgeon: Katha Cabal,                MD;  Location: [place];  Service:               Cardiovascular;  Laterality: N/A; 01/22/2022: ESOPHAGOGASTRODUODENOSCOPY (EGD) WITH PROPOFOL; N/A     Comment:  Procedure: ESOPHAGOGASTRODUODENOSCOPY (EGD) WITH               PROPOFOL;  Surgeon: Lucilla Lame, MD;  Location: [facility]               ENDOSCOPY;  Service: Endoscopy;  Laterality: N/A; No date: JOINT REPLACEMENT     Comment:  right hip No date: LEG SURGERY No date: TONSILLECTOMY  BMI    Body Mass Index: 17.16 kg/m      Reproductive/Obstetrics negative OB ROS                             Anesthesia Physical Anesthesia Plan  ASA: 3  Anesthesia Plan: General   Post-op Pain Management:    Induction: Intravenous  PONV Risk Score and Plan: Propofol infusion and TIVA  Airway Management Planned: Natural Airway and Nasal Cannula  Additional Equipment:   Intra-op Plan:   Post-operative Plan:   Informed Consent: I have reviewed the patients History and Physical, chart, labs and discussed the procedure including the risks, benefits and alternatives for the proposed anesthesia with the patient or authorized representative who has indicated his/her understanding and acceptance.     Dental Advisory Given  Plan Discussed with: Anesthesiologist, CRNA and Surgeon  Anesthesia Plan Comments: (Patient consented for risks of anesthesia including but not limited to:  - adverse reactions to medications -  risk of airway placement if required - damage to eyes, teeth, lips or other oral mucosa - nerve damage due to positioning  - sore throat or hoarseness - Damage to heart, brain, nerves, lungs, other parts of body or loss of life  Patient voiced understanding.)        Anesthesia Quick Evaluation

## 2022-04-03 NOTE — Assessment & Plan Note (Signed)
Colitis on CT scan. Less likely. We will continue patient on IV PPI therapy and IV fluids.

## 2022-04-03 NOTE — Assessment & Plan Note (Signed)
Secondary to her acute on chronic GI blood loss. EGD shows no active bleeding.  2 PRBC transfusion on admission.  Continue Protonix 40 mg IV twice daily.  Monitor H&H     Latest Ref Rng & Units 04/03/2022    5:41 AM 04/03/2022    1:12 AM 04/02/2022    6:48 PM  CBC  WBC 4.0 - 10.5 K/uL 7.5  8.6  8.0   Hemoglobin 12.0 - 15.0 g/dL 10.1  8.2  7.6   Hematocrit 36.0 - 46.0 % 31.6  26.2  24.7   Platelets 150 - 400 K/uL 355  373  429

## 2022-04-03 NOTE — Op Note (Signed)
[facility] Gastroenterology Patient Name: [name] Procedure Date: 04/03/2022 11:23 AM MRN: [id number] Account #: [id number] Date of Birth: 01-05-1941 Admit Type: Inpatient Age: 81 Room: [facility] ENDO ROOM 4 Gender: Female Note Status: Finalized Instrument Name: Upper Endoscope [id number] Procedure:             Upper GI endoscopy Indications:           Melena Providers:             Lucilla Lame MD, MD Referring MD:          Rusty Aus, MD (Referring MD) Medicines:             Propofol per Anesthesia Complications:         No immediate complications. Procedure:             Pre-Anesthesia Assessment:                        - Prior to the procedure, a History and Physical was                         performed, and patient medications and allergies were                         reviewed. The patient's tolerance of previous                         anesthesia was also reviewed. The risks and benefits                         of the procedure and the sedation options and risks                         were discussed with the patient. All questions were                         answered, and informed consent was obtained. Prior                         Anticoagulants: The patient has taken no anticoagulant                         or antiplatelet agents. ASA Grade Assessment: II - A                         patient with mild systemic disease. After reviewing                         the risks and benefits, the patient was deemed in                         satisfactory condition to undergo the procedure.                        After obtaining informed consent, the endoscope was                         passed under direct vision. Throughout the procedure,  the patient's blood pressure, pulse, and oxygen                         saturations were monitored continuously. The Endoscope                         was introduced through the mouth, and advanced to  the                         fourth part of duodenum. The upper GI endoscopy was                         accomplished without difficulty. The patient tolerated                         the procedure well. Findings:      The examined esophagus was normal.      Diffuse mild inflammation characterized by friability was found in the       entire examined stomach.      The examined duodenum was normal. Impression:            - Normal esophagus.                        - Gastritis.                        - Normal examined duodenum.                        - No specimens collected.                        - No old blood or fresh blood seen. Recommendation:        - Return patient to hospital ward for ongoing care.                        - Resume previous diet.                        - Continue present medications.                        - No source of bleeding seen. Procedure Code(s):     --- Professional ---                        [phone], Esophagogastroduodenoscopy, flexible,                         transoral; diagnostic, including collection of                         specimen(s) by brushing or washing, when performed                         (separate procedure) Diagnosis Code(s):     --- Professional ---                        K92.1, Melena (includes Hematochezia)  K29.70, Gastritis, unspecified, without bleeding CPT copyright 2022 American Medical Association. All rights reserved. The codes documented in this report are preliminary and upon coder review may  be revised to meet current compliance requirements. Lucilla Lame MD, MD 04/03/2022 12:51:36 PM This report has been signed electronically. Number of Addenda: 0 Note Initiated On: 04/03/2022 11:23 AM Estimated Blood Loss:  Estimated blood loss: none.      [facility]

## 2022-04-04 DIAGNOSIS — K529 Noninfective gastroenteritis and colitis, unspecified: Secondary | ICD-10-CM | POA: Diagnosis not present

## 2022-04-04 DIAGNOSIS — D62 Acute posthemorrhagic anemia: Secondary | ICD-10-CM | POA: Diagnosis not present

## 2022-04-04 DIAGNOSIS — K922 Gastrointestinal hemorrhage, unspecified: Secondary | ICD-10-CM

## 2022-04-04 LAB — CBC
HCT: 34.5 % — ABNORMAL LOW (ref 36.0–46.0)
Hemoglobin: 11.1 g/dL — ABNORMAL LOW (ref 12.0–15.0)
MCH: 28.3 pg (ref 26.0–34.0)
MCHC: 32.2 g/dL (ref 30.0–36.0)
MCV: 88 fL (ref 80.0–100.0)
Platelets: 414 K/uL — ABNORMAL HIGH (ref 150–400)
RBC: 3.92 MIL/uL (ref 3.87–5.11)
RDW: 18.2 % — ABNORMAL HIGH (ref 11.5–15.5)
WBC: 7.2 K/uL (ref 4.0–10.5)
nRBC: 0 % (ref 0.0–0.2)

## 2022-04-04 LAB — TYPE AND SCREEN
ABO/RH(D): O POS
Antibody Screen: NEGATIVE
Unit division: 0
Unit division: 0

## 2022-04-04 LAB — BASIC METABOLIC PANEL WITH GFR
Anion gap: 6 (ref 5–15)
BUN: 16 mg/dL (ref 8–23)
CO2: 25 mmol/L (ref 22–32)
Calcium: 8.3 mg/dL — ABNORMAL LOW (ref 8.9–10.3)
Chloride: 107 mmol/L (ref 98–111)
Creatinine, Ser: 1.03 mg/dL — ABNORMAL HIGH (ref 0.44–1.00)
GFR, Estimated: 55 mL/min — ABNORMAL LOW
Glucose, Bld: 103 mg/dL — ABNORMAL HIGH (ref 70–99)
Potassium: 3.9 mmol/L (ref 3.5–5.1)
Sodium: 138 mmol/L (ref 135–145)

## 2022-04-04 LAB — BPAM RBC
Blood Product Expiration Date: 202311262359
Blood Product Expiration Date: 202311282359
ISSUE DATE / TIME: 202310272247
ISSUE DATE / TIME: 202310280220
Unit Type and Rh: 5100
Unit Type and Rh: 5100

## 2022-04-05 ENCOUNTER — Encounter: Payer: Self-pay | Admitting: Gastroenterology

## 2022-04-06 ENCOUNTER — Ambulatory Visit: Payer: Medicare Other | Admitting: Gastroenterology

## 2022-04-07 NOTE — Discharge Summary (Signed)
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  EGD shows gastritis but no bleeding   Assessment and Plan: * Abdominal pain Could be due to gastritis seen on EGD.  Continue PPI. Pain improving  GI bleed Patient found to have anemia with a hemoglobin of 7.6 suspect secondary to acute blood loss anemia due to GI bleed. Status post 2 PRBC transfusion EGD showed gastritis but no active bleeding. No further bleeding while in the hospital and H & H remained stable  Intractable nausea and vomiting No more nausea vomiting reported. Tolerated diet.  GERD (gastroesophageal reflux disease) Continue Protonix 40 mg twice daily  Colitis Colitis on CT scan. Clinically seems less likely.  She is afebrile She is not interested in taking Antibiotics for now.  RA (rheumatoid arthritis) ([facility]) Hold medications for now. Per chart review patient noncompliant with methotrexate  Hypothyroid Continue levothyroxine at 50 mcg.  Tobacco abuse Counseled  Anemia associated with acute blood loss Secondary to her acute on chronic GI blood loss. EGD shows no active bleeding.  2 PRBC transfusion & stable H & H         Consultants: GI Procedures performed: EGD on 10/28  Disposition: Home Diet recommendation:  Discharge Diet Orders (From admission, onward)     Start     Ordered   04/04/22 0000  Diet - low sodium heart healthy        04/04/22 1007           Carb modified diet DISCHARGE MEDICATION: Allergies as of 04/04/2022       Reactions   Other Other (See Comments)   Uncoded Allergy. Allergen: symmetral Uncoded Allergy. Allergen: etrafon Uncoded Allergy. Allergen: desbutal Uncoded Allergy. Allergen: symmetral Uncoded Allergy. Allergen: etrafon Uncoded Allergy. Allergen: desbutal Other reaction(s): Unknown Uncoded Allergy. Allergen: symmetral Other reaction(s): Unknown Uncoded Allergy. Allergen: etrafon Uncoded Allergy. Allergen: symmetral Uncoded Allergy. Allergen: etrafon Uncoded Allergy. Allergen: desbutal   Sulfa Antibiotics    Other reaction(s): Unknown Uncoded Allergy. Allergen: desbutal   Etrafon [perphenazine-amitriptyline]    Hydrocodone Nausea And Vomiting   Mirtazapine    Other reaction(s): Hallucination   Denosumab Itching   Doxycycline Nausea Only   Oxycodone-acetaminophen Nausea And Vomiting   Other reaction(s): Other (See Comments)   Percocet [oxycodone-acetaminophen] Nausea And Vomiting   Sertraline Palpitations        Medication List     STOP taking these medications    famotidine 40 MG tablet Commonly known as: PEPCID   lidocaine 5 % Commonly known as: LIDODERM   nicotine 14 mg/24hr patch Commonly known as: NICODERM CQ - dosed in mg/24 hours   ondansetron 4 MG disintegrating tablet Commonly known as: ZOFRAN-ODT   pantoprazole 40 MG  tablet Commonly known as: PROTONIX   predniSONE 10 MG tablet Commonly known as: DELTASONE       TAKE these medications    atorvastatin 10 MG tablet Commonly known as: LIPITOR Take 10 mg by mouth daily.   buPROPion 150 MG 24 hr tablet Commonly known as: WELLBUTRIN XL Take 150 mg by mouth daily.    dicyclomine 10 MG capsule Commonly known as: BENTYL Take 10 mg by mouth 2 (two) times daily as needed.   DULoxetine 60 MG capsule Commonly known as: CYMBALTA Take 60 mg by mouth daily.   etodolac 400 MG tablet Commonly known as: LODINE Take 400 mg by mouth 2 (two) times daily.   folic acid 1 MG tablet Commonly known as: FOLVITE Take 1 tablet by mouth daily.   galantamine 8 MG tablet Commonly known as: RAZADYNE Take 8 mg by mouth daily.   levothyroxine 50 MCG tablet Commonly known as: SYNTHROID Take 50 mcg by mouth daily.   methotrexate 250 MG/10ML injection ADMINISTER 0.8 ML ONCE A WEEK   omeprazole 40 MG capsule Commonly known as: PRILOSEC Take 40 mg by mouth 2 (two) times daily.   senna-docusate 8.6-50 MG tablet Commonly known as: Senokot-S Take 2 tablets by mouth 2 (two) times daily as needed for mild constipation or moderate constipation.   tiZANidine 2 MG tablet Commonly known as: ZANAFLEX Take 2 mg by mouth 2 (two) times daily.        Follow-up Information     [place], Benay Pike, MD. Schedule an appointment as soon as possible for a visit in 2 week(s).   Specialty: Gastroenterology Why: [facility] Discharge F/UP Contact information: [place] [phone]         Rusty Aus, MD. Schedule an appointment as soon as possible for a visit in 1 week(s).   Specialty: Internal Medicine Why: [facility] Discharge F/UP Contact information: [facility] [place] [address] [phone]                Discharge Exam: Danley Danker Weights   04/02/22 1846 04/03/22 1331  Weight: 45.4 kg 38.55 kg   81 year old female lying in the bed comfortably without any acute distress Lungs clear to auscultation bilaterally Cardiovascular regular rate and rhythm Abdomen soft, benign Neuro alert and awake, nonfocal  Condition at discharge: good  The results of significant diagnostics from this  hospitalization (including imaging, microbiology, ancillary and laboratory) are listed below for reference.   Imaging Studies: CT ANGIO GI BLEED  Result Date: 04/02/2022 CLINICAL DATA:  Abdominal pain, nausea, lower GI bleed EXAM: CTA ABDOMEN AND PELVIS WITHOUT AND WITH CONTRAST TECHNIQUE: Multidetector CT imaging of the abdomen and pelvis was performed using the standard protocol during bolus administration of intravenous contrast. Multiplanar reconstructed images and MIPs were obtained and reviewed to evaluate the vascular anatomy. RADIATION DOSE REDUCTION: This exam was performed according to the departmental dose-optimization program which includes automated exposure control, adjustment of the mA and/or kV according to patient size and/or use of iterative reconstruction technique. CONTRAST:  162m OMNIPAQUE IOHEXOL 350 MG/ML SOLN COMPARISON:  CT abdomen/pelvis dated 01/21/2022 FINDINGS: Lower chest: Mild branching scarring in the right lower lobe, likely post infectious inflammatory. Hepatobiliary: Liver is within normal limits. Status post cholecystectomy. No intrahepatic ductal dilatation. Mild dilatation of the common duct, likely postsurgical. Pancreas: Within normal limits. Spleen: Within normal limits. Adrenals/Urinary Tract: Adrenal glands are within normal limits. 12 mm lateral left lower pole renal cyst (  series 17/image 70), benign (Bosniak I). No follow-up is recommended. Right kidney is within normal limits. No hydronephrosis. Bladder is within normal limits. Stomach/Bowel: Stomach is within normal limits. No evidence of bowel obstruction. Appendix is not discretely visualized. Extensive sigmoid diverticulosis, without evidence of diverticulitis. No finding to suggest active GI bleeding. However, there is long segment mucosal hyperenhancement involving the left colon on the portal venous phase, most pronounced at the splenic flexure and proximal descending colon. This appearance is nonspecific  but at least raises the possibility of infectious/inflammatory colitis. Vascular/Lymphatic: No evidence of abdominal aortic aneurysm. Atherosclerotic calcifications of the abdominal aorta and branch vessels. Celiac artery, SMA, and IMA are patent. No suspicious abdominopelvic lymphadenopathy. Reproductive: Uterus is within normal limits. No adnexal masses. Other: No abdominopelvic ascites. Musculoskeletal: Mild degenerative changes of the lumbar spine. Status post ORIF of the right hip. IMPRESSION: No finding to suggest active GI bleeding. Possible infectious inflammatory colitis involving the left colon, although equivocal. Extensive sigmoid diverticulosis, without evidence of diverticulitis. Additional ancillary findings as above. Electronically Signed   By: Julian Hy M.D.   On: 04/02/2022 22:31   MM 3D SCREEN BREAST BILATERAL  Result Date: 04/01/2022 CLINICAL DATA:  Screening. EXAM: DIGITAL SCREENING BILATERAL MAMMOGRAM WITH TOMOSYNTHESIS AND CAD TECHNIQUE: Bilateral screening digital craniocaudal and mediolateral oblique mammograms were obtained. Bilateral screening digital breast tomosynthesis was performed. The images were evaluated with computer-aided detection. COMPARISON:  Previous exam(s). ACR Breast Density Category c: The breast tissue is heterogeneously dense, which may obscure small masses. FINDINGS: There are no findings suspicious for malignancy. IMPRESSION: No mammographic evidence of malignancy. A result letter of this screening mammogram will be mailed directly to the patient. RECOMMENDATION: Screening mammogram in one year. (Code:SM-B-01Y) BI-RADS CATEGORY  1: Negative. Electronically Signed   By: Margarette Canada M.D.   On: 04/01/2022 08:58    Microbiology: Results for orders placed or performed during the hospital encounter of 01/21/22  Blood Culture (routine x 2)     Status: None   Collection Time: 01/21/22  3:17 PM   Specimen: BLOOD  Result Value Ref Range Status   Specimen  Description BLOOD BLOOD LEFT FOREARM  Final   Special Requests   Final    BOTTLES DRAWN AEROBIC AND ANAEROBIC Blood Culture adequate volume   Culture   Final    NO GROWTH 6 DAYS Performed at [facility], [address] [place], [place] [address]    Report Status 01/27/2022 FINAL  Final  Blood Culture (routine x 2)     Status: None   Collection Time: 01/21/22  3:17 PM   Specimen: BLOOD  Result Value Ref Range Status   Specimen Description BLOOD BLOOD LEFT HAND  Final   Special Requests   Final    BOTTLES DRAWN AEROBIC AND ANAEROBIC Blood Culture results may not be optimal due to an excessive volume of blood received in culture bottles   Culture   Final    NO GROWTH 6 DAYS Performed at [facility], [address], [place], [place] [address]    Report Status 01/27/2022 FINAL  Final  MRSA Next Gen by PCR, Nasal     Status: None   Collection Time: 01/21/22  8:26 PM   Specimen: Nasal Mucosa; Nasal Swab  Result Value Ref Range Status   MRSA by PCR Next Gen NOT DETECTED NOT DETECTED Final    Comment: (NOTE) The GeneXpert MRSA Assay (FDA approved for NASAL specimens only), is one component of a comprehensive MRSA  colonization surveillance program. It is not intended to diagnose MRSA infection nor to guide or monitor treatment for MRSA infections. Test performance is not FDA approved in patients less than 40 years old. Performed at [facility], [address], [place], [place] [address]   Urine Culture     Status: None   Collection Time: 01/22/22  6:01 AM   Specimen: Urine, Random  Result Value Ref Range Status   Specimen Description   Final    URINE, RANDOM Performed at [facility], [address] [place], [place] [address]    Special Requests   Final    NONE Performed at [facility], [address] [place], [place] [address]    Culture   Final    NO GROWTH Performed at [facility], [place] [address]  [place], [place] [address]    Report Status 01/23/2022 FINAL  Final    Labs: CBC: Recent Labs  Lab 04/02/22 1848 04/03/22 0112 04/03/22 0541 04/04/22 0500  WBC 8.0 8.6 7.5 7.2  HGB 7.6* 8.2* 10.1* 11.1*  HCT 24.7* 26.2* 31.6* 34.5*  MCV 95.0 90.0 90.0 88.0  PLT 429* 373 355 916*   Basic Metabolic Panel: Recent Labs  Lab 04/02/22 1848 04/03/22 0541 04/04/22 0500  NA 134* 138 138  K 3.8 4.0 3.9  CL 102 109 107  CO2 '24 25 25  '$ GLUCOSE 110* 85 103*  BUN '22 16 16  '$ CREATININE 0.89 0.79 1.03*  CALCIUM 8.0* 7.8* 8.3*   Liver Function Tests: Recent Labs  Lab 04/02/22 1848 04/03/22 0541  AST 21 19  ALT 12 10  ALKPHOS 79 67  BILITOT 0.6 1.1  PROT 7.1 5.9*  ALBUMIN 3.4* 2.9*   CBG: No results for input(s): "GLUCAP" in the last 168 hours.  Discharge time spent: greater than 30 minutes.  Signed: Max Sane, MD [facility] 04/07/2022

## 2022-06-03 ENCOUNTER — Ambulatory Visit: Payer: Medicare Other | Admitting: Gastroenterology

## 2022-06-16 ENCOUNTER — Other Ambulatory Visit: Payer: Self-pay | Admitting: Internal Medicine

## 2022-06-16 ENCOUNTER — Ambulatory Visit
Admission: RE | Admit: 2022-06-16 | Discharge: 2022-06-16 | Disposition: A | Discharge status: Home / Self Care | Payer: Medicare Other | Source: Ambulatory Visit | Attending: Internal Medicine | Admitting: Internal Medicine

## 2022-06-16 ENCOUNTER — Encounter: Payer: Self-pay | Admitting: Oncology

## 2022-06-16 DIAGNOSIS — S32010A Wedge compression fracture of first lumbar vertebra, initial encounter for closed fracture: Secondary | ICD-10-CM

## 2022-06-18 ENCOUNTER — Other Ambulatory Visit: Payer: Self-pay | Admitting: Internal Medicine

## 2022-06-18 DIAGNOSIS — M4856XA Collapsed vertebra, not elsewhere classified, lumbar region, initial encounter for fracture: Secondary | ICD-10-CM

## 2022-06-30 ENCOUNTER — Inpatient Hospital Stay (HOSPITAL_BASED_OUTPATIENT_CLINIC_OR_DEPARTMENT_OTHER): Payer: Medicare Other | Admitting: Oncology

## 2022-06-30 ENCOUNTER — Inpatient Hospital Stay: Payer: Medicare Other | Attending: Oncology

## 2022-06-30 ENCOUNTER — Encounter: Payer: Self-pay | Admitting: Oncology

## 2022-06-30 VITALS — BP 117/67 | HR 82 | Resp 18 | Ht 64.0 in | Wt 89.0 lb

## 2022-06-30 DIAGNOSIS — M069 Rheumatoid arthritis, unspecified: Secondary | ICD-10-CM | POA: Diagnosis not present

## 2022-06-30 DIAGNOSIS — D509 Iron deficiency anemia, unspecified: Secondary | ICD-10-CM | POA: Diagnosis not present

## 2022-06-30 DIAGNOSIS — Z79899 Other long term (current) drug therapy: Secondary | ICD-10-CM | POA: Diagnosis not present

## 2022-06-30 LAB — FERRITIN: Ferritin: 28 ng/mL (ref 11–307)

## 2022-06-30 LAB — CBC WITH DIFFERENTIAL/PLATELET
Abs Immature Granulocytes: 0.04 10*3/uL (ref 0.00–0.07)
Basophils Absolute: 0 10*3/uL (ref 0.0–0.1)
Basophils Relative: 1 %
Eosinophils Absolute: 0.2 10*3/uL (ref 0.0–0.5)
Eosinophils Relative: 2 %
HCT: 35.8 % — ABNORMAL LOW (ref 36.0–46.0)
Hemoglobin: 11.3 g/dL — ABNORMAL LOW (ref 12.0–15.0)
Immature Granulocytes: 1 %
Lymphocytes Relative: 17 %
Lymphs Abs: 1.4 10*3/uL (ref 0.7–4.0)
MCH: 30.9 pg (ref 26.0–34.0)
MCHC: 31.6 g/dL (ref 30.0–36.0)
MCV: 97.8 fL (ref 80.0–100.0)
Monocytes Absolute: 0.5 10*3/uL (ref 0.1–1.0)
Monocytes Relative: 7 %
Neutro Abs: 5.9 10*3/uL (ref 1.7–7.7)
Neutrophils Relative %: 72 %
Platelets: 329 10*3/uL (ref 150–400)
RBC: 3.66 MIL/uL — ABNORMAL LOW (ref 3.87–5.11)
RDW: 16.1 % — ABNORMAL HIGH (ref 11.5–15.5)
WBC: 8 10*3/uL (ref 4.0–10.5)
nRBC: 0 % (ref 0.0–0.2)

## 2022-06-30 LAB — IRON AND TIBC
Iron: 46 ug/dL (ref 28–170)
Saturation Ratios: 12 % (ref 10.4–31.8)
TIBC: 372 ug/dL (ref 250–450)
UIBC: 326 ug/dL

## 2022-07-01 ENCOUNTER — Encounter: Payer: Self-pay | Admitting: Oncology

## 2022-07-01 NOTE — Progress Notes (Signed)
Hematology/Oncology Consult note [facility]  [phone] [phone]  Patient Care Team: Rusty Aus, MD as PCP - General (Unknown Physician Specialty)   Name of the patient: [name]  [id number]  1941-02-03   Date of visit: 07/01/22  Diagnosis-anemia of chronic disease as well as component of iron deficiency  Chief complaint/ Reason for visit-routine follow-up of anemia  Heme/Onc history: patient is a 82 year old female with a past medical history significant for hypertension hypothyroidism hyperlipidemia rheumatoid arthritis among other medical problems.  She has been referred for iron deficiency anemia.  Her recent CBC from 10/12/2021 showed white count of 5.2, H&H of 8/26.3 with an MCV of 86 and a platelet count of 198.  Folate levels were normal.  Iron studies showed a low iron saturation of 6%.  Reticulocyte count was low at 1.4%.  B12 levels normal at 368.  Looking back at her prior CBCs patient's baseline hemoglobin runs around 12 up until July 2022 and then started gradually drifting down to the tens in October 2022.  No recent ferritin levels checked.  TSH normal at 4.7.   Labs were suggestive of iron deficiency and patient received 2 doses of Feraheme in May 2023  Interval history-patient continues to have issues with joint pain and deformity secondary to rheumatoid arthritis but is otherwise doing well for her age.  No recent hospitalizations  ECOG PS- 1 Pain scale- 4 Opioid associated constipation- no  Review of systems- Review of Systems  Constitutional:  Negative for chills, fever, malaise/fatigue and weight loss.  HENT:  Negative for congestion, ear discharge and nosebleeds.   Eyes:  Negative for blurred vision.  Respiratory:  Negative for cough, hemoptysis, sputum production, shortness of breath and wheezing.   Cardiovascular:  Negative for chest pain, palpitations, orthopnea and claudication.  Gastrointestinal:   Negative for abdominal pain, blood in stool, constipation, diarrhea, heartburn, melena, nausea and vomiting.  Genitourinary:  Negative for dysuria, flank pain, frequency, hematuria and urgency.  Musculoskeletal:  Positive for joint pain. Negative for back pain and myalgias.  Skin:  Negative for rash.  Neurological:  Negative for dizziness, tingling, focal weakness, seizures, weakness and headaches.  Endo/Heme/Allergies:  Does not bruise/bleed easily.  Psychiatric/Behavioral:  Negative for depression and suicidal ideas. The patient does not have insomnia.       Allergies  Allergen Reactions   Other Other (See Comments)    Uncoded Allergy. Allergen: symmetral Uncoded Allergy. Allergen: etrafon Uncoded Allergy. Allergen: desbutal Uncoded Allergy. Allergen: symmetral Uncoded Allergy. Allergen: etrafon Uncoded Allergy. Allergen: desbutal Other reaction(s): Unknown Uncoded Allergy. Allergen: symmetral Other reaction(s): Unknown Uncoded Allergy. Allergen: etrafon Uncoded Allergy. Allergen: symmetral Uncoded Allergy. Allergen: etrafon Uncoded Allergy. Allergen: desbutal    Sulfa Antibiotics     Other reaction(s): Unknown Uncoded Allergy. Allergen: desbutal   Etrafon [Perphenazine-Amitriptyline]    Hydrocodone Nausea And Vomiting   Mirtazapine     Other reaction(s): Hallucination   Denosumab Itching   Doxycycline Nausea Only   Oxycodone-Acetaminophen Nausea And Vomiting and Other (See Comments)    Other reaction(s): Other (See Comments)   Percocet [Oxycodone-Acetaminophen] Nausea And Vomiting   Sertraline Palpitations     Past Medical History:  Diagnosis Date   Abdominal pain    Anemia    Anxiety    Arthritis, rheumatoid (HCC)    Breast pain    Chest discomfort    Chest pain    Chronic pain syndrome    Collagen vascular disease ([place])  hx rheumatoid arthritis.    Constipation    COPD (chronic obstructive pulmonary disease) (HCC)    Diverticulosis    Fibrocystic  breast disease    Gastritis    GERD (gastroesophageal reflux disease)    Hemorrhoids    Hx of colonic polyps    Hypothyroidism    Osteopenia    PONV (postoperative nausea and vomiting)    Renal artery stenosis (HCC)    Right ankle pain    Thyroid binding globulin deficiency    Tobacco abuse      Past Surgical History:  Procedure Laterality Date   CHOLECYSTECTOMY     EMBOLIZATION N/A 01/22/2022   Procedure: EMBOLIZATION;  Surgeon: Katha Cabal, MD;  Location: [facility];  Service: Cardiovascular;  Laterality: N/A;   ESOPHAGOGASTRODUODENOSCOPY (EGD) WITH PROPOFOL N/A 01/22/2022   Procedure: ESOPHAGOGASTRODUODENOSCOPY (EGD) WITH PROPOFOL;  Surgeon: Lucilla Lame, MD;  Location: [facility];  Service: Endoscopy;  Laterality: N/A;   ESOPHAGOGASTRODUODENOSCOPY (EGD) WITH PROPOFOL N/A 04/03/2022   Procedure: ESOPHAGOGASTRODUODENOSCOPY (EGD) WITH PROPOFOL;  Surgeon: Lucilla Lame, MD;  Location: [facility];  Service: Endoscopy;  Laterality: N/A;   JOINT REPLACEMENT     right hip   LEG SURGERY     TONSILLECTOMY      Social History   Socioeconomic History   Marital status: Single    Spouse name: Not on file   Number of children: Not on file   Years of education: Not on file   Highest education level: Not on file  Occupational History   Not on file  Tobacco Use   Smoking status: Some Days    Packs/day: 0.10    Types: Cigarettes   Smokeless tobacco: Never   Tobacco comments:    Pt maybe has 1 cig a  month  Vaping Use   Vaping Use: Never used  Substance and Sexual Activity   Alcohol use: No   Drug use: No   Sexual activity: Not Currently  Other Topics Concern   Not on file  Social History Narrative   Not on file   Social Determinants of Health   Financial Resource Strain: Not on file  Food Insecurity: No Food Insecurity (04/03/2022)   Hunger Vital Sign    Worried About Running Out of Food in the Last Year: Never true    Ran Out of Food in the Last  Year: Never true  Transportation Needs: No Transportation Needs (04/03/2022)   PRAPARE - Hydrologist (Medical): No    Lack of Transportation (Non-Medical): No  Physical Activity: Not on file  Stress: Not on file  Social Connections: Not on file  Intimate Partner Violence: Not At Risk (04/03/2022)   Humiliation, Afraid, Rape, and Kick questionnaire    Fear of Current or Ex-Partner: No    Emotionally Abused: No    Physically Abused: No    Sexually Abused: No    Family History  Problem Relation Age of Onset   Heart disease Sister    Hypertension Brother    Heart attack Brother    Hyperlipidemia Brother    Breast cancer Neg Hx      Current Outpatient Medications:    acetaminophen-codeine (TYLENOL #3) 300-30 MG tablet, Take 1 tablet by mouth every 6 (six) hours as needed., Disp: , Rfl:    ASPIRIN 81 PO, 1 tablet DAILY (route: oral), Disp: , Rfl:    atorvastatin (LIPITOR) 10 MG tablet, Take 10 mg by mouth daily., Disp: , Rfl:  buPROPion (WELLBUTRIN XL) 150 MG 24 hr tablet, Take 150 mg by mouth daily., Disp: , Rfl:    folic acid (FOLVITE) 1 MG tablet, Take 1 tablet by mouth daily., Disp: , Rfl:    galantamine (RAZADYNE) 8 MG tablet, Take 8 mg by mouth daily., Disp: , Rfl:    levothyroxine (SYNTHROID) 50 MCG tablet, Take 50 mcg by mouth daily. , Disp: , Rfl:    lidocaine (LIDODERM) 5 %, APPLY ONE PATCH TOPICALLY ONE TIME DAILY; REMOVE AND DISCARD PATCH WITHIN 12 HOURS OR AS DIRECTED BY MD, Disp: , Rfl:    methotrexate 250 MG/10ML injection, ADMINISTER 0.8 ML ONCE A WEEK, Disp: , Rfl:    omeprazole (PRILOSEC) 40 MG capsule, Take 40 mg by mouth 2 (two) times daily., Disp: , Rfl:    senna-docusate (SENOKOT-S) 8.6-50 MG tablet, Take 2 tablets by mouth 2 (two) times daily as needed for mild constipation or moderate constipation., Disp: 30 tablet, Rfl: 0   tiZANidine (ZANAFLEX) 2 MG tablet, Take 2 mg by mouth 2 (two) times daily., Disp: , Rfl:    dicyclomine  (BENTYL) 10 MG capsule, Take 10 mg by mouth 2 (two) times daily as needed. (Patient not taking: Reported on 06/30/2022), Disp: , Rfl:    DULoxetine (CYMBALTA) 60 MG capsule, Take 60 mg by mouth daily. (Patient not taking: Reported on 06/30/2022), Disp: , Rfl:    etodolac (LODINE) 400 MG tablet, Take 400 mg by mouth 2 (two) times daily. (Patient not taking: Reported on 06/30/2022), Disp: , Rfl:   Physical exam:  Vitals:   06/30/22 1414 06/30/22 1426 06/30/22 1428  BP:   117/67  Pulse:  82   Resp: 18 18   SpO2:  100%   Weight:  89 lb (40.4 kg)   Height:  '5\' 4"'$  (1.626 m)    Physical Exam Cardiovascular:     Rate and Rhythm: Normal rate and regular rhythm.     Heart sounds: Normal heart sounds.  Pulmonary:     Effort: Pulmonary effort is normal.     Breath sounds: Normal breath sounds.  Abdominal:     General: Bowel sounds are normal.     Palpations: Abdomen is soft.  Musculoskeletal:     Comments: Bilateral chronic hand deformities  Skin:    General: Skin is warm and dry.  Neurological:     Mental Status: She is alert and oriented to person, place, and time.         Latest Ref Rng & Units 04/04/2022    5:00 AM  CMP  Glucose 70 - 99 mg/dL 103   BUN 8 - 23 mg/dL 16   Creatinine 0.44 - 1.00 mg/dL 1.03   Sodium 135 - 145 mmol/L 138   Potassium 3.5 - 5.1 mmol/L 3.9   Chloride 98 - 111 mmol/L 107   CO2 22 - 32 mmol/L 25   Calcium 8.9 - 10.3 mg/dL 8.3       Latest Ref Rng & Units 06/30/2022    1:51 PM  CBC  WBC 4.0 - 10.5 K/uL 8.0   Hemoglobin 12.0 - 15.0 g/dL 11.3   Hematocrit 36.0 - 46.0 % 35.8   Platelets 150 - 400 K/uL 329     No images are attached to the encounter.  MR LUMBAR SPINE WO CONTRAST  Result Date: 06/16/2022 CLINICAL DATA:  Upper lumbar compression fracture EXAM: MRI LUMBAR SPINE WITHOUT CONTRAST TECHNIQUE: Multiplanar, multisequence MR imaging of the lumbar spine was performed. No intravenous contrast was  administered. COMPARISON:  09/14/2017 and CT  abdomen from 04/02/2022 FINDINGS: Segmentation: The lowest lumbar type non-rib-bearing vertebra is labeled as L5. Alignment: 3 mm degenerative retrolisthesis at L2-3, similar to previous. Dextroconvex lumbar scoliosis with rotary component. Vertebrae: Interval 30% superior endplate compression fracture at L2 with 2-3 mm of posterior bony retropulsion indicating middle column involvement along the posterosuperior endplate. Mild associated marrow edema. Stable hemangioma in the L1 vertebral body. No posterior element edema observed. Intervertebral disc desiccation is observed at all levels between L2 and L5. Conus medullaris and cauda equina: Conus extends to the L1 level. Conus and cauda equina appear normal. Paraspinal and other soft tissues: A fluid signal intensity lesion of the left kidney is partially characterized on today's exam. This is statistically likely to be a cyst but not technically specific. No further imaging workup of this lesion is indicated. Disc levels: T12-L1: Unremarkable L1-2: No impingement. Disc bulge and posterior bony retropulsion from the posterosuperior endplate at L2. L2-3: No impingement.  Mild disc bulge. L3-4: Mild bilateral subarticular lateral recess stenosis and borderline central narrowing of the thecal sac due to disc bulge and facet arthropathy. Small synovial cyst posterior to the left facet joint. Similar appearance to previous. L4-5: Mild bilateral subarticular lateral recess stenosis due to disc bulge and right lateral recess disc protrusion, similar to previous. L5-S1: IMPRESSION: 1. Interval 30% superior endplate compression fracture at L2 with 2-3 mm of posterior bony retropulsion from the posterosuperior endplate. No impingement at this level. 2. Lumbar spondylosis and degenerative disc disease causing mild impingement at L3-4 and L4-5. 3. Dextroconvex lumbar scoliosis with rotary component. Electronically Signed   By: Van Clines M.D.   On: 06/16/2022 18:34      Assessment and plan- Patient is a 82 y.o. female here for routine follow-up of anemia of chronic disease as well as component of  iron deficiency  Patient's baseline hemoglobin was around 12 up until 2021.  It then drifted down between 9-10 last year.  Presently it is 11.3 and has remained stable over the last 4 months.  Iron studies were not back at the time of patient visit but after she left she was found to have a ferritin of 28 with an iron saturation of 12%.  It would be okay to offer her IV iron if she is agreeable.  I will check her CBC ferritin and iron studies in 4 months and 8 months and see her back in 8 months   Visit Diagnosis 1. Iron deficiency anemia, unspecified iron deficiency anemia type      Dr. Randa Evens, MD, MPH [facility] [phone] 07/01/2022 11:38 AM

## 2022-07-07 ENCOUNTER — Ambulatory Visit
Admission: RE | Admit: 2022-07-07 | Discharge: 2022-07-07 | Disposition: A | Discharge status: Home / Self Care | Payer: Medicare Other | Source: Ambulatory Visit | Attending: Internal Medicine | Admitting: Internal Medicine

## 2022-07-07 ENCOUNTER — Ambulatory Visit
Admission: RE | Admit: 2022-07-07 | Discharge: 2022-07-07 | Disposition: A | Discharge status: Home / Self Care | Payer: Medicare Other | Source: Ambulatory Visit | Attending: Interventional Radiology | Admitting: Interventional Radiology

## 2022-07-07 VITALS — BP 115/74 | HR 88 | Temp 97.8°F | Resp 14

## 2022-07-07 DIAGNOSIS — M25512 Pain in left shoulder: Secondary | ICD-10-CM

## 2022-07-07 DIAGNOSIS — M4856XA Collapsed vertebra, not elsewhere classified, lumbar region, initial encounter for fracture: Secondary | ICD-10-CM

## 2022-07-07 HISTORY — PX: IR RADIOLOGIST EVAL & MGMT: IMG5224

## 2022-07-07 NOTE — Consult Note (Signed)
Chief Complaint: Back Pain  Referring Physician(s): Miller,Mark F  History of Present Illness: [name] is a 82 y.o. female presenting as a scheduled consultation to [place] [facility] today, kindly referred by Dr. Sabra Heck, for evaluation of back pain, compression fracture, and candidacy for possible vertebral augmentation.   [name] is here today with her brother, [name], for the interview.   She tells me that this particular pain started at the beginning of the month of January.  She remembers that there was a very distinct moment that she recognized a change in the intensity and quality of pain in her back.  She was not doing anything in particular at the time.  She says she has lived with ongoing back pain for some time now, that she attributes to her RA.    She reports 8/10 pain on the pain scale.  She has been prescribed tylenol #3 with codeine, which she takes for some modest relief.  She reports that the pain does not keep her from falling asleep, but it does wake her in the middle of the night.    She scores 23/24 on the [employer] disability questionnaire.  She lives alone, and while her brother tells me that she might be able to use some assistance around the house, she refuses to let anyone help her.  She lives on single level home, and she tells me that she continues to take care of herself, but with more discomfort than usual.    She denies any resting chest pain.  She has never had an MI or stroke.  She does not have chest pain with activity.   The only other complaint she has right now is some "left shoulder pain" that started this morning.  She describes this as just below her shoulder blade, with activity.      MRI performed of the lumbar spine 06/16/22, with L2 compression fracture:     Past Medical History:  Diagnosis Date   Abdominal pain    Anemia    Anxiety    Arthritis, rheumatoid (HCC)    Breast pain    Chest discomfort    Chest pain    Chronic  pain syndrome    Collagen vascular disease (HCC)    hx rheumatoid arthritis.    Constipation    COPD (chronic obstructive pulmonary disease) (HCC)    Diverticulosis    Fibrocystic breast disease    Gastritis    GERD (gastroesophageal reflux disease)    Hemorrhoids    Hx of colonic polyps    Hypothyroidism    Osteopenia    PONV (postoperative nausea and vomiting)    Renal artery stenosis (HCC)    Right ankle pain    Thyroid binding globulin deficiency    Tobacco abuse     Past Surgical History:  Procedure Laterality Date   CHOLECYSTECTOMY     EMBOLIZATION N/A 01/22/2022   Procedure: EMBOLIZATION;  Surgeon: Katha Cabal, MD;  Location: [facility];  Service: Cardiovascular;  Laterality: N/A;   ESOPHAGOGASTRODUODENOSCOPY (EGD) WITH PROPOFOL N/A 01/22/2022   Procedure: ESOPHAGOGASTRODUODENOSCOPY (EGD) WITH PROPOFOL;  Surgeon: Lucilla Lame, MD;  Location: [facility];  Service: Endoscopy;  Laterality: N/A;   ESOPHAGOGASTRODUODENOSCOPY (EGD) WITH PROPOFOL N/A 04/03/2022   Procedure: ESOPHAGOGASTRODUODENOSCOPY (EGD) WITH PROPOFOL;  Surgeon: Lucilla Lame, MD;  Location: [facility];  Service: Endoscopy;  Laterality: N/A;   JOINT REPLACEMENT     right hip   LEG SURGERY     TONSILLECTOMY  Allergies: Other, Sulfa antibiotics, Etrafon [perphenazine-amitriptyline], Hydrocodone, Mirtazapine, Denosumab, Doxycycline, Oxycodone-acetaminophen, Percocet [oxycodone-acetaminophen], and Sertraline  Medications: Prior to Admission medications   Medication Sig Start Date End Date Taking? Authorizing Provider  acetaminophen-codeine (TYLENOL #3) 300-30 MG tablet Take 1 tablet by mouth every 6 (six) hours as needed. 06/16/22  Yes [provider]  ASPIRIN 81 PO 1 tablet DAILY (route: oral) 03/04/22  Yes [provider]  atorvastatin (LIPITOR) 10 MG tablet Take 10 mg by mouth daily. 07/22/21  Yes [provider]  buPROPion (WELLBUTRIN XL) 150 MG 24 hr  tablet Take 150 mg by mouth daily. 11/22/19  Yes [provider]  folic acid (FOLVITE) 1 MG tablet Take 1 tablet by mouth daily. 10/21/14  Yes [provider]  galantamine (RAZADYNE) 8 MG tablet Take 8 mg by mouth daily.   Yes [provider]  levothyroxine (SYNTHROID) 50 MCG tablet Take 50 mcg by mouth daily.    Yes [provider]  methotrexate 250 MG/10ML injection ADMINISTER 0.8 ML ONCE A WEEK 02/27/20  Yes [provider]  omeprazole (PRILOSEC) 40 MG capsule Take 40 mg by mouth 2 (two) times daily. 04/20/19  Yes [provider]  senna-docusate (SENOKOT-S) 8.6-50 MG tablet Take 2 tablets by mouth 2 (two) times daily as needed for mild constipation or moderate constipation. 01/27/22  Yes Emeterio Reeve, DO  tiZANidine (ZANAFLEX) 2 MG tablet Take 2 mg by mouth 2 (two) times daily. 12/03/19  Yes [provider]  dicyclomine (BENTYL) 10 MG capsule Take 10 mg by mouth 2 (two) times daily as needed. Patient not taking: Reported on 06/30/2022 09/30/21   [provider]  DULoxetine (CYMBALTA) 60 MG capsule Take 60 mg by mouth daily. Patient not taking: Reported on 06/30/2022    [provider]  etodolac (LODINE) 400 MG tablet Take 400 mg by mouth 2 (two) times daily. Patient not taking: Reported on 06/30/2022 03/09/22   [provider]  lidocaine (LIDODERM) 5 % APPLY ONE PATCH TOPICALLY ONE TIME DAILY; REMOVE AND DISCARD PATCH WITHIN 12 HOURS OR AS DIRECTED BY MD Patient not taking: Reported on 07/07/2022    [provider]     Family History  Problem Relation Age of Onset   Heart disease Sister    Hypertension Brother    Heart attack Brother    Hyperlipidemia Brother    Breast cancer Neg Hx     Social History   Socioeconomic History   Marital status: Single    Spouse name: Not on file   Number of children: Not on file   Years of education: Not on file   Highest education level: Not on file   Occupational History   Not on file  Tobacco Use   Smoking status: Some Days    Packs/day: 0.10    Types: Cigarettes   Smokeless tobacco: Never   Tobacco comments:    Pt maybe has 1 cig a  month  Vaping Use   Vaping Use: Never used  Substance and Sexual Activity   Alcohol use: No   Drug use: No   Sexual activity: Not Currently  Other Topics Concern   Not on file  Social History Narrative   Not on file   Social Determinants of Health   Financial Resource Strain: Not on file  Food Insecurity: No Food Insecurity (04/03/2022)   Hunger Vital Sign    Worried About Running Out of Food in the Last Year: Never true    Ran Out  of Food in the Last Year: Never true  Transportation Needs: No Transportation Needs (04/03/2022)   PRAPARE - Hydrologist (Medical): No    Lack of Transportation (Non-Medical): No  Physical Activity: Not on file  Stress: Not on file  Social Connections: Not on file       Review of Systems: A 12 point ROS discussed and pertinent positives are indicated in the HPI above.  All other systems are negative.  Review of Systems  Vital Signs: BP 115/74 (BP Location: Right Arm, Patient Position: Sitting, Cuff Size: Normal)   Pulse 88   Temp 97.8 F (36.6 C) (Oral)   Resp 14   SpO2 98%   Advance Care Plan: The advanced care plan/surrogate decision maker was discussed at the time of visit and documented in the medical record.    Physical Exam General: 82 yo female appearing stated age.  Well-developed, well-nourished.  Uncomfortable with waxing and waning pain HEENT: Atraumatic, normocephalic.  Conjugate gaze, extra-ocular motor intact. No scleral icterus or scleral injection. No lesions on external ears, nose, lips, or gums.  Oral mucosa moist, pink.  Neck: Symmetric with no goiter enlargement.  Chest/Lungs:  Symmetric chest with inspiration/expiration.  No labored breathing.  Clear to auscultation with no wheezes, rhonchi, or  rales.  Heart:  RRR, with no third heart sounds appreciated. No JVD appreciated.  Abdomen:  Soft, NT/ND, with + bowel sounds.   Genito-urinary: Deferred Neurologic: Alert & Oriented to person, place, and time.   Normal affect and insight.  Appropriate questions.  Moving all 4 extremities with gross sensory intact.  MSK: Active left shoulder flexion, abduction causes her some reproducible pain.  Passive flexion with abduction causes no discomfort.  I feel no crepitus or deformity.  Palpation of the midline back with no step off deformity or crepitus.  Reproducible pain at the upper lumbar level       Mallampati Score:     Imaging: MR LUMBAR SPINE WO CONTRAST  Result Date: 06/16/2022 CLINICAL DATA:  Upper lumbar compression fracture EXAM: MRI LUMBAR SPINE WITHOUT CONTRAST TECHNIQUE: Multiplanar, multisequence MR imaging of the lumbar spine was performed. No intravenous contrast was administered. COMPARISON:  09/14/2017 and CT abdomen from 04/02/2022 FINDINGS: Segmentation: The lowest lumbar type non-rib-bearing vertebra is labeled as L5. Alignment: 3 mm degenerative retrolisthesis at L2-3, similar to previous. Dextroconvex lumbar scoliosis with rotary component. Vertebrae: Interval 30% superior endplate compression fracture at L2 with 2-3 mm of posterior bony retropulsion indicating middle column involvement along the posterosuperior endplate. Mild associated marrow edema. Stable hemangioma in the L1 vertebral body. No posterior element edema observed. Intervertebral disc desiccation is observed at all levels between L2 and L5. Conus medullaris and cauda equina: Conus extends to the L1 level. Conus and cauda equina appear normal. Paraspinal and other soft tissues: A fluid signal intensity lesion of the left kidney is partially characterized on today's exam. This is statistically likely to be a cyst but not technically specific. No further imaging workup of this lesion is indicated. Disc levels:  T12-L1: Unremarkable L1-2: No impingement. Disc bulge and posterior bony retropulsion from the posterosuperior endplate at L2. L2-3: No impingement.  Mild disc bulge. L3-4: Mild bilateral subarticular lateral recess stenosis and borderline central narrowing of the thecal sac due to disc bulge and facet arthropathy. Small synovial cyst posterior to the left facet joint. Similar appearance to previous. L4-5: Mild bilateral subarticular lateral recess stenosis due to disc bulge and right lateral recess  disc protrusion, similar to previous. L5-S1: IMPRESSION: 1. Interval 30% superior endplate compression fracture at L2 with 2-3 mm of posterior bony retropulsion from the posterosuperior endplate. No impingement at this level. 2. Lumbar spondylosis and degenerative disc disease causing mild impingement at L3-4 and L4-5. 3. Dextroconvex lumbar scoliosis with rotary component. Electronically Signed   By: Van Clines M.D.   On: 06/16/2022 18:34    Labs:  CBC: Recent Labs    04/03/22 0112 04/03/22 0541 04/04/22 0500 06/30/22 1351  WBC 8.6 7.5 7.2 8.0  HGB 8.2* 10.1* 11.1* 11.3*  HCT 26.2* 31.6* 34.5* 35.8*  PLT 373 355 414* 329    COAGS: Recent Labs    01/21/22 1455 04/02/22 2128  INR 1.4* 1.2  APTT 29 29    BMP: Recent Labs    01/21/22 1455 04/02/22 1848 04/03/22 0541 04/04/22 0500  NA 135 134* 138 138  K 3.6 3.8 4.0 3.9  CL 106 102 109 107  CO2 '24 24 25 25  '$ GLUCOSE 93 110* 85 103*  BUN 39* '22 16 16  '$ CALCIUM 7.5* 8.0* 7.8* 8.3*  CREATININE 0.80 0.89 0.79 1.03*  GFRNONAA >60 >60 >60 55*    LIVER FUNCTION TESTS: Recent Labs    10/11/21 2255 01/21/22 1455 04/02/22 1848 04/03/22 0541  BILITOT 0.7 0.5 0.6 1.1  AST '25 19 21 19  '$ ALT '17 13 12 10  '$ ALKPHOS 59 41 79 67  PROT 5.9* 4.8* 7.1 5.9*  ALBUMIN 3.2* 2.5* 3.4* 2.9*    TUMOR MARKERS: No results for input(s): "AFPTM", "CEA", "CA199", "CHROMGRNA" in the last 8760 hours.  Assessment and Plan:  [name] is very  pleasant 82 yo female with life-style limiting 8/10 intensity pain of the lumbar region secondary to acute L2 compression fracture.    She has known osteoporosis and is receiving treatment.    We discussed the anatomy, pathology/pathophysiology, risk factors, natural history, and treatment option for lumbar compression fractures. Historical treatments including observation, and the contemporary minimally invasive treatment of vertebral augmentation/kyphoplasty.    Specifically with vertebral augmentation/kyphoplasty, we discussed the logistics of image guided treatment, the goals of therapy, and the risk/benefits.  Regarding goals of therapy, our goals are primarily to reduce pain, and secondarily to reduce the need for strong pain medications, and secondarily for increased function. I did let them know that while we have a very good rate of reduction of pain, we cannot guarantee complete pain relief, as there is often some baseline pain axis from degeneration that we are not treating. Regarding risks, bleeding, infection, local injury, embolism, nerve injury, need for additional surgery/procedure, need for hospitalization, cardiopulmonary collapse, death.    After our discussion, they would like to proceed, at first available, not necessarily with me.    Plan: - Given the pain in her left shoulder today, we performed a formal left shoulder xray, which is negative for acute.  - Plan to proceed with vertebral augmentation/kyphoplasty of L2 acute fracture, first available. - continue current care, including treatment for osteopenia    Thank you for this interesting consult.  I greatly enjoyed meeting [name] and look forward to participating in their care.  A copy of this report was sent to the requesting provider on this date.  Electronically Signed: Corrie Mckusick 07/07/2022, 2:36 PM   I spent a total of  60 Minutes   in face to face in clinical consultation, greater than 50% of  which was counseling/coordinating care for L2 compression fracture, possible  kyphoplasty

## 2022-07-09 ENCOUNTER — Ambulatory Visit: Payer: Medicare Other

## 2022-07-12 ENCOUNTER — Inpatient Hospital Stay: Payer: Medicare Other

## 2022-07-12 NOTE — Discharge Instructions (Signed)
Kyphoplasty Post Procedure Discharge Instructions  May resume a regular diet and any medications that you routinely take (including pain medications). However, if you are taking Aspirin or an anticoagulant/blood thinner you will be told when you can resume taking these by the healthcare provider. No driving day of procedure. The day of your procedure take it easy. You may use an ice pack as needed to injection sites on back.  Ice to back 30 minutes on and 30 minutes off, as needed. May remove bandaids tomorrow after taking a shower. Replace daily with a clean bandaid until healed.  Do not lift anything heavier than a milk jug for 1-2 weeks or determined by your physician.  Follow up with your physician in 2 weeks.    Please contact our office at [phone] for the following symptoms or if you have any questions:  Fever greater than 100 degrees Increased swelling, pain, or redness at injection site. Increased back and/or leg pain New numbness or change in symptoms from before the procedure.    Thank you for visiting [facility].  May resume aspirin immediately after procedure. 

## 2022-07-13 ENCOUNTER — Emergency Department: Payer: Medicare Other

## 2022-07-13 ENCOUNTER — Emergency Department
Admission: EM | Admit: 2022-07-13 | Discharge: 2022-07-13 | Disposition: A | Discharge status: Home / Self Care | Payer: Medicare Other | Attending: Emergency Medicine | Admitting: Emergency Medicine

## 2022-07-13 ENCOUNTER — Other Ambulatory Visit: Payer: Self-pay

## 2022-07-13 ENCOUNTER — Inpatient Hospital Stay: Admission: RE | Admit: 2022-07-13 | Payer: Medicare Other | Source: Ambulatory Visit

## 2022-07-13 ENCOUNTER — Encounter: Payer: Self-pay | Admitting: Emergency Medicine

## 2022-07-13 DIAGNOSIS — S32020D Wedge compression fracture of second lumbar vertebra, subsequent encounter for fracture with routine healing: Secondary | ICD-10-CM | POA: Insufficient documentation

## 2022-07-13 DIAGNOSIS — R109 Unspecified abdominal pain: Secondary | ICD-10-CM | POA: Diagnosis present

## 2022-07-13 DIAGNOSIS — R1084 Generalized abdominal pain: Secondary | ICD-10-CM

## 2022-07-13 DIAGNOSIS — K529 Noninfective gastroenteritis and colitis, unspecified: Secondary | ICD-10-CM | POA: Diagnosis not present

## 2022-07-13 DIAGNOSIS — J449 Chronic obstructive pulmonary disease, unspecified: Secondary | ICD-10-CM | POA: Diagnosis not present

## 2022-07-13 DIAGNOSIS — X58XXXA Exposure to other specified factors, initial encounter: Secondary | ICD-10-CM | POA: Insufficient documentation

## 2022-07-13 LAB — HEPATIC FUNCTION PANEL
ALT: 13 U/L (ref 0–44)
AST: 19 U/L (ref 15–41)
Albumin: 3.3 g/dL — ABNORMAL LOW (ref 3.5–5.0)
Alkaline Phosphatase: 80 U/L (ref 38–126)
Bilirubin, Direct: <0.1 mg/dL (ref 0.0–0.2)
Total Bilirubin: 0.6 mg/dL (ref 0.3–1.2)
Total Protein: 6.5 g/dL (ref 6.5–8.1)

## 2022-07-13 LAB — BASIC METABOLIC PANEL
Anion gap: 11 (ref 5–15)
BUN: 17 mg/dL (ref 8–23)
CO2: 28 mmol/L (ref 22–32)
Calcium: 8.6 mg/dL — ABNORMAL LOW (ref 8.9–10.3)
Chloride: 99 mmol/L (ref 98–111)
Creatinine, Ser: 1 mg/dL (ref 0.44–1.00)
GFR, Estimated: 57 mL/min — ABNORMAL LOW (ref >60)
Glucose, Bld: 98 mg/dL (ref 70–99)
Potassium: 3.8 mmol/L (ref 3.5–5.1)
Sodium: 138 mmol/L (ref 135–145)

## 2022-07-13 LAB — URINALYSIS, ROUTINE W REFLEX MICROSCOPIC
Bilirubin Urine: NEGATIVE
Glucose, UA: NEGATIVE mg/dL
Hgb urine dipstick: NEGATIVE
Ketones, ur: NEGATIVE mg/dL
Leukocytes,Ua: NEGATIVE
Nitrite: NEGATIVE
Protein, ur: NEGATIVE mg/dL
Specific Gravity, Urine: 1.005 (ref 1.005–1.030)
pH: 8 (ref 5.0–8.0)

## 2022-07-13 LAB — CBC
HCT: 32.7 % — ABNORMAL LOW (ref 36.0–46.0)
Hemoglobin: 10.5 g/dL — ABNORMAL LOW (ref 12.0–15.0)
MCH: 30.1 pg (ref 26.0–34.0)
MCHC: 32.1 g/dL (ref 30.0–36.0)
MCV: 93.7 fL (ref 80.0–100.0)
Platelets: 433 10*3/uL — ABNORMAL HIGH (ref 150–400)
RBC: 3.49 MIL/uL — ABNORMAL LOW (ref 3.87–5.11)
RDW: 15.5 % (ref 11.5–15.5)
WBC: 6.3 10*3/uL (ref 4.0–10.5)
nRBC: 0 % (ref 0.0–0.2)

## 2022-07-13 LAB — TROPONIN I (HIGH SENSITIVITY): Troponin I (High Sensitivity): 16 ng/L (ref <18)

## 2022-07-13 LAB — LIPASE, BLOOD: Lipase: 35 U/L (ref 11–51)

## 2022-07-13 MED ORDER — LIDOCAINE 5 % EX PTCH: 1.0000 | MEDICATED_PATCH | Freq: Two times a day (BID) | CUTANEOUS | 0 refills | Status: DC

## 2022-07-13 MED ORDER — MORPHINE SULFATE (PF) 4 MG/ML IV SOLN
4.0000 mg | Freq: Once | INTRAVENOUS | Status: AC
  Administered 2022-07-13: 4 mg via INTRAVENOUS
  Filled 2022-07-13: qty 1

## 2022-07-13 MED ORDER — ONDANSETRON HCL 4 MG/2ML IJ SOLN
4.0000 mg | Freq: Once | INTRAMUSCULAR | Status: AC
  Administered 2022-07-13: 4 mg via INTRAVENOUS
  Filled 2022-07-13: qty 2

## 2022-07-13 MED ORDER — IOHEXOL 350 MG/ML SOLN
75.0000 mL | Freq: Once | INTRAVENOUS | Status: AC | PRN
  Administered 2022-07-13: 75 mL via INTRAVENOUS

## 2022-07-13 MED ORDER — LACTATED RINGERS IV BOLUS: 500.0000 mL | Freq: Once | INTRAVENOUS | Status: DC

## 2022-07-13 MED ORDER — IOHEXOL 300 MG/ML  SOLN: 75.0000 mL | Freq: Once | INTRAMUSCULAR | Status: DC | PRN

## 2022-07-13 MED ORDER — LIDOCAINE 5 % EX PTCH
1.0000 | MEDICATED_PATCH | CUTANEOUS | Status: DC
  Administered 2022-07-13: 1 via TRANSDERMAL
  Filled 2022-07-13: qty 1

## 2022-07-13 MED ORDER — SODIUM CHLORIDE 0.9 % IV BOLUS
500.0000 mL | Freq: Once | INTRAVENOUS | Status: AC
  Administered 2022-07-13: 500 mL via INTRAVENOUS

## 2022-07-13 NOTE — ED Notes (Signed)
TLSO  Brace  ordered

## 2022-07-13 NOTE — ED Triage Notes (Signed)
Pt here with abd and chest pain that started last night. Pt states she has been nauseous but has not vomited. Pt states pain starts in her abd and radiates to up to her chest. Pt has a compound fx in her lower back and was scheduled for a procedure this morning. Pt states pain is 10/10.

## 2022-07-13 NOTE — Progress Notes (Signed)
Orthopedic Tech Progress Note Patient Details:  Sharmaine June Forcier Aug 01, 1940 [id number]  Called in order to [facility] for a STAT TLSO BRACE   Patient ID: [name], female   DOB: 1940-09-03, 82 y.o.   MRN: [id number]  Janit Pagan 07/13/2022, 10:33 AM

## 2022-07-13 NOTE — ED Provider Notes (Signed)
[facility] Provider Note    Event Date/Time   First MD Initiated Contact with Patient 07/13/22 [phone]     (approximate)   History   Chief Complaint Abdominal Pain and Chest Pain   HPI  [name] is a 82 y.o. female with past medical history of rheumatoid arthritis, chronic pain syndrome, GERD, and COPD who presents to the ED complaining of abdominal pain.  Patient reports that yesterday afternoon she developed crampy pain in the bilateral lower quadrants of her abdomen that has seem to radiate upwards into her upper abdomen and lower chest.  She describes the pain as intermittent, not exacerbated or alleviated by anything in particular.  She has been feeling nauseous but has not vomited, brother at bedside does report that she has been constipated recently.  She denies any fevers, cough, or difficulty breathing.  She denies any dysuria, hematuria, or flank pain.  She was recently diagnosed with L2 compression fracture and was scheduled for procedure related to this this morning, does complain of ongoing back pain but has not had any new falls.  She does not have any numbness or weakness in her lower extremities, denies saddle anesthesia or difficulty urinating.     Physical Exam   Triage Vital Signs: ED Triage Vitals  Enc Vitals Group     BP 07/13/22 0710 126/75     Pulse Rate 07/13/22 0709 (!) 53     Resp 07/13/22 0709 16     Temp 07/13/22 0709 (!) 97.5 F (36.4 C)     Temp Source 07/13/22 0709 Oral     SpO2 07/13/22 0709 97 %     Weight 07/13/22 0709 89 lb 1.1 oz (40.4 kg)     Height 07/13/22 0709 '5\' 4"'$  (1.626 m)     Head Circumference --      Peak Flow --      Pain Score 07/13/22 0709 10     Pain Loc --      Pain Edu? --      Excl. in [place]? --     Most recent vital signs: Vitals:   07/13/22 0710 07/13/22 1130  BP: 126/75 122/70  Pulse:  (!) 55  Resp:  16  Temp:  98 F (36.7 C)  SpO2:  98%    Constitutional: Alert and  oriented. Eyes: Conjunctivae are normal. Head: Atraumatic. Nose: No congestion/rhinnorhea. Mouth/Throat: Mucous membranes are moist.  Cardiovascular: Normal rate, regular rhythm. Grossly normal heart sounds.  2+ radial pulses bilaterally. Respiratory: Normal respiratory effort.  No retractions. Lungs CTAB. Gastrointestinal: Soft and tender to palpation diffusely with no rebound or guarding. No distention. Musculoskeletal: No lower extremity tenderness nor edema.  Tenderness to palpation over midline lumbar spine and bilateral paraspinal areas.  No midline thoracic spinal tenderness to palpation. Neurologic:  Normal speech and language. No gross focal neurologic deficits are appreciated.    ED Results / Procedures / Treatments   Labs (all labs ordered are listed, but only abnormal results are displayed) Labs Reviewed  BASIC METABOLIC PANEL - Abnormal; Notable for the following components:      Result Value   Calcium 8.6 (*)    GFR, Estimated 57 (*)    All other components within normal limits  CBC - Abnormal; Notable for the following components:   RBC 3.49 (*)    Hemoglobin 10.5 (*)    HCT 32.7 (*)    Platelets 433 (*)    All other components within normal limits  HEPATIC FUNCTION PANEL - Abnormal; Notable for the following components:   Albumin 3.3 (*)    All other components within normal limits  URINALYSIS, ROUTINE W REFLEX MICROSCOPIC - Abnormal; Notable for the following components:   Color, Urine STRAW (*)    APPearance CLEAR (*)    All other components within normal limits  LIPASE, BLOOD  TROPONIN I (HIGH SENSITIVITY)     EKG  ED ECG REPORT I, Blake Divine, the attending physician, personally viewed and interpreted this ECG.   Date: 07/13/2022  EKG Time: 7:14  Rate: 88  Rhythm: normal sinus rhythm  Axis: Normal  Intervals:none  ST&T Change: None  RADIOLOGY Chest x-ray reviewed and interpreted by me with no infiltrate, edema, or  effusion.  PROCEDURES:  Critical Care performed: No  Procedures   MEDICATIONS ORDERED IN ED: Medications  lactated ringers bolus 500 mL (500 mLs Intravenous Not Given 07/13/22 0814)  lidocaine (LIDODERM) 5 % 1 patch (1 patch Transdermal Patch Applied 07/13/22 1056)  morphine (PF) 4 MG/ML injection 4 mg (4 mg Intravenous Given 07/13/22 0856)  ondansetron (ZOFRAN) injection 4 mg (4 mg Intravenous Given 07/13/22 0852)  iohexol (OMNIPAQUE) 350 MG/ML injection 75 mL (75 mLs Intravenous Contrast Given 07/13/22 0905)  sodium chloride 0.9 % bolus 500 mL (0 mLs Intravenous Stopped 07/13/22 1112)     IMPRESSION / MDM / ASSESSMENT AND PLAN / ED COURSE  I reviewed the triage vital signs and the nursing notes.                              82 y.o. female with past medical history of rheumatoid arthritis, chronic pain syndrome, GERD, and COPD who presents to the ED with about 24 hours of crampy bilateral lower quadrant abdominal pain moving upwards towards her chest with nausea and constipation.  Patient's presentation is most consistent with acute presentation with potential threat to life or bodily function.  Differential diagnosis includes, but is not limited to, bowel obstruction, diverticulitis, appendicitis, UTI, kidney stone, colitis, pancreatitis, hepatitis, biliary colic, cholecystitis, gastritis, GERD, ACS, PE, pneumonia, pneumothorax, compression fracture.  Patient uncomfortable appearing but in no acute distress, vital signs are unremarkable.  She has tenderness to palpation of her abdomen diffusely and we will further assess with CT imaging.  Labs thus far are reassuring with no significant anemia, leukocytosis, electrolyte abnormality, or AKI.  EKG shows no evidence of arrhythmia or ischemia and troponin within normal limits, low suspicion for ACS or PE given atypical symptoms.  Chest x-ray is negative for acute process, does show possible thoracic compression fracture but patient without any  recent trauma and has no midline thoracic tenderness.  LFTs and lipase are pending along with urinalysis.  Will hydrate with IV fluids and treat symptomatically with IV morphine and Zofran.  CT of abdomen/pelvis remarkable for colitis but otherwise reassuring.  Suspect infectious etiology for her colitis, no evidence of bacterial infection.  Urinalysis shows no signs of infection, LFTs and lipase are unremarkable.  On reassessment, patient's back pain is much improved.  She was placed in a TLSO brace for her compression fracture, has been ambulatory here in the ED without difficulty.  She is appropriate for discharge home with PCP follow-up to reschedule procedure related to known compression fracture.  Patient and brother counseled to have her return to the ED for new or worsening symptoms, they agree with plan.      FINAL CLINICAL IMPRESSION(S) / ED  DIAGNOSES   Final diagnoses:  Generalized abdominal pain  Colitis  Compression fracture of L2 vertebra with routine healing, subsequent encounter     Rx / DC Orders   ED Discharge Orders          Ordered    lidocaine (LIDODERM) 5 %  Every 12 hours        07/13/22 1243             Note:  This document was prepared using Dragon voice recognition software and may include unintentional dictation errors.   Blake Divine, MD 07/13/22 [phone]

## 2022-07-14 ENCOUNTER — Inpatient Hospital Stay: Admission: RE | Admit: 2022-07-14 | Payer: Medicare Other | Source: Ambulatory Visit

## 2022-07-15 ENCOUNTER — Other Ambulatory Visit: Payer: Medicare Other

## 2022-07-15 ENCOUNTER — Inpatient Hospital Stay: Admission: RE | Admit: 2022-07-15 | Payer: Medicare Other | Source: Ambulatory Visit

## 2022-07-16 ENCOUNTER — Other Ambulatory Visit: Payer: Self-pay | Admitting: Oncology

## 2022-07-19 ENCOUNTER — Inpatient Hospital Stay: Payer: Medicare Other | Attending: Oncology

## 2022-07-19 VITALS — BP 128/86 | HR 78 | Temp 97.5°F | Resp 18

## 2022-07-19 DIAGNOSIS — D509 Iron deficiency anemia, unspecified: Secondary | ICD-10-CM | POA: Diagnosis present

## 2022-07-19 MED ORDER — SODIUM CHLORIDE 0.9 % IV SOLN
Freq: Once | INTRAVENOUS | Status: AC
  Filled 2022-07-19: qty 250

## 2022-07-19 MED ORDER — SODIUM CHLORIDE 0.9 % IV SOLN
510.0000 mg | INTRAVENOUS | Status: DC
  Administered 2022-07-19: 510 mg via INTRAVENOUS
  Filled 2022-07-19: qty 510

## 2022-07-26 ENCOUNTER — Inpatient Hospital Stay: Payer: Medicare Other

## 2022-07-26 VITALS — BP 106/52 | HR 72 | Temp 96.8°F | Resp 18

## 2022-07-26 DIAGNOSIS — D509 Iron deficiency anemia, unspecified: Secondary | ICD-10-CM | POA: Diagnosis not present

## 2022-07-26 MED ORDER — SODIUM CHLORIDE 0.9 % IV SOLN
510.0000 mg | INTRAVENOUS | Status: DC
  Administered 2022-07-26: 510 mg via INTRAVENOUS
  Filled 2022-07-26: qty 17

## 2022-07-26 MED ORDER — SODIUM CHLORIDE 0.9 % IV SOLN
Freq: Once | INTRAVENOUS | Status: AC
  Filled 2022-07-26: qty 250

## 2022-07-26 NOTE — Patient Instructions (Signed)

## 2022-08-19 DIAGNOSIS — K838 Other specified diseases of biliary tract: Secondary | ICD-10-CM | POA: Insufficient documentation

## 2022-10-29 ENCOUNTER — Inpatient Hospital Stay: Payer: Medicare Other | Attending: Oncology

## 2022-11-02 ENCOUNTER — Ambulatory Visit
Admission: RE | Admit: 2022-11-02 | Discharge: 2022-11-02 | Disposition: A | Discharge status: Home / Self Care | Payer: Medicare Other | Source: Ambulatory Visit | Attending: Physician Assistant | Admitting: Physician Assistant

## 2022-11-02 ENCOUNTER — Other Ambulatory Visit: Payer: Self-pay | Admitting: Physician Assistant

## 2022-11-02 DIAGNOSIS — M545 Low back pain, unspecified: Secondary | ICD-10-CM | POA: Insufficient documentation

## 2022-11-02 DIAGNOSIS — W19XXXD Unspecified fall, subsequent encounter: Secondary | ICD-10-CM | POA: Insufficient documentation

## 2022-11-02 DIAGNOSIS — R102 Pelvic and perineal pain: Secondary | ICD-10-CM | POA: Diagnosis present

## 2022-11-02 DIAGNOSIS — G8911 Acute pain due to trauma: Secondary | ICD-10-CM | POA: Diagnosis not present

## 2022-11-02 DIAGNOSIS — Y92009 Unspecified place in unspecified non-institutional (private) residence as the place of occurrence of the external cause: Secondary | ICD-10-CM | POA: Diagnosis not present

## 2022-11-02 DIAGNOSIS — I700 Atherosclerosis of aorta: Secondary | ICD-10-CM | POA: Insufficient documentation

## 2022-11-02 DIAGNOSIS — S32592A Other specified fracture of left pubis, initial encounter for closed fracture: Secondary | ICD-10-CM | POA: Insufficient documentation

## 2022-11-02 DIAGNOSIS — K5730 Diverticulosis of large intestine without perforation or abscess without bleeding: Secondary | ICD-10-CM | POA: Insufficient documentation

## 2023-02-22 ENCOUNTER — Other Ambulatory Visit: Payer: Self-pay | Admitting: Internal Medicine

## 2023-02-22 DIAGNOSIS — Z1231 Encounter for screening mammogram for malignant neoplasm of breast: Secondary | ICD-10-CM

## 2023-02-28 ENCOUNTER — Other Ambulatory Visit: Payer: Self-pay | Admitting: *Deleted

## 2023-02-28 DIAGNOSIS — D509 Iron deficiency anemia, unspecified: Secondary | ICD-10-CM

## 2023-03-01 ENCOUNTER — Inpatient Hospital Stay (HOSPITAL_BASED_OUTPATIENT_CLINIC_OR_DEPARTMENT_OTHER): Payer: Medicare Other | Admitting: Oncology

## 2023-03-01 ENCOUNTER — Encounter: Payer: Self-pay | Admitting: Oncology

## 2023-03-01 ENCOUNTER — Inpatient Hospital Stay: Payer: Medicare Other | Attending: Oncology

## 2023-03-01 VITALS — BP 134/84 | HR 50 | Temp 97.6°F | Resp 18 | Ht 64.0 in | Wt 91.3 lb

## 2023-03-01 DIAGNOSIS — D509 Iron deficiency anemia, unspecified: Secondary | ICD-10-CM | POA: Insufficient documentation

## 2023-03-01 DIAGNOSIS — Z79899 Other long term (current) drug therapy: Secondary | ICD-10-CM | POA: Diagnosis not present

## 2023-03-01 LAB — CBC
HCT: 36 % (ref 36.0–46.0)
Hemoglobin: 12 g/dL (ref 12.0–15.0)
MCH: 32.3 pg (ref 26.0–34.0)
MCHC: 33.3 g/dL (ref 30.0–36.0)
MCV: 96.8 fL (ref 80.0–100.0)
Platelets: 323 10*3/uL (ref 150–400)
RBC: 3.72 MIL/uL — ABNORMAL LOW (ref 3.87–5.11)
RDW: 11.9 % (ref 11.5–15.5)
WBC: 11.2 10*3/uL — ABNORMAL HIGH (ref 4.0–10.5)
nRBC: 0 % (ref 0.0–0.2)

## 2023-03-01 LAB — FERRITIN: Ferritin: 104 ng/mL (ref 11–307)

## 2023-03-01 LAB — IRON AND TIBC
Iron: 97 ug/dL (ref 28–170)
Saturation Ratios: 32 % — ABNORMAL HIGH (ref 10.4–31.8)
TIBC: 308 ug/dL (ref 250–450)
UIBC: 211 ug/dL

## 2023-03-02 ENCOUNTER — Encounter: Payer: Self-pay | Admitting: Oncology

## 2023-03-02 NOTE — Progress Notes (Signed)
Hematology/Oncology Consult note [facility]  [phone] [phone]  Patient Care Team: Danella Penton, MD as PCP - General (Unknown Physician Specialty) Creig Hines, MD as Consulting Physician (Oncology)   Name of the patient: [name]  [id number]  [birth year]   Date of visit: 03/02/23  Diagnosis- anemia of chronic disease as well as component of iron deficiency   Chief complaint/ Reason for visit-routine follow-up of anemia  Heme/Onc history: patient is a 82 year old female with a past medical history significant for hypertension hypothyroidism hyperlipidemia rheumatoid arthritis among other medical problems.  She has been referred for iron deficiency anemia.  Her recent CBC from 10/12/2021 showed white count of 5.2, H&H of 8/26.3 with an MCV of 86 and a platelet count of 198.  Folate levels were normal.  Iron studies showed a low iron saturation of 6%.  Reticulocyte count was low at 1.4%.  B12 levels normal at 368.  Looking back at her prior CBCs patient's baseline hemoglobin runs around 12 up until July 2022 and then started gradually drifting down to the tens in October 2022.  No recent ferritin levels checked.  TSH normal at 4.7.   Labs were suggestive of iron deficiency and patient received 2 doses of Feraheme in May 2023  Interval history-patient has been having back pain for which she underwent x-rays at [facility] clinic.  We do not have those results to review at this time.  She has baseline fatigue but denies other complaints  ECOG PS- 2 Pain scale- 3   Review of systems- Review of Systems  Constitutional:  Positive for malaise/fatigue. Negative for chills, fever and weight loss.  HENT:  Negative for congestion, ear discharge and nosebleeds.   Eyes:  Negative for blurred vision.  Respiratory:  Negative for cough, hemoptysis, sputum production, shortness of breath and wheezing.   Cardiovascular:  Negative for chest pain,  palpitations, orthopnea and claudication.  Gastrointestinal:  Negative for abdominal pain, blood in stool, constipation, diarrhea, heartburn, melena, nausea and vomiting.  Genitourinary:  Negative for dysuria, flank pain, frequency, hematuria and urgency.  Musculoskeletal:  Negative for back pain, joint pain and myalgias.  Skin:  Negative for rash.  Neurological:  Negative for dizziness, tingling, focal weakness, seizures, weakness and headaches.  Endo/Heme/Allergies:  Does not bruise/bleed easily.  Psychiatric/Behavioral:  Negative for depression and suicidal ideas. The patient does not have insomnia.       Allergies  Allergen Reactions   Other Other (See Comments)    Uncoded Allergy. Allergen: symmetral Uncoded Allergy. Allergen: etrafon Uncoded Allergy. Allergen: desbutal Uncoded Allergy. Allergen: symmetral Uncoded Allergy. Allergen: etrafon Uncoded Allergy. Allergen: desbutal Other reaction(s): Unknown Uncoded Allergy. Allergen: symmetral Other reaction(s): Unknown Uncoded Allergy. Allergen: etrafon Uncoded Allergy. Allergen: symmetral Uncoded Allergy. Allergen: etrafon Uncoded Allergy. Allergen: desbutal    Sulfa Antibiotics     Other reaction(s): Unknown Uncoded Allergy. Allergen: desbutal   Etrafon [Perphenazine-Amitriptyline]    Hydrocodone Nausea And Vomiting   Mirtazapine     Other reaction(s): Hallucination   Denosumab Itching   Doxycycline Nausea Only   Oxycodone-Acetaminophen Nausea And Vomiting and Other (See Comments)    Other reaction(s): Other (See Comments)   Percocet [Oxycodone-Acetaminophen] Nausea And Vomiting   Sertraline Palpitations     Past Medical History:  Diagnosis Date   Abdominal pain    Anemia    Anxiety    Arthritis, rheumatoid (HCC)    Breast pain    Chest discomfort    Chest  pain    Chronic pain syndrome    Collagen vascular disease (HCC)    hx rheumatoid arthritis.    Constipation    COPD (chronic obstructive pulmonary  disease) (HCC)    Diverticulosis    Fibrocystic breast disease    Gastritis    GERD (gastroesophageal reflux disease)    Hemorrhoids    Hx of colonic polyps    Hypothyroidism    Osteopenia    PONV (postoperative nausea and vomiting)    Renal artery stenosis (HCC)    Right ankle pain    Thyroid binding globulin deficiency    Tobacco abuse      Past Surgical History:  Procedure Laterality Date   CHOLECYSTECTOMY     EMBOLIZATION (CATH LAB) N/A 01/22/2022   Procedure: EMBOLIZATION;  Surgeon: Renford Dills, MD;  Location: [facility] INVASIVE CV LAB;  Service: Cardiovascular;  Laterality: N/A;   ESOPHAGOGASTRODUODENOSCOPY (EGD) WITH PROPOFOL N/A 01/22/2022   Procedure: ESOPHAGOGASTRODUODENOSCOPY (EGD) WITH PROPOFOL;  Surgeon: Midge Minium, MD;  Location: [facility] ENDOSCOPY;  Service: Endoscopy;  Laterality: N/A;   ESOPHAGOGASTRODUODENOSCOPY (EGD) WITH PROPOFOL N/A 04/03/2022   Procedure: ESOPHAGOGASTRODUODENOSCOPY (EGD) WITH PROPOFOL;  Surgeon: Midge Minium, MD;  Location: [facility] ENDOSCOPY;  Service: Endoscopy;  Laterality: N/A;   IR RADIOLOGIST EVAL & MGMT  07/07/2022   JOINT REPLACEMENT     right hip   LEG SURGERY     TONSILLECTOMY      Social History   Socioeconomic History   Marital status: Single    Spouse name: Not on file   Number of children: Not on file   Years of education: Not on file   Highest education level: Not on file  Occupational History   Not on file  Tobacco Use   Smoking status: Some Days    Current packs/day: 0.10    Types: Cigarettes   Smokeless tobacco: Never   Tobacco comments:    Pt maybe has 1 cig a  month  Vaping Use   Vaping status: Never Used  Substance and Sexual Activity   Alcohol use: No   Drug use: No   Sexual activity: Not Currently  Other Topics Concern   Not on file  Social History Narrative   Not on file   Social Determinants of Health   Financial Resource Strain: Low Risk  (02/24/2023)   Received from [facility]    Overall Financial Resource Strain (CARDIA)    Difficulty of Paying Living Expenses: Not hard at all  Food Insecurity: No Food Insecurity (02/24/2023)   Received from [facility]   Hunger Vital Sign    Worried About Running Out of Food in the Last Year: Never true    Ran Out of Food in the Last Year: Never true  Transportation Needs: No Transportation Needs (02/24/2023)   Received from [facility] - Transportation    In the past 12 months, has lack of transportation kept you from medical appointments or from getting medications?: No    Lack of Transportation (Non-Medical): No  Physical Activity: Not on file  Stress: Not on file  Social Connections: Not on file  Intimate Partner Violence: Not At Risk (04/03/2022)   Humiliation, Afraid, Rape, and Kick questionnaire    Fear of Current or Ex-Partner: No    Emotionally Abused: No    Physically Abused: No    Sexually Abused: No    Family History  Problem Relation Age of Onset   Heart  disease Sister    Hypertension Brother    Heart attack Brother    Hyperlipidemia Brother    Breast cancer Neg Hx      Current Outpatient Medications:    acetaminophen-codeine (TYLENOL #3) 300-30 MG tablet, Take 1 tablet by mouth every 6 (six) hours as needed., Disp: , Rfl:    ASPIRIN 81 PO, 1 tablet DAILY (route: oral), Disp: , Rfl:    atorvastatin (LIPITOR) 10 MG tablet, Take 10 mg by mouth daily., Disp: , Rfl:    buPROPion (WELLBUTRIN XL) 150 MG 24 hr tablet, Take 150 mg by mouth daily., Disp: , Rfl:    dicyclomine (BENTYL) 10 MG capsule, Take 1 capsule by mouth 2 (two) times daily., Disp: , Rfl:    DULoxetine (CYMBALTA) 60 MG capsule, Take 60 mg by mouth daily., Disp: , Rfl:    folic acid (FOLVITE) 1 MG tablet, Take 1 tablet by mouth daily., Disp: , Rfl:    galantamine (RAZADYNE) 8 MG tablet, Take 8 mg by mouth daily., Disp: , Rfl:    levothyroxine (SYNTHROID) 50 MCG tablet, Take 50 mcg by mouth daily.  , Disp: , Rfl:    lidocaine (LIDODERM) 5 %, Place 1 patch onto the skin every 12 (twelve) hours. Remove & Discard patch within 12 hours or as directed by MD, Disp: 10 patch, Rfl: 0   methotrexate 250 MG/10ML injection, ADMINISTER 0.8 ML ONCE A WEEK, Disp: , Rfl:    omeprazole (PRILOSEC) 40 MG capsule, Take 40 mg by mouth 2 (two) times daily., Disp: , Rfl:    senna-docusate (SENOKOT-S) 8.6-50 MG tablet, Take 2 tablets by mouth 2 (two) times daily as needed for mild constipation or moderate constipation., Disp: 30 tablet, Rfl: 0   tiZANidine (ZANAFLEX) 2 MG tablet, Take 2 mg by mouth 2 (two) times daily., Disp: , Rfl:    dicyclomine (BENTYL) 10 MG capsule, Take 10 mg by mouth 2 (two) times daily as needed. (Patient not taking: Reported on 06/30/2022), Disp: , Rfl:    etodolac (LODINE) 400 MG tablet, Take 400 mg by mouth 2 (two) times daily. (Patient not taking: Reported on 06/30/2022), Disp: , Rfl:   Physical exam:  Vitals:   03/01/23 1449  BP: 134/84  Pulse: (!) 50  Resp: 18  Temp: 97.6 F (36.4 C)  TempSrc: Tympanic  SpO2: 99%  Weight: 91 lb 4.8 oz (41.4 kg)  Height: 5\' 4"  (1.626 m)   Physical Exam Constitutional:      Comments: Ambulates with a walker.  Appears in no acute distress  Cardiovascular:     Rate and Rhythm: Normal rate and regular rhythm.     Heart sounds: Normal heart sounds.  Pulmonary:     Effort: Pulmonary effort is normal.     Breath sounds: Normal breath sounds.  Abdominal:     General: Bowel sounds are normal.     Palpations: Abdomen is soft.  Skin:    General: Skin is warm and dry.  Neurological:     Mental Status: She is alert and oriented to person, place, and time.         Latest Ref Rng & Units 07/13/2022    7:19 AM  CMP  Glucose 70 - 99 mg/dL 98   BUN 8 - 23 mg/dL 17   Creatinine 8.29 - 1.00 mg/dL 5.62   Sodium 130 - 865 mmol/L 138   Potassium 3.5 - 5.1 mmol/L 3.8   Chloride 98 - 111 mmol/L 99   CO2 22 -  32 mmol/L 28   Calcium 8.9 - 10.3 mg/dL  8.6   Total Protein 6.5 - 8.1 g/dL 6.5   Total Bilirubin 0.3 - 1.2 mg/dL 0.6   Alkaline Phos 38 - 126 U/L 80   AST 15 - 41 U/L 19   ALT 0 - 44 U/L 13       Latest Ref Rng & Units 03/01/2023    2:03 PM  CBC  WBC 4.0 - 10.5 K/uL 11.2   Hemoglobin 12.0 - 15.0 g/dL 72.5   Hematocrit 36.6 - 46.0 % 36.0   Platelets 150 - 400 K/uL 323      Assessment and plan- Patient is a 82 y.o. female here for routine follow-up of anemia of chronic disease  Patient's hemoglobin is improved to 12 after receiving IV iron in February 2024.  Ferritin levels are normal at 104 with an iron saturation of 32%.  She does not require any IV iron at this time.  I will repeat CBC ferritin and iron studies in 4 and 8 months and see her back in 8 months   Visit Diagnosis 1. Iron deficiency anemia, unspecified iron deficiency anemia type      Dr. Owens Shark, MD, MPH [facility] [phone] 03/02/2023 8:38 AM

## 2023-04-05 ENCOUNTER — Ambulatory Visit
Admission: RE | Admit: 2023-04-05 | Discharge: 2023-04-05 | Disposition: A | Discharge status: Home / Self Care | Payer: Medicare Other | Source: Ambulatory Visit | Attending: Internal Medicine | Admitting: Internal Medicine

## 2023-04-05 DIAGNOSIS — Z1231 Encounter for screening mammogram for malignant neoplasm of breast: Secondary | ICD-10-CM | POA: Insufficient documentation

## 2023-04-12 ENCOUNTER — Other Ambulatory Visit: Payer: Self-pay

## 2023-04-12 ENCOUNTER — Emergency Department: Payer: Medicare Other

## 2023-04-12 ENCOUNTER — Inpatient Hospital Stay
Admission: EM | Admit: 2023-04-12 | Discharge: 2023-04-16 | DRG: 377 | Disposition: A | Discharge status: Home / Self Care | Payer: Medicare Other | Attending: Internal Medicine | Admitting: Internal Medicine

## 2023-04-12 DIAGNOSIS — Z83438 Family history of other disorder of lipoprotein metabolism and other lipidemia: Secondary | ICD-10-CM

## 2023-04-12 DIAGNOSIS — K219 Gastro-esophageal reflux disease without esophagitis: Secondary | ICD-10-CM | POA: Diagnosis present

## 2023-04-12 DIAGNOSIS — E43 Unspecified severe protein-calorie malnutrition: Secondary | ICD-10-CM | POA: Diagnosis present

## 2023-04-12 DIAGNOSIS — K625 Hemorrhage of anus and rectum: Secondary | ICD-10-CM

## 2023-04-12 DIAGNOSIS — Z7982 Long term (current) use of aspirin: Secondary | ICD-10-CM | POA: Diagnosis not present

## 2023-04-12 DIAGNOSIS — Z79890 Hormone replacement therapy (postmenopausal): Secondary | ICD-10-CM | POA: Diagnosis not present

## 2023-04-12 DIAGNOSIS — M549 Dorsalgia, unspecified: Secondary | ICD-10-CM | POA: Diagnosis present

## 2023-04-12 DIAGNOSIS — F17210 Nicotine dependence, cigarettes, uncomplicated: Secondary | ICD-10-CM | POA: Diagnosis present

## 2023-04-12 DIAGNOSIS — R634 Abnormal weight loss: Secondary | ICD-10-CM | POA: Diagnosis present

## 2023-04-12 DIAGNOSIS — Z885 Allergy status to narcotic agent status: Secondary | ICD-10-CM | POA: Diagnosis not present

## 2023-04-12 DIAGNOSIS — D62 Acute posthemorrhagic anemia: Secondary | ICD-10-CM

## 2023-04-12 DIAGNOSIS — Z681 Body mass index (BMI) 19 or less, adult: Secondary | ICD-10-CM

## 2023-04-12 DIAGNOSIS — Z8249 Family history of ischemic heart disease and other diseases of the circulatory system: Secondary | ICD-10-CM | POA: Diagnosis not present

## 2023-04-12 DIAGNOSIS — F0390 Unspecified dementia without behavioral disturbance: Secondary | ICD-10-CM | POA: Diagnosis present

## 2023-04-12 DIAGNOSIS — Z8719 Personal history of other diseases of the digestive system: Secondary | ICD-10-CM | POA: Diagnosis not present

## 2023-04-12 DIAGNOSIS — Z882 Allergy status to sulfonamides status: Secondary | ICD-10-CM | POA: Diagnosis not present

## 2023-04-12 DIAGNOSIS — F419 Anxiety disorder, unspecified: Secondary | ICD-10-CM | POA: Diagnosis present

## 2023-04-12 DIAGNOSIS — J449 Chronic obstructive pulmonary disease, unspecified: Secondary | ICD-10-CM | POA: Diagnosis present

## 2023-04-12 DIAGNOSIS — Z79899 Other long term (current) drug therapy: Secondary | ICD-10-CM

## 2023-04-12 DIAGNOSIS — E039 Hypothyroidism, unspecified: Secondary | ICD-10-CM | POA: Diagnosis present

## 2023-04-12 DIAGNOSIS — K5731 Diverticulosis of large intestine without perforation or abscess with bleeding: Secondary | ICD-10-CM | POA: Diagnosis present

## 2023-04-12 DIAGNOSIS — D510 Vitamin B12 deficiency anemia due to intrinsic factor deficiency: Secondary | ICD-10-CM | POA: Diagnosis present

## 2023-04-12 DIAGNOSIS — R109 Unspecified abdominal pain: Secondary | ICD-10-CM

## 2023-04-12 DIAGNOSIS — K922 Gastrointestinal hemorrhage, unspecified: Secondary | ICD-10-CM | POA: Diagnosis not present

## 2023-04-12 DIAGNOSIS — G894 Chronic pain syndrome: Secondary | ICD-10-CM | POA: Diagnosis present

## 2023-04-12 DIAGNOSIS — G3184 Mild cognitive impairment, so stated: Secondary | ICD-10-CM | POA: Diagnosis not present

## 2023-04-12 DIAGNOSIS — Z888 Allergy status to other drugs, medicaments and biological substances status: Secondary | ICD-10-CM | POA: Diagnosis not present

## 2023-04-12 DIAGNOSIS — M059 Rheumatoid arthritis with rheumatoid factor, unspecified: Secondary | ICD-10-CM | POA: Diagnosis not present

## 2023-04-12 DIAGNOSIS — Z860100 Personal history of colon polyps, unspecified: Secondary | ICD-10-CM | POA: Diagnosis not present

## 2023-04-12 DIAGNOSIS — K5730 Diverticulosis of large intestine without perforation or abscess without bleeding: Secondary | ICD-10-CM | POA: Diagnosis not present

## 2023-04-12 DIAGNOSIS — K921 Melena: Secondary | ICD-10-CM | POA: Diagnosis not present

## 2023-04-12 DIAGNOSIS — M069 Rheumatoid arthritis, unspecified: Secondary | ICD-10-CM | POA: Diagnosis present

## 2023-04-12 LAB — CBC
HCT: 27 % — ABNORMAL LOW (ref 36.0–46.0)
Hemoglobin: 9.1 g/dL — ABNORMAL LOW (ref 12.0–15.0)
MCH: 31.4 pg (ref 26.0–34.0)
MCHC: 33.7 g/dL (ref 30.0–36.0)
MCV: 93.1 fL (ref 80.0–100.0)
Platelets: 218 10*3/uL (ref 150–400)
RBC: 2.9 MIL/uL — ABNORMAL LOW (ref 3.87–5.11)
RDW: 15.9 % — ABNORMAL HIGH (ref 11.5–15.5)
WBC: 6.6 10*3/uL (ref 4.0–10.5)
nRBC: 0 % (ref 0.0–0.2)

## 2023-04-12 LAB — CBC WITH DIFFERENTIAL/PLATELET
Abs Immature Granulocytes: 0.03 10*3/uL (ref 0.00–0.07)
Basophils Absolute: 0 10*3/uL (ref 0.0–0.1)
Basophils Relative: 0 %
Eosinophils Absolute: 0.2 10*3/uL (ref 0.0–0.5)
Eosinophils Relative: 3 %
HCT: 25.7 % — ABNORMAL LOW (ref 36.0–46.0)
Hemoglobin: 8.5 g/dL — ABNORMAL LOW (ref 12.0–15.0)
Immature Granulocytes: 0 %
Lymphocytes Relative: 33 %
Lymphs Abs: 2.9 10*3/uL (ref 0.7–4.0)
MCH: 32.1 pg (ref 26.0–34.0)
MCHC: 33.1 g/dL (ref 30.0–36.0)
MCV: 97 fL (ref 80.0–100.0)
Monocytes Absolute: 1 10*3/uL (ref 0.1–1.0)
Monocytes Relative: 11 %
Neutro Abs: 4.7 10*3/uL (ref 1.7–7.7)
Neutrophils Relative %: 53 %
Platelets: 342 10*3/uL (ref 150–400)
RBC: 2.65 MIL/uL — ABNORMAL LOW (ref 3.87–5.11)
RDW: 14.2 % (ref 11.5–15.5)
WBC: 8.9 10*3/uL (ref 4.0–10.5)
nRBC: 0 % (ref 0.0–0.2)

## 2023-04-12 LAB — COMPREHENSIVE METABOLIC PANEL
ALT: 17 U/L (ref 0–44)
AST: 22 U/L (ref 15–41)
Albumin: 3.2 g/dL — ABNORMAL LOW (ref 3.5–5.0)
Alkaline Phosphatase: 59 U/L (ref 38–126)
Anion gap: 7 (ref 5–15)
BUN: 22 mg/dL (ref 8–23)
CO2: 25 mmol/L (ref 22–32)
Calcium: 8.1 mg/dL — ABNORMAL LOW (ref 8.9–10.3)
Chloride: 105 mmol/L (ref 98–111)
Creatinine, Ser: 0.87 mg/dL (ref 0.44–1.00)
GFR, Estimated: >60 mL/min (ref >60)
Glucose, Bld: 185 mg/dL — ABNORMAL HIGH (ref 70–99)
Potassium: 3 mmol/L — ABNORMAL LOW (ref 3.5–5.1)
Sodium: 137 mmol/L (ref 135–145)
Total Bilirubin: 0.4 mg/dL (ref <1.2)
Total Protein: 5.7 g/dL — ABNORMAL LOW (ref 6.5–8.1)

## 2023-04-12 LAB — PREPARE RBC (CROSSMATCH)

## 2023-04-12 LAB — MAGNESIUM: Magnesium: 2.1 mg/dL (ref 1.7–2.4)

## 2023-04-12 LAB — PROTIME-INR
INR: 1.2 (ref 0.8–1.2)
Prothrombin Time: 15.6 s — ABNORMAL HIGH (ref 11.4–15.2)

## 2023-04-12 MED ORDER — PANTOPRAZOLE SODIUM 40 MG IV SOLR
80.0000 mg | Freq: Once | INTRAVENOUS | Status: AC
Start: 2023-04-12 — End: 2023-04-12
  Administered 2023-04-12: 80 mg via INTRAVENOUS
  Filled 2023-04-12: qty 20

## 2023-04-12 MED ORDER — ACETAMINOPHEN 325 MG RE SUPP: 650.0000 mg | Freq: Four times a day (QID) | RECTAL | Status: DC | PRN

## 2023-04-12 MED ORDER — ONDANSETRON HCL 4 MG PO TABS: 4.0000 mg | ORAL_TABLET | Freq: Four times a day (QID) | ORAL | Status: DC | PRN

## 2023-04-12 MED ORDER — TIZANIDINE HCL 2 MG PO TABS
2.0000 mg | ORAL_TABLET | Freq: Two times a day (BID) | ORAL | Status: DC
  Administered 2023-04-12 – 2023-04-16 (×6): 2 mg via ORAL
  Filled 2023-04-12 (×8): qty 1

## 2023-04-12 MED ORDER — POTASSIUM CHLORIDE 10 MEQ/100ML IV SOLN
10.0000 meq | INTRAVENOUS | Status: AC
  Administered 2023-04-12 (×2): 10 meq via INTRAVENOUS
  Filled 2023-04-12 (×2): qty 100

## 2023-04-12 MED ORDER — LEVOTHYROXINE SODIUM 50 MCG PO TABS
50.0000 ug | ORAL_TABLET | Freq: Every day | ORAL | Status: DC
Start: 2023-04-13 — End: 2023-04-16
  Administered 2023-04-13 – 2023-04-16 (×4): 50 ug via ORAL
  Filled 2023-04-12 (×4): qty 1

## 2023-04-12 MED ORDER — GALANTAMINE HYDROBROMIDE 4 MG PO TABS
8.0000 mg | ORAL_TABLET | Freq: Every day | ORAL | Status: DC
Start: 2023-04-13 — End: 2023-04-12

## 2023-04-12 MED ORDER — BOOST / RESOURCE BREEZE PO LIQD CUSTOM
1.0000 | Freq: Three times a day (TID) | ORAL | Status: DC
Start: 2023-04-13 — End: 2023-04-16
  Administered 2023-04-14 – 2023-04-16 (×6): 1 via ORAL

## 2023-04-12 MED ORDER — SODIUM CHLORIDE 0.9% FLUSH
3.0000 mL | Freq: Two times a day (BID) | INTRAVENOUS | Status: DC
  Administered 2023-04-12 – 2023-04-16 (×9): 3 mL via INTRAVENOUS

## 2023-04-12 MED ORDER — ACETAMINOPHEN 325 MG PO TABS
650.0000 mg | ORAL_TABLET | Freq: Four times a day (QID) | ORAL | Status: DC | PRN
  Administered 2023-04-13 – 2023-04-15 (×2): 650 mg via ORAL
  Filled 2023-04-12 (×2): qty 2

## 2023-04-12 MED ORDER — RIVASTIGMINE TARTRATE 3 MG PO CAPS
3.0000 mg | ORAL_CAPSULE | Freq: Two times a day (BID) | ORAL | Status: DC
  Administered 2023-04-12 – 2023-04-16 (×6): 3 mg via ORAL
  Filled 2023-04-12 (×9): qty 1

## 2023-04-12 MED ORDER — FOLIC ACID 1 MG PO TABS
1.0000 mg | ORAL_TABLET | Freq: Every day | ORAL | Status: DC
  Administered 2023-04-13 – 2023-04-16 (×3): 1 mg via ORAL
  Filled 2023-04-12 (×3): qty 1

## 2023-04-12 MED ORDER — ATORVASTATIN CALCIUM 10 MG PO TABS
10.0000 mg | ORAL_TABLET | Freq: Every day | ORAL | Status: DC
  Administered 2023-04-13 – 2023-04-16 (×4): 10 mg via ORAL
  Filled 2023-04-12 (×4): qty 1

## 2023-04-12 MED ORDER — ONDANSETRON HCL 4 MG/2ML IJ SOLN
4.0000 mg | Freq: Four times a day (QID) | INTRAMUSCULAR | Status: DC | PRN
  Administered 2023-04-13 (×2): 4 mg via INTRAVENOUS
  Filled 2023-04-12 (×2): qty 2

## 2023-04-12 MED ORDER — IOHEXOL 350 MG/ML SOLN
80.0000 mL | Freq: Once | INTRAVENOUS | Status: AC | PRN
  Administered 2023-04-12: 80 mL via INTRAVENOUS

## 2023-04-12 MED ORDER — DULOXETINE HCL 20 MG PO CPEP
20.0000 mg | ORAL_CAPSULE | Freq: Two times a day (BID) | ORAL | Status: DC
  Administered 2023-04-12 – 2023-04-16 (×7): 20 mg via ORAL
  Filled 2023-04-12 (×8): qty 1

## 2023-04-12 NOTE — Assessment & Plan Note (Signed)
Chronic issue that patient has been following with GI at [facility].  - Dietitian consulted

## 2023-04-12 NOTE — Assessment & Plan Note (Signed)
Receives outpatient B12 injections.  Last B12 above 1500 approximately 3 months ago.

## 2023-04-12 NOTE — Assessment & Plan Note (Signed)
Previous hemoglobin of 12.0 approximately 5 weeks ago, currently 8.5.  Given tachycardia, 1 unit of packed RBC ordered in the ED.  - Transfuse 1 unit of packed RBC - Posttransfusion CBC - Continue to transfuse for hemoglobin less than 7 or hemodynamic instability

## 2023-04-12 NOTE — ED Provider Notes (Addendum)
[facility] Provider Note    Event Date/Time   First MD Initiated Contact with Patient 04/12/23 [phone]     (approximate)   History   Rectal Bleeding   HPI  [name] is a 82 y.o. female   Past medical history of prior GI bleed requiring blood transfusion 2023, rheumatoid arthritis, GERD, hypothyroid, COPD, hemorrhoids, diverticulosis, here with rectal bleeding overnight.  First occurrence around midnight, bright red blood and blood clots and then several other bowel movements with only blood throughout the night.  Feels cold.  Feels generalized weakness.  Feels left-sided abdominal pain.  No GU symptoms.  No thinners, asa only.   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      Physical Exam   Triage Vital Signs: ED Triage Vitals [04/12/23 0904]  Encounter Vitals Group     BP 121/71     Systolic BP Percentile      Diastolic BP Percentile      Pulse Rate 82     Resp 18     Temp      Temp src      SpO2 96 %     Weight 90 lb 6.2 oz (41 kg)     Height 5\' 4"  (1.626 m)     Head Circumference      Peak Flow      Pain Score      Pain Loc      Pain Education      Exclude from Growth Chart     Most recent vital signs: Vitals:   04/12/23 1130 04/12/23 1131  BP: 112/71 112/71  Pulse: (!) 116 (!) 117  Resp: (!) 28 18  Temp:  98.1 F (36.7 C)  SpO2: 98% 99%    General: Awake, no distress.  CV:  Good peripheral perfusion.  Resp:  Normal effort.  Abd:  No distention.  Other:  Pale appearing, left-sided abdominal tenderness without rigidity or guarding, rectal exam shows bright red blood.   ED Results / Procedures / Treatments   Labs (all labs ordered are listed, but only abnormal results are displayed) Labs Reviewed  COMPREHENSIVE METABOLIC  PANEL - Abnormal; Notable for the following components:      Result Value   Potassium 3.0 (*)    Glucose, Bld 185 (*)    Calcium 8.1 (*)    Total Protein 5.7 (*)    Albumin 3.2 (*)    All other components within normal limits  CBC WITH DIFFERENTIAL/PLATELET - Abnormal; Notable for the following components:   RBC 2.65 (*)    Hemoglobin 8.5 (*)    HCT 25.7 (*)    All other components within normal limits  PROTIME-INR - Abnormal; Notable for the following components:   Prothrombin Time 15.6 (*)    All other components within normal limits  MAGNESIUM  TYPE AND SCREEN  PREPARE RBC (CROSSMATCH)     I ordered and reviewed the above labs they are notable for hemoglobin of 8.5 which is approximately 3 point drop from September labs  ED ECG REPORT I, Pilar Jarvis, the attending physician, personally viewed and interpreted this ECG.   Date: 04/12/2023  EKG Time: 1004  Rate: 102  Rhythm: sinus tachycardia  Axis: nl  Intervals:none  ST&T Change: no stemi  PROCEDURES:  Critical Care  performed: Yes, see critical care procedure note(s)  .Critical Care  Performed by: Pilar Jarvis, MD Authorized by: Pilar Jarvis, MD   Critical care provider statement:    Critical care time (minutes):  30   Critical care was time spent personally by me on the following activities:  Development of treatment plan with patient or surrogate, discussions with consultants, evaluation of patient's response to treatment, examination of patient, ordering and review of laboratory studies, ordering and review of radiographic studies, ordering and performing treatments and interventions, pulse oximetry, re-evaluation of patient's condition and review of old charts    MEDICATIONS ORDERED IN ED: Medications  potassium chloride 10 mEq in 100 mL IVPB (10 mEq Intravenous New Bag/Given 04/12/23 1206)  pantoprazole (PROTONIX) injection 80 mg (80 mg Intravenous Given 04/12/23 0924)  iohexol (OMNIPAQUE) 350 MG/ML injection 80  mL (80 mLs Intravenous Contrast Given 04/12/23 1019)     IMPRESSION / MDM / ASSESSMENT AND PLAN / ED COURSE  I reviewed the triage vital signs and the nursing notes.                                Patient's presentation is most consistent with acute presentation with potential threat to life or bodily function.  Differential diagnosis includes, but is not limited to, lower GI bleeding, upper GI bleeding, acute blood loss anemia, intra-abdominal infection, mesenteric ischemia   The patient is on the cardiac monitor to evaluate for evidence of arrhythmia and/or significant heart rate changes.  MDM:    Rectal bleeding brisk, several episodes overnight and this pale appearing patient with history of GI bleed requiring blood transfusions.  Check type and screen labs, transfusion consent obtained by patient for transfusion if indicated.  Tenderness to palpation of left side of the abdomen, check CT of the abdomen pelvis GI bleed protocol.  Pending workup as above, suspect admission given acuity of bleed and prior history of bleeding requiring transfusion.   -- Hemoglobin has dropped nearly 3 points.  Approximately 1 month ago, in the setting of active lower GI bleeding and now tachycardia will transfuse 1 unit of blood.  CT imaging pending; admission plan.        FINAL CLINICAL IMPRESSION(S) / ED DIAGNOSES   Final diagnoses:  Acute lower GI bleeding  Left sided abdominal pain  Acute blood loss anemia     Rx / DC Orders   ED Discharge Orders     None        Note:  This document was prepared using Dragon voice recognition software and may include unintentional dictation errors.    Pilar Jarvis, MD 04/12/23 1610    Pilar Jarvis, MD 04/12/23 [phone]

## 2023-04-12 NOTE — Assessment & Plan Note (Addendum)
Patient previously on methotrexate, Simponi and Cimzia injections per chart review. Holding at this time.

## 2023-04-12 NOTE — ED Triage Notes (Signed)
Pt lives alone from home, pt repots having bright red bloody bowel movements starting at midnight, pt reports some weakness, and dry heaving.

## 2023-04-12 NOTE — Consult Note (Signed)
PHARMACY CONSULT NOTE - ELECTROLYTES  Pharmacy Consult for Electrolyte Monitoring and Replacement   Recent Labs: Height: 5\' 4"  (162.6 cm) Weight: 41 kg (90 lb 6.2 oz) IBW/kg (Calculated) : 54.7 Estimated Creatinine Clearance: 32.8 mL/min (by C-G formula based on SCr of 0.87 mg/dL). Potassium (mmol/L)  Date Value  04/12/2023 3.0 (L)   Magnesium (mg/dL)  Date Value  62/13/0865 2.1   Calcium (mg/dL)  Date Value  78/46/9629 8.1 (L)   Albumin (g/dL)  Date Value  52/84/1324 3.2 (L)   Sodium (mmol/L)  Date Value  04/12/2023 137   Corrected Ca: 8.74 mg/dL  Assessment  [name] is a 82 y.o. female presenting with rectal bleeding. PMH significant for previous GI bleed ISO gastritis and duodenal ectasia s/p embolization, iron deficiency anemia, COPD, hypothyroidism and rheumatoid arthritis. Pharmacy has been consulted to monitor and replace electrolytes.  Diet: NPO  Goal of Therapy: Electrolytes WNL  Plan:  K 3.0: Kcl x 2 ordered by physician Check BMP, Mg, Phos with AM labs  Thank you for allowing pharmacy to be a part of this patient's care.  Bettey Costa, PharmD Clinical Pharmacist 04/12/2023 2:27 PM

## 2023-04-12 NOTE — ED Notes (Signed)
Assisted patient to restroom, patient wanted writer to see that the toilet was full of blood from her rectum.

## 2023-04-12 NOTE — Assessment & Plan Note (Addendum)
-   Continue home galantamine and rivastigmine

## 2023-04-12 NOTE — Consult Note (Signed)
Midge Minium, MD [facility]  [address], [address] [place], [place] [address] Phone: [phone] Fax : [phone]  Consultation  Referring Provider:     Dr. Chipper Herb Primary Care Physician:  Danella Penton, MD Primary Gastroenterologist:  Dr. Norma Fredrickson         Reason for Consultation:     GI bleed  Date of Admission:  04/12/2023 Date of Consultation:  04/12/2023         HPI:   [name] is a 82 y.o. female who has a history of recurrent GI bleeds.  The patient had a upper endoscopy by me back in August 2023 with bleeding seen in the duodenum with clips placed and cautery done on the patient's AVMs.  2 months later the patient had another upper endoscopy by me that showed some gastritis but no active bleeding.  The patient had another EGD in March of this year at [facility] for weight loss and abdominal pain.  There was reported atrophy of the entire stomach and patchy inflammation in the gastric antrum.  There was a few 2 to 4 mm sessile polyps with no stigmata of any recent bleeding in the gastric body. The patient now comes in with a report of having bright red blood per rectum that started at 1:00 in the morning last night.  The patient had a significant drop in her hemoglobin when assessed in the ER.  The patient's labs have shown:  Component     Latest Ref Rng 06/30/2022 07/13/2022 03/01/2023 04/12/2023  Hemoglobin     12.0 - 15.0 g/dL 18.8 (L)  41.6 (L)  60.6  8.5 (L)   HCT     36.0 - 46.0 % 35.8 (L)  32.7 (L)  36.0  25.7 (L)    She also reports that she has abdominal pain and she reports the abdominal pain to be in the upper abdomen although she points to her lower abdomen.  There is no report of any nausea or vomiting.  She also reports that her rectal bleeding was bright red blood per rectum without any melena or hematemesis.  Past Medical History:  Diagnosis Date   Abdominal pain    Anemia    Anxiety    Arthritis, rheumatoid (HCC)    Breast pain    Chest discomfort    Chest  pain    Chronic pain syndrome    Collagen vascular disease (HCC)    hx rheumatoid arthritis.    Constipation    COPD (chronic obstructive pulmonary disease) (HCC)    Diverticulosis    Fibrocystic breast disease    Gastritis    GERD (gastroesophageal reflux disease)    Hemorrhoids    Hx of colonic polyps    Hypothyroidism    Osteopenia    PONV (postoperative nausea and vomiting)    Renal artery stenosis (HCC)    Right ankle pain    Thyroid binding globulin deficiency    Tobacco abuse     Past Surgical History:  Procedure Laterality Date   CHOLECYSTECTOMY     EMBOLIZATION (CATH LAB) N/A 01/22/2022   Procedure: EMBOLIZATION;  Surgeon: Renford Dills, MD;  Location: [facility] INVASIVE CV LAB;  Service: Cardiovascular;  Laterality: N/A;   ESOPHAGOGASTRODUODENOSCOPY (EGD) WITH PROPOFOL N/A 01/22/2022   Procedure: ESOPHAGOGASTRODUODENOSCOPY (EGD) WITH PROPOFOL;  Surgeon: Midge Minium, MD;  Location: [facility] ENDOSCOPY;  Service: Endoscopy;  Laterality: N/A;   ESOPHAGOGASTRODUODENOSCOPY (EGD) WITH PROPOFOL N/A 04/03/2022   Procedure: ESOPHAGOGASTRODUODENOSCOPY (EGD)  WITH PROPOFOL;  Surgeon: Midge Minium, MD;  Location[facility] ENDOSCOPY;  Service: Endoscopy;  Laterality: N/A;   IR RADIOLOGIST EVAL & MGMT  07/07/2022   JOINT REPLACEMENT     right hip   LEG SURGERY     TONSILLECTOMY      Prior to Admission medications   Medication Sig Start Date End Date Taking? Authorizing Provider  clobetasol (TEMOVATE) 0.05 % external solution APPLY TO SCALP DAILY AS NEEDED FOR ITCHING. 03/21/23  Yes [provider]  gentamicin ointment (GARAMYCIN) 0.1 % Apply 1 Application topically 2 (two) times daily. 12/31/22  Yes [provider]  golimumab (SIMPONI ARIA) 50 MG/4ML SOLN injection 2mg /kg Intravenous every 8 weeks 11/08/22  Yes [provider]  hyoscyamine (LEVSIN SL) 0.125 MG SL tablet Place under the tongue every 4 (four) hours as needed. 03/30/23  Yes [provider]   magic mouthwash (nystatin, hydrocortisone, diphenhydrAMINE, lidocaine) suspension SWISH AND SPIT OUT 1 TABLESPOONFUL ( ) FOUR TIMES DAILY 04/07/23  Yes [provider]  methotrexate 50 MG/2ML injection Inject 0.8 mLs into the muscle once a week. 0.8 Milliliter(s) Injection Once a Week 02/21/23  Yes [provider]  mupirocin ointment (BACTROBAN) 2 % Apply 1 Application topically daily. 03/31/23  Yes [provider]  nystatin (MYCOSTATIN) 100000 UNIT/ML suspension PLEASE SEE ATTACHED FOR DETAILED DIRECTIONS 03/21/23  Yes [provider]  nystatin (MYCOSTATIN) 500000 units TABS tablet Take by mouth. 04/07/23  Yes [provider]  rivastigmine (EXELON) 3 MG capsule Take by mouth. 03/07/23 03/06/24 Yes [provider]  acetaminophen-codeine (TYLENOL #3) 300-30 MG tablet Take 1 tablet by mouth every 6 (six) hours as needed. 06/16/22   [provider]  ASPIRIN 81 PO 1 tablet DAILY (route: oral) 03/04/22   [provider]  atorvastatin (LIPITOR) 10 MG tablet Take 10 mg by mouth daily. 07/22/21   [provider]  buPROPion (WELLBUTRIN XL) 150 MG 24 hr tablet Take 150 mg by mouth daily. 11/22/19   [provider]  certolizumab pegol (CIMZIA) 2 X 200 MG KIT Inject into the skin.    [provider]  dicyclomine (BENTYL) 10 MG capsule Take 10 mg by mouth 2 (two) times daily as needed. Patient not taking: Reported on 06/30/2022 09/30/21   [provider]  dicyclomine (BENTYL) 10 MG capsule Take 1 capsule by mouth 2 (two) times daily. 07/20/22 07/20/23  [provider]  DULoxetine (CYMBALTA) 20 MG capsule Take 20 mg by mouth 2 (two) times daily.    [provider]  DULoxetine (CYMBALTA) 60 MG capsule Take 60 mg by mouth daily.    [provider]  etodolac (LODINE) 400 MG tablet Take 400 mg by mouth 2 (two) times daily. Patient not taking: Reported on 06/30/2022 03/09/22   [provider]  folic acid (FOLVITE) 1 MG tablet Take 1 tablet by mouth daily. 10/21/14   [provider]  galantamine (RAZADYNE) 8 MG tablet Take 8 mg by mouth daily.    [provider]  levothyroxine (SYNTHROID) 50 MCG tablet Take 50 mcg by mouth daily.     [provider]  lidocaine (LIDODERM) 5 % Place 1 patch onto the skin every 12 (twelve) hours. Remove & Discard patch within 12 hours or as directed by MD 07/13/22 07/13/23  Chesley Noon, MD  megestrol (MEGACE) 20 MG tablet Take 20 mg by mouth daily.    [provider]  methotrexate 250 MG/10ML injection ADMINISTER 0.8 ML ONCE A WEEK 02/27/20  [provider]  omeprazole (PRILOSEC) 40 MG capsule Take 40 mg by mouth 2 (two) times daily. 04/20/19   [provider]  senna-docusate (SENOKOT-S) 8.6-50 MG tablet Take 2 tablets by mouth 2 (two) times daily as needed for mild constipation or moderate constipation. 01/27/22   Sunnie Nielsen, DO  tiZANidine (ZANAFLEX) 2 MG tablet Take 2 mg by mouth 2 (two) times daily. 12/03/19   [provider]    Family History  Problem Relation Age of Onset   Heart disease Sister    Hypertension Brother    Heart attack Brother    Hyperlipidemia Brother    Breast cancer Neg Hx      Social History   Tobacco Use   Smoking status: Some Days    Current packs/day: 0.10    Types: Cigarettes   Smokeless tobacco: Never   Tobacco comments:    Pt maybe has 1 cig a  month  Vaping Use   Vaping status: Never Used  Substance Use Topics   Alcohol use: No   Drug use: No    Allergies as of 04/12/2023 - Review Complete 04/12/2023  Allergen Reaction Noted   Other Other (See Comments) 07/12/2014   Sulfa antibiotics  11/21/2017   Etrafon [perphenazine-amitriptyline]  02/13/2015   Hydrocodone Nausea And Vomiting 11/05/2011   Mirtazapine  02/18/2015   Denosumab Itching 12/04/2020   Doxycycline Nausea Only 02/18/2015   Oxycodone-acetaminophen Nausea  And Vomiting and Other (See Comments) 12/30/2014   Percocet [oxycodone-acetaminophen] Nausea And Vomiting 12/30/2014   Sertraline Palpitations 08/18/2016    Review of Systems:    All systems reviewed and negative except where noted in HPI.   Physical Exam:  Vital signs in last 24 hours: Temp:  [97.8 F (36.6 C)-98.4 F (36.9 C)] 98.4 F (36.9 C) (11/05 1354) Pulse Rate:  [66-117] 90 (11/05 1354) Resp:  [16-28] 16 (11/05 1354) BP: (97-146)/(58-86) 137/78 (11/05 1354) SpO2:  [96 %-99 %] 99 % (11/05 1354) Weight:  [41 kg] 41 kg (11/05 0904)   General:   Pleasant, cooperative in NAD Head:  Normocephalic and atraumatic. Eyes:   No icterus.   Conjunctiva pink. PERRLA. Ears:  Normal auditory acuity. Neck:  Supple; no masses or thyroidomegaly Lungs: Respirations even and unlabored. Lungs clear to auscultation bilaterally.   No wheezes, crackles, or rhonchi.  Heart:  Regular rate and rhythm;  Without murmur, clicks, rubs or gallops Abdomen:  Soft, nondistended, mildly tender in the lower abdomen. Normal bowel sounds. No appreciable masses or hepatomegaly.  No rebound or guarding.  Rectal:  Not performed. Msk:  Symmetrical without gross deformities.    Extremities:  Without edema, cyanosis or clubbing. Neurologic:  Alert and oriented x3;  grossly normal neurologically. Skin:  Intact without significant lesions or rashes. Cervical Nodes:  No significant cervical adenopathy. Psych:  Alert and cooperative. Normal affect.  LAB RESULTS: Recent Labs    04/12/23 0910  WBC 8.9  HGB 8.5*  HCT 25.7*  PLT 342   BMET Recent Labs    04/12/23 0910  NA 137  K 3.0*  CL 105  CO2 25  GLUCOSE 185*  BUN 22  CREATININE 0.87  CALCIUM 8.1*   LFT Recent Labs    04/12/23 0910  PROT 5.7*  ALBUMIN 3.2*  AST 22  ALT 17  ALKPHOS 59  BILITOT 0.4   PT/INR Recent Labs    04/12/23 0910  LABPROT 15.6*  INR 1.2    STUDIES: CT ANGIO GI BLEED  Result Date:  04/12/2023 CLINICAL DATA:   Brisk bright red blood per rectum overnight EXAM: CTA ABDOMEN AND PELVIS WITHOUT AND WITH CONTRAST TECHNIQUE: Multidetector CT imaging of the abdomen and pelvis was performed using the standard protocol during bolus administration of intravenous contrast. Multiplanar reconstructed images and MIPs were obtained and reviewed to evaluate the vascular anatomy. RADIATION DOSE REDUCTION: This exam was performed according to the departmental dose-optimization program which includes automated exposure control, adjustment of the mA and/or kV according to patient size and/or use of iterative reconstruction technique. CONTRAST:  80mL OMNIPAQUE IOHEXOL 350 MG/ML SOLN COMPARISON:  CT pelvis dated 10/25/2022, CT abdomen and pelvis dated 07/13/2022 FINDINGS: VASCULAR Aorta: Aortic atherosclerosis with segmental irregularity, most notably in the infrarenal aorto bi-iliac region. No dissection, vasculitis, or significant stenosis. Focal saccular outpouching arising from the right lateral aspect of the suprarenal aorta measures 8 x 4 mm (8:40). Celiac: Patent without evidence of aneurysm, dissection, vasculitis or significant stenosis. SMA: Right hepatic artery arises from the SMA. Patent without evidence of aneurysm, dissection, vasculitis or significant stenosis. Renals: 2 renal arteries bilaterally. Mild narrowing of the left renal artery origins due to atherosclerotic plaque. No evidence of aneurysm, dissection, or vasculitis. IMA: Patent without evidence of aneurysm, dissection, vasculitis or significant stenosis. Inflow: Segmental mild luminal narrowing of the bilateral common iliac arteries due to atherosclerotic plaque. No evidence of aneurysm, dissection, or vasculitis. Proximal Outflow: Bilateral common femoral and visualized portions of the superficial and profunda femoral arteries are patent without evidence of aneurysm, dissection, vasculitis or significant stenosis. Veins: No obvious venous abnormality. Retroaortic left  renal vein. Review of the MIP images confirms the above findings. NON-VASCULAR Lower chest: No focal consolidation or pulmonary nodule in the lung bases. No pleural effusion or pneumothorax demonstrated. Partially imaged heart size is normal. Hepatobiliary: No focal hepatic lesions. No intra or extrahepatic biliary ductal dilation. Cholecystectomy. Pancreas: No focal lesions or main ductal dilation. Spleen: Normal in size without focal abnormality. Adrenals/Urinary Tract: No adrenal nodules. No suspicious renal mass, calculi or hydronephrosis. No focal bladder wall thickening. Stomach/Bowel: Normal appearance of the stomach. Diffuse mural thickening of the sigmoid colon, where there is extensive diverticulosis. No abnormal bowel dilation. Normal appendix. Lymphatic: No enlarged abdominal or pelvic lymph nodes. Reproductive: No adnexal masses. Bilateral adnexal metallic radiodensities, likely related to tubal ligation. Other: Trace pelvic free fluid.  No free air or fluid collection. Musculoskeletal: No acute or abnormal lytic or blastic osseous lesions. Partially imaged postsurgical changes from right proximal femoral fixation. Hardware appears intact. Old fracture of the left superior pubic ramus. Multilevel degenerative changes of the partially imaged thoracic and lumbar spine. Unchanged wedging of L2 and L3. IMPRESSION: 1. No evidence of active GI bleed. 2. Diffuse mural thickening of the sigmoid colon, where there is extensive diverticulosis, likely reflecting sequela of prior diverticulitis. 3. 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 Recommend referral to or continued care with vascular specialist. (Ref.: J Vasc Surg. 2018; 67:2-77 and J Am Coll Radiol 2013;10(10):789-794.) 4.  Aortic Atherosclerosis (ICD10-I70.0). Electronically Signed   By: Agustin Cree M.D.   On: 04/12/2023 13:02      Impression / Plan:    Assessment: Active Problems:   Hypothyroid   COPD (chronic obstructive pulmonary disease) (HCC)   RA (rheumatoid arthritis) (HCC)   Pernicious anemia   Anemia associated with acute blood loss   Acute GI bleeding   Weight loss  Mild cognitive impairment with memory loss   [name] is a 82 y.o. y/o female with reports that she started to have rectal bleeding last night at 1 AM and has had no further bleeding today.  She had a CT angiography that did not show any active bleeding.  The patient has had AVMs in the past but had a CT scan showing significant diverticulosis of the colon that also can explain this patient's GI bleeding.  Plan:  The patient has been given the option of pursuing a GI workup including a colonoscopy due to the bright red blood per rectum and her multiple EGDs in the past showing nothing other than gastritis and some AVMs.  It is unlikely that the AVMs found in the previous procedure would explain amount of bleeding she had in such a short period of time.  A gastric ulcer with a visible vessel may account for such bleeding if it was present.  The patient has been offered a GI workup versus waiting and seeing.  The patient states that due to her age and her rheumatoid arthritis she would like to see if the bleeding recurs and treat it conservatively for the time being.  She will reconsider endoscopic evaluation if the bleeding should occur.  The patient has been explained the plan and agrees with it.  Thank you for involving me in the care of this patient.      LOS: 0 days   Midge Minium, MD, [facility] 04/12/2023, 2:42 PM,  Pager [phone] 7am-5pm  Check AMION for 5pm -7am coverage and on weekends   Note: This dictation was prepared with Dragon dictation along with smaller phrase technology. Any transcriptional errors that result from this process are unintentional.

## 2023-04-12 NOTE — Assessment & Plan Note (Signed)
-   Continue home Synthroid °

## 2023-04-12 NOTE — Assessment & Plan Note (Signed)
No shortness of breath reported at this time.  - Continue home bronchodilators - DuoNebs as needed

## 2023-04-12 NOTE — H&P (Addendum)
History and Physical    Patient: [name] ZOX:[id number] DOB: February 10, 1941 DOA: 04/12/2023 DOS: the patient was seen and examined on 04/12/2023 PCP: Danella Penton, MD  Patient coming from: Home  Chief Complaint:  Chief Complaint  Patient presents with   Rectal Bleeding   HPI: [name] is a 82 y.o. female with medical history significant of previous GI bleed in the setting of gastritis and duodenal ectasia s/p embolization, iron deficiency anemia, dementia, COPD, rheumatoid arthritis, B12 deficiency, hypothyroidism, who presents to the ED due to rectal bleeding.  [name] states that approximately at midnight, she had a large volume episode of bright red blood per rectum with clots present.  She states at that time, she was experiencing periumbilical and back pain, but notes that the back pain is chronic.  She tried to lay down on a heating pad but continued to have multiple episodes, approximately 7 of BRBPR throughout the night.  This morning, she felt weak throughout and so EMS was called.  She noted a short episode of nausea but no vomiting.  She denies any shortness of breath, chest pain, palpitations or focal weakness.  ED course: On arrival to the ED, patient was hypertensive at 146/58 with heart rate of 100.  She was saturating at 96% on room air.  She was afebrile at 97.8. Initial workup notable for hemoglobin of 8.5, potassium 3.0, glucose 185, calcium 8.1, albumin 3.2 with GFR above 60.  INR 1.2.  CTA of the abdomen was obtained with no active extravasation.  1 unit of PRBCs ordered and [facility] contacted for admission.  Review of Systems: As mentioned in the history of present illness. All other systems reviewed and are negative.  Past Medical History:  Diagnosis Date   Abdominal pain    Anemia    Anxiety    Arthritis, rheumatoid (HCC)    Breast pain    Chest discomfort    Chest pain    Chronic pain syndrome    Collagen vascular disease (HCC)    hx  rheumatoid arthritis.    Constipation    COPD (chronic obstructive pulmonary disease) (HCC)    Diverticulosis    Fibrocystic breast disease    Gastritis    GERD (gastroesophageal reflux disease)    Hemorrhoids    Hx of colonic polyps    Hypothyroidism    Osteopenia    PONV (postoperative nausea and vomiting)    Renal artery stenosis (HCC)    Right ankle pain    Thyroid binding globulin deficiency    Tobacco abuse    Past Surgical History:  Procedure Laterality Date   CHOLECYSTECTOMY     EMBOLIZATION (CATH LAB) N/A 01/22/2022   Procedure: EMBOLIZATION;  Surgeon: Renford Dills, MD;  Location: [facility];  Service: Cardiovascular;  Laterality: N/A;   ESOPHAGOGASTRODUODENOSCOPY (EGD) WITH PROPOFOL N/A 01/22/2022   Procedure: ESOPHAGOGASTRODUODENOSCOPY (EGD) WITH PROPOFOL;  Surgeon: Midge Minium, MD;  Location: [facility] ENDOSCOPY;  Service: Endoscopy;  Laterality: N/A;   ESOPHAGOGASTRODUODENOSCOPY (EGD) WITH PROPOFOL N/A 04/03/2022   Procedure: ESOPHAGOGASTRODUODENOSCOPY (EGD) WITH PROPOFOL;  Surgeon: Midge Minium, MD;  Location: [facility] ENDOSCOPY;  Service: Endoscopy;  Laterality: N/A;   IR RADIOLOGIST EVAL & MGMT  07/07/2022   JOINT REPLACEMENT     right hip   LEG SURGERY     TONSILLECTOMY     Social History:  reports that she has quit smoking. Her smoking use included cigarettes. She has never used smokeless tobacco. She reports that  she does not drink alcohol and does not use drugs.  Allergies  Allergen Reactions   Other Other (See Comments)    Uncoded Allergy. Allergen: symmetral Uncoded Allergy. Allergen: etrafon Uncoded Allergy. Allergen: desbutal Uncoded Allergy. Allergen: symmetral Uncoded Allergy. Allergen: etrafon Uncoded Allergy. Allergen: desbutal Other reaction(s): Unknown Uncoded Allergy. Allergen: symmetral Other reaction(s): Unknown Uncoded Allergy. Allergen: etrafon Uncoded Allergy. Allergen: symmetral Uncoded Allergy. Allergen: etrafon Uncoded  Allergy. Allergen: desbutal    Sulfa Antibiotics     Other reaction(s): Unknown Uncoded Allergy. Allergen: desbutal   Etrafon [Perphenazine-Amitriptyline]    Hydrocodone Nausea And Vomiting   Mirtazapine     Other reaction(s): Hallucination   Denosumab Itching   Doxycycline Nausea Only   Oxycodone-Acetaminophen Nausea And Vomiting and Other (See Comments)    Other reaction(s): Other (See Comments)   Percocet [Oxycodone-Acetaminophen] Nausea And Vomiting   Sertraline Palpitations    Family History  Problem Relation Age of Onset   Heart disease Sister    Hypertension Brother    Heart attack Brother    Hyperlipidemia Brother    Breast cancer Neg Hx     Prior to Admission medications   Medication Sig Start Date End Date Taking? Authorizing Provider  acetaminophen-codeine (TYLENOL #3) 300-30 MG tablet Take 1 tablet by mouth every 6 (six) hours as needed. 06/16/22   [provider]  ASPIRIN 81 PO 1 tablet DAILY (route: oral) 03/04/22   [provider]  atorvastatin (LIPITOR) 10 MG tablet Take 10 mg by mouth daily. 07/22/21   [provider]  buPROPion (WELLBUTRIN XL) 150 MG 24 hr tablet Take 150 mg by mouth daily. 11/22/19   [provider]  dicyclomine (BENTYL) 10 MG capsule Take 10 mg by mouth 2 (two) times daily as needed. Patient not taking: Reported on 06/30/2022 09/30/21   [provider]  dicyclomine (BENTYL) 10 MG capsule Take 1 capsule by mouth 2 (two) times daily. 07/20/22 07/20/23  [provider]  DULoxetine (CYMBALTA) 60 MG capsule Take 60 mg by mouth daily.    [provider]  etodolac (LODINE) 400 MG tablet Take 400 mg by mouth 2 (two) times daily. Patient not taking: Reported on 06/30/2022 03/09/22   [provider]  folic acid (FOLVITE) 1 MG tablet Take 1 tablet by mouth daily. 10/21/14   [provider]  galantamine (RAZADYNE) 8 MG tablet Take 8 mg by mouth daily.    [provider]   levothyroxine (SYNTHROID) 50 MCG tablet Take 50 mcg by mouth daily.     [provider]  lidocaine (LIDODERM) 5 % Place 1 patch onto the skin every 12 (twelve) hours. Remove & Discard patch within 12 hours or as directed by MD 07/13/22 07/13/23  Chesley Noon, MD  methotrexate 250 MG/10ML injection ADMINISTER 0.8 ML ONCE A WEEK 02/27/20   [provider]  omeprazole (PRILOSEC) 40 MG capsule Take 40 mg by mouth 2 (two) times daily. 04/20/19   [provider]  senna-docusate (SENOKOT-S) 8.6-50 MG tablet Take 2 tablets by mouth 2 (two) times daily as needed for mild constipation or moderate constipation. 01/27/22   Sunnie Nielsen, DO  tiZANidine (ZANAFLEX) 2 MG tablet Take 2 mg by mouth 2 (two) times daily. 12/03/19   [provider]    Physical Exam: Vitals:   04/12/23 1926 04/12/23 2057 04/12/23 2100 04/12/23 2132  BP: (!) 126/97 123/72 122/78 117/75  Pulse: 87 89 84 91  Resp: 18 20 18 17   Temp:    98.3  F (36.8 C)  TempSrc:    Oral  SpO2: 97% 96% 98% 99%  Weight:      Height:       Physical Exam Vitals and nursing note reviewed.  Constitutional:      Appearance: She is underweight.  HENT:     Head: Normocephalic and atraumatic.     Mouth/Throat:     Mouth: Mucous membranes are moist.     Pharynx: Oropharynx is clear.     Comments: Small aphthous ulcer on the left upper gumline Cardiovascular:     Rate and Rhythm: Normal rate. Rhythm irregular.     Heart sounds: No murmur heard.    Comments: Frequent PVC on telemetry while examining patient Pulmonary:     Effort: Pulmonary effort is normal. Prolonged expiration present. No respiratory distress.     Breath sounds: Normal breath sounds. No wheezing or rales.  Abdominal:     General: Bowel sounds are normal.     Palpations: Abdomen is soft.     Tenderness: There is abdominal tenderness (Periumbilical). There is no guarding.  Musculoskeletal:     Right lower leg: No edema.     Left lower  leg: No edema.  Skin:    General: Skin is warm and dry.  Neurological:     Mental Status: She is alert.     Comments:  Patient is alert and oriented to person, place and situation.  No overt memory deficits noted on examination.  Psychiatric:        Mood and Affect: Mood normal.        Behavior: Behavior normal.    Data Reviewed: CBC with WBC of 8.9, hemoglobin of 8.5, MCV of 97, platelets of 342 CMP with sodium of 137, potassium 3.0, bicarb 25, glucose 185, BUN 22, Gran 0.87, AST 22, ALT 17 and GFR above 60 Magnesium 2.1  EKG personally reviewed.  Sinus rhythm with multiple PVCs.  Rate of 102.  CT ANGIO GI BLEED  Result Date: 04/12/2023 CLINICAL DATA:  Brisk bright red blood per rectum overnight EXAM: CTA ABDOMEN AND PELVIS WITHOUT AND WITH CONTRAST TECHNIQUE: Multidetector CT imaging of the abdomen and pelvis was performed using the standard protocol during bolus administration of intravenous contrast. Multiplanar reconstructed images and MIPs were obtained and reviewed to evaluate the vascular anatomy. RADIATION DOSE REDUCTION: This exam was performed according to the departmental dose-optimization program which includes automated exposure control, adjustment of the mA and/or kV according to patient size and/or use of iterative reconstruction technique. CONTRAST:  80mL OMNIPAQUE IOHEXOL 350 MG/ML SOLN COMPARISON:  CT pelvis dated 10/25/2022, CT abdomen and pelvis dated 07/13/2022 FINDINGS: VASCULAR Aorta: Aortic atherosclerosis with segmental irregularity, most notably in the infrarenal aorto bi-iliac region. No dissection, vasculitis, or significant stenosis. Focal saccular outpouching arising from the right lateral aspect of the suprarenal aorta measures 8 x 4 mm (8:40). Celiac: Patent without evidence of aneurysm, dissection, vasculitis or significant stenosis. SMA: Right hepatic artery arises from the SMA. Patent without evidence of aneurysm, dissection, vasculitis or significant  stenosis. Renals: 2 renal arteries bilaterally. Mild narrowing of the left renal artery origins due to atherosclerotic plaque. No evidence of aneurysm, dissection, or vasculitis. IMA: Patent without evidence of aneurysm, dissection, vasculitis or significant stenosis. Inflow: Segmental mild luminal narrowing of the bilateral common iliac arteries due to atherosclerotic plaque. No evidence of aneurysm, dissection, or vasculitis. Proximal Outflow: Bilateral common femoral and visualized portions of the superficial and profunda femoral arteries are patent without evidence of  aneurysm, dissection, vasculitis or significant stenosis. Veins: No obvious venous abnormality. Retroaortic left renal vein. Review of the MIP images confirms the above findings. NON-VASCULAR Lower chest: No focal consolidation or pulmonary nodule in the lung bases. No pleural effusion or pneumothorax demonstrated. Partially imaged heart size is normal. Hepatobiliary: No focal hepatic lesions. No intra or extrahepatic biliary ductal dilation. Cholecystectomy. Pancreas: No focal lesions or main ductal dilation. Spleen: Normal in size without focal abnormality. Adrenals/Urinary Tract: No adrenal nodules. No suspicious renal mass, calculi or hydronephrosis. No focal bladder wall thickening. Stomach/Bowel: Normal appearance of the stomach. Diffuse mural thickening of the sigmoid colon, where there is extensive diverticulosis. No abnormal bowel dilation. Normal appendix. Lymphatic: No enlarged abdominal or pelvic lymph nodes. Reproductive: No adnexal masses. Bilateral adnexal metallic radiodensities, likely related to tubal ligation. Other: Trace pelvic free fluid.  No free air or fluid collection. Musculoskeletal: No acute or abnormal lytic or blastic osseous lesions. Partially imaged postsurgical changes from right proximal femoral fixation. Hardware appears intact. Old fracture of the left superior pubic ramus. Multilevel degenerative changes of the  partially imaged thoracic and lumbar spine. Unchanged wedging of L2 and L3. IMPRESSION: 1. No evidence of active GI bleed. 2. Diffuse mural thickening of the sigmoid colon, where there is extensive diverticulosis, likely reflecting sequela of prior diverticulitis. 3. 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 Recommend referral to or continued care with vascular specialist. (Ref.: J Vasc Surg. 2018; 67:2-77 and J Am Coll Radiol 2013;10(10):789-794.) 4.  Aortic Atherosclerosis (ICD10-I70.0). Electronically Signed   By: Agustin Cree M.D.   On: 04/12/2023 13:02    Results are pending, will review when available.  Assessment and Plan:  * Acute GI bleeding Patient is presenting with sudden onset bright red blood per rectum that began approximately 12 hours ago.  Previous history of upper GI bleed in the setting of gastritis and duodenal ectasia that required embolization.  CTA with no active extravasation at this time.  - GI consulted; appreciate their recommendations - Telemetry monitoring - S/p Protonix 80 mg IV once - Continue Protonix 40 mg IV twice daily  Acute blood loss anemia Previous hemoglobin of 12.0 approximately 5 weeks ago, currently 8.5.  Given tachycardia, 1 unit of packed RBC ordered in the ED.  - Transfuse 1 unit of packed RBC - Posttransfusion CBC - Continue to transfuse for hemoglobin less than 7 or hemodynamic instability  Mild cognitive impairment with memory loss - Continue home galantamine and rivastigmine  Weight loss Chronic issue that patient has been following with GI at [facility].  - Dietitian consulted  Pernicious anemia Receives outpatient B12 injections.  Last B12 above 1500 approximately 3 months ago.  RA (rheumatoid arthritis) (HCC) Patient previously on methotrexate, Simponi and Cimzia injections per chart review. Holding at this time.   COPD (chronic obstructive  pulmonary disease) (HCC) No shortness of breath reported at this time.  - Continue home bronchodilators - DuoNebs as needed  Hypothyroid - Continue home Synthroid  Advance Care Planning:   Code Status: Full Code verified by patient  Consults: GI  Family Communication: No family at bedside, will update brother via telephone per patient's request.   Severity of Illness: The appropriate patient status for this patient is INPATIENT. Inpatient status is judged to be reasonable and necessary in order to provide the required intensity of service to ensure the patient's safety. The patient's presenting symptoms, physical exam  findings, and initial radiographic and laboratory data in the context of their chronic comorbidities is felt to place them at high risk for further clinical deterioration. Furthermore, it is not anticipated that the patient will be medically stable for discharge from the hospital within 2 midnights of admission.   * I certify that at the point of admission it is my clinical judgment that the patient will require inpatient hospital care spanning beyond 2 midnights from the point of admission due to high intensity of service, high risk for further deterioration and high frequency of surveillance required.*  Author: Verdene Lennert, MD 04/12/2023 9:35 PM  For on call review [email or web address].

## 2023-04-12 NOTE — Assessment & Plan Note (Signed)
Patient is presenting with sudden onset bright red blood per rectum that began approximately 12 hours ago.  Previous history of upper GI bleed in the setting of gastritis and duodenal ectasia that required embolization.  CTA with no active extravasation at this time.  - GI consulted; appreciate their recommendations - Telemetry monitoring - S/p Protonix 80 mg IV once - Continue Protonix 40 mg IV twice daily

## 2023-04-13 DIAGNOSIS — K922 Gastrointestinal hemorrhage, unspecified: Secondary | ICD-10-CM | POA: Diagnosis not present

## 2023-04-13 LAB — BASIC METABOLIC PANEL
Anion gap: 4 — ABNORMAL LOW (ref 5–15)
BUN: 14 mg/dL (ref 8–23)
CO2: 23 mmol/L (ref 22–32)
Calcium: 7.4 mg/dL — ABNORMAL LOW (ref 8.9–10.3)
Chloride: 111 mmol/L (ref 98–111)
Creatinine, Ser: 0.73 mg/dL (ref 0.44–1.00)
GFR, Estimated: >60 mL/min (ref >60)
Glucose, Bld: 99 mg/dL (ref 70–99)
Potassium: 3.4 mmol/L — ABNORMAL LOW (ref 3.5–5.1)
Sodium: 138 mmol/L (ref 135–145)

## 2023-04-13 LAB — CBC WITH DIFFERENTIAL/PLATELET
Abs Immature Granulocytes: 0.03 10*3/uL (ref 0.00–0.07)
Basophils Absolute: 0 10*3/uL (ref 0.0–0.1)
Basophils Relative: 1 %
Eosinophils Absolute: 0.3 10*3/uL (ref 0.0–0.5)
Eosinophils Relative: 4 %
HCT: 22.1 % — ABNORMAL LOW (ref 36.0–46.0)
Hemoglobin: 7.7 g/dL — ABNORMAL LOW (ref 12.0–15.0)
Immature Granulocytes: 1 %
Lymphocytes Relative: 25 %
Lymphs Abs: 1.6 10*3/uL (ref 0.7–4.0)
MCH: 32.2 pg (ref 26.0–34.0)
MCHC: 34.8 g/dL (ref 30.0–36.0)
MCV: 92.5 fL (ref 80.0–100.0)
Monocytes Absolute: 0.7 10*3/uL (ref 0.1–1.0)
Monocytes Relative: 11 %
Neutro Abs: 3.7 10*3/uL (ref 1.7–7.7)
Neutrophils Relative %: 58 %
Platelets: 222 10*3/uL (ref 150–400)
RBC: 2.39 MIL/uL — ABNORMAL LOW (ref 3.87–5.11)
RDW: 16.2 % — ABNORMAL HIGH (ref 11.5–15.5)
WBC: 6.3 10*3/uL (ref 4.0–10.5)
nRBC: 0 % (ref 0.0–0.2)

## 2023-04-13 LAB — PHOSPHORUS: Phosphorus: 3.5 mg/dL (ref 2.5–4.6)

## 2023-04-13 LAB — MAGNESIUM: Magnesium: 2 mg/dL (ref 1.7–2.4)

## 2023-04-13 LAB — PREPARE RBC (CROSSMATCH)

## 2023-04-13 MED ORDER — POTASSIUM CHLORIDE CRYS ER 20 MEQ PO TBCR
40.0000 meq | EXTENDED_RELEASE_TABLET | ORAL | Status: AC
  Administered 2023-04-13: 40 meq via ORAL
  Filled 2023-04-13: qty 2

## 2023-04-13 MED ORDER — PEG 3350-KCL-NA BICARB-NACL 420 G PO SOLR
4000.0000 mL | Freq: Once | ORAL | Status: AC
  Administered 2023-04-13: 4000 mL via ORAL
  Filled 2023-04-13: qty 4000

## 2023-04-13 MED ORDER — PANTOPRAZOLE SODIUM 40 MG IV SOLR
40.0000 mg | Freq: Two times a day (BID) | INTRAVENOUS | Status: DC
  Administered 2023-04-13 – 2023-04-16 (×7): 40 mg via INTRAVENOUS
  Filled 2023-04-13 (×7): qty 10

## 2023-04-13 MED ORDER — SODIUM CHLORIDE 0.9% IV SOLUTION: Freq: Once | INTRAVENOUS | Status: AC

## 2023-04-13 NOTE — Consult Note (Signed)
PHARMACY CONSULT NOTE - ELECTROLYTES  Pharmacy Consult for Electrolyte Monitoring and Replacement   Recent Labs: Height: 5\' 4"  (162.6 cm) Weight: 47.4 kg (104 lb 8 oz) IBW/kg (Calculated) : 54.7 Estimated Creatinine Clearance: 41.3 mL/min (by C-G formula based on SCr of 0.73 mg/dL). Potassium (mmol/L)  Date Value  04/13/2023 3.4 (L)   Magnesium (mg/dL)  Date Value  16/03/9603 2.0   Calcium (mg/dL)  Date Value  54/02/8118 7.4 (L)   Albumin (g/dL)  Date Value  14/78/2956 3.2 (L)   Phosphorus (mg/dL)  Date Value  21/30/8657 3.5   Sodium (mmol/L)  Date Value  04/13/2023 138   Corrected Ca: 8.74 mg/dL  Assessment  [name] is a 82 y.o. female presenting with rectal bleeding. PMH significant for previous GI bleed ISO gastritis and duodenal ectasia s/p embolization, iron deficiency anemia, COPD, hypothyroidism and rheumatoid arthritis. Pharmacy has been consulted to monitor and replace electrolytes.  Diet: NPO  Goal of Therapy: Electrolytes WNL  Plan:  K 3.5, provider order Kcl 40 mEq q4h x 2 ordered by provider Check BMP with AM labs  Thank you for allowing pharmacy to be a part of this patient's care.  Barrie Folk, PharmD Clinical Pharmacist 04/13/2023 10:45 AM

## 2023-04-13 NOTE — Progress Notes (Addendum)
Midge Minium, MD [facility]   [address], [address] [place], [place] [address] Phone: [phone] Fax : [phone]   Subjective: The patient started to have passage of bright red blood with clots last night after I had seen her and we had discussed holding off on any procedures to see if she had any further bleeding.  The patient did drop her hemoglobin significantly and was transfused.   Objective: Vital signs in last 24 hours: Vitals:   04/13/23 0256 04/13/23 0353 04/13/23 0510 04/13/23 0528  BP: 115/72 99/63 111/77 91/61  Pulse: 96 97 89 84  Resp: 18 16 16 16   Temp: 97.6 F (36.4 C) 98.6 F (37 C) 98.3 F (36.8 C) 98.6 F (37 C)  TempSrc: Oral Oral Oral Oral  SpO2: 97% 98% 98% 100%  Weight:      Height:       Weight change:   Intake/Output Summary (Last 24 hours) at 04/13/2023 1308 Last data filed at 04/12/2023 1355 Gross per 24 hour  Intake 500 ml  Output --  Net 500 ml     Exam: Heart:: Regular rate and rhythm or without murmur or extra heart sounds Lungs: normal Abdomen: soft, nontender, normal bowel sounds   Lab Results: @LABTEST2 @ Micro Results: No results found for this or any previous visit (from the past 240 hour(s)). Studies/Results: CT ANGIO GI BLEED  Result Date: 04/12/2023 CLINICAL DATA:  Brisk bright red blood per rectum overnight EXAM: CTA ABDOMEN AND PELVIS WITHOUT AND WITH CONTRAST TECHNIQUE: Multidetector CT imaging of the abdomen and pelvis was performed using the standard protocol during bolus administration of intravenous contrast. Multiplanar reconstructed images and MIPs were obtained and reviewed to evaluate the vascular anatomy. RADIATION DOSE REDUCTION: This exam was performed according to the departmental dose-optimization program which includes automated exposure control, adjustment of the mA and/or kV according to patient size and/or use of iterative reconstruction technique. CONTRAST:  80mL OMNIPAQUE IOHEXOL 350 MG/ML SOLN  COMPARISON:  CT pelvis dated 10/25/2022, CT abdomen and pelvis dated 07/13/2022 FINDINGS: VASCULAR Aorta: Aortic atherosclerosis with segmental irregularity, most notably in the infrarenal aorto bi-iliac region. No dissection, vasculitis, or significant stenosis. Focal saccular outpouching arising from the right lateral aspect of the suprarenal aorta measures 8 x 4 mm (8:40). Celiac: Patent without evidence of aneurysm, dissection, vasculitis or significant stenosis. SMA: Right hepatic artery arises from the SMA. Patent without evidence of aneurysm, dissection, vasculitis or significant stenosis. Renals: 2 renal arteries bilaterally. Mild narrowing of the left renal artery origins due to atherosclerotic plaque. No evidence of aneurysm, dissection, or vasculitis. IMA: Patent without evidence of aneurysm, dissection, vasculitis or significant stenosis. Inflow: Segmental mild luminal narrowing of the bilateral common iliac arteries due to atherosclerotic plaque. No evidence of aneurysm, dissection, or vasculitis. Proximal Outflow: Bilateral common femoral and visualized portions of the superficial and profunda femoral arteries are patent without evidence of aneurysm, dissection, vasculitis or significant stenosis. Veins: No obvious venous abnormality. Retroaortic left renal vein. Review of the MIP images confirms the above findings. NON-VASCULAR Lower chest: No focal consolidation or pulmonary nodule in the lung bases. No pleural effusion or pneumothorax demonstrated. Partially imaged heart size is normal. Hepatobiliary: No focal hepatic lesions. No intra or extrahepatic biliary ductal dilation. Cholecystectomy. Pancreas: No focal lesions or main ductal dilation. Spleen: Normal in size without focal abnormality. Adrenals/Urinary Tract: No adrenal nodules. No suspicious renal mass, calculi or hydronephrosis. No focal bladder wall thickening. Stomach/Bowel: Normal appearance of the stomach. Diffuse mural thickening  of  the sigmoid colon, where there is extensive diverticulosis. No abnormal bowel dilation. Normal appendix. Lymphatic: No enlarged abdominal or pelvic lymph nodes. Reproductive: No adnexal masses. Bilateral adnexal metallic radiodensities, likely related to tubal ligation. Other: Trace pelvic free fluid.  No free air or fluid collection. Musculoskeletal: No acute or abnormal lytic or blastic osseous lesions. Partially imaged postsurgical changes from right proximal femoral fixation. Hardware appears intact. Old fracture of the left superior pubic ramus. Multilevel degenerative changes of the partially imaged thoracic and lumbar spine. Unchanged wedging of L2 and L3. IMPRESSION: 1. No evidence of active GI bleed. 2. Diffuse mural thickening of the sigmoid colon, where there is extensive diverticulosis, likely reflecting sequela of prior diverticulitis. 3. 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 Recommend referral to or continued care with vascular specialist. (Ref.: J Vasc Surg. 2018; 67:2-77 and J Am Coll Radiol 2013;10(10):789-794.) 4.  Aortic Atherosclerosis (ICD10-I70.0). Electronically Signed   By: Agustin Cree M.D.   On: 04/12/2023 13:02   Medications: I have reviewed the patient's current medications. Scheduled Meds:  atorvastatin  10 mg Oral Daily   DULoxetine  20 mg Oral BID   feeding supplement  1 Container Oral TID BM   folic acid  1 mg Oral Daily   levothyroxine  50 mcg Oral Q0600   pantoprazole (PROTONIX) IV  40 mg Intravenous Q12H   rivastigmine  3 mg Oral BID   sodium chloride flush  3 mL Intravenous Q12H   tiZANidine  2 mg Oral BID   Continuous Infusions: PRN Meds:.acetaminophen **OR** [DISCONTINUED] acetaminophen, ondansetron **OR** ondansetron (ZOFRAN) IV   Assessment: Principal Problem:   Acute GI bleeding Active Problems:   Hypothyroid   COPD (chronic obstructive pulmonary disease)  (HCC)   RA (rheumatoid arthritis) (HCC)   Pernicious anemia   Acute blood loss anemia   Weight loss   Mild cognitive impairment with memory loss    Plan: Due to the patient having a recurrence of her bleeding the patient has agreed to undergo an endoscopic evaluation with an EGD and colonoscopy.  The patient will be prepped today and set up for the EGD and colonoscopy for tomorrow.  Agree with transfuse as needed.  The patient has been explained the plan and agrees with it.  If the patient should have another episode of acute GI bleeding the patient should have a repeat CT angio to try to localize where the bleeding may be coming from.   LOS: 1 day   Sherlyn Hay 04/13/2023, 8:11 AM Pager [phone] 7am-5pm  Check AMION for 5pm -7am coverage and on weekends

## 2023-04-13 NOTE — Progress Notes (Signed)
Progress Note   Patient: [name] ZOX:[id number] DOB: 1940-10-27 DOA: 04/12/2023     1 DOS: the patient was seen and examined on 04/13/2023   Brief hospital course: Vy June Penafiel is a 82 y.o. female with medical history significant of previous GI bleed in the setting of gastritis and duodenal ectasia s/p embolization, iron deficiency anemia, dementia, COPD, rheumatoid arthritis, B12 deficiency, hypothyroidism, who presents to the ED due to rectal bleeding.   Assessment and Plan:   Acute GI bleeding Gastroenterologist on board and case discussed currently planning endoscopy tomorrow - Telemetry monitoring Continue Protonix 40 mg IV twice daily   Acute blood loss anemia Previous hemoglobin of 12.0 approximately 5 weeks ago, currently 8.5.  Given tachycardia, 1 unit of packed RBC ordered in the ED. Status post 1 unit of packed RBC transfusion Hemoglobin noted to have down trended from 9.1-7.7 today Continue to transfuse for hemoglobin less than 7 or hemodynamic instability   Mild cognitive impairment with memory loss - Continue home galantamine and rivastigmine   Weight loss Chronic issue that patient has been following with GI at [facility]. Dietitian consulted   Pernicious anemia Receives outpatient B12 injections.  Last B12 above 1500 approximately 3 months ago.   RA (rheumatoid arthritis) (HCC) Patient previously on methotrexate, Simponi and Cimzia injections per chart review. Holding at this time.    COPD (chronic obstructive pulmonary disease) (HCC) Continue as needed nebulization   Hypothyroid Continue Synthroid   Advance Care Planning:   Code Status: Full Code verified by patient   Consults: GI   Family Communication: No family at bedside, will update brother via telephone per patient's request.      Subjective:  Patient seen and examined at bedside this morning Denies any bleeding per rectum this morning According to her she had Episode of Bleeding  Yesterday Gastroenterologist planning on EGD/colonoscopy tomorrow after bowel preparation   Physical Exam:   Appearance: She is underweight.  HENT:     Head: Normocephalic and atraumatic.     Mouth/Throat:     Mouth: Mucous membranes are moist.     Pharynx: Oropharynx is clear.     Comments: Small aphthous ulcer on the left upper gumline Cardiovascular:     Rate and Rhythm: Frequent PVCs noted Pulmonary:     Effort: Pulmonary effort is normal.    Breath sounds: Normal breath sounds. No wheezing or rales.  Abdominal:     General: Bowel sounds are normal.     Palpations: Abdomen is soft.  Musculoskeletal:     Right lower leg: No edema.     Left lower leg: No edema.  Skin:    General: Skin is warm and dry.  Neurological:     Mental Status: She is alert.   Vitals:   04/13/23 0353 04/13/23 0510 04/13/23 0528 04/13/23 0915  BP: 99/63 111/77 91/61 113/73  Pulse: 97 89 84 76  Resp: 16 16 16 18   Temp: 98.6 F (37 C) 98.3 F (36.8 C) 98.6 F (37 C) 98.2 F (36.8 C)  TempSrc: Oral Oral Oral Oral  SpO2: 98% 98% 100% 100%  Weight:      Height:        Data Reviewed:     Latest Ref Rng & Units 04/13/2023    3:28 AM 04/12/2023    5:17 PM 04/12/2023    9:10 AM  CBC  WBC 4.0 - 10.5 K/uL 6.3  6.6  8.9   Hemoglobin 12.0 - 15.0 g/dL 7.7  9.1  8.5   Hematocrit 36.0 - 46.0 % 22.1  27.0  25.7   Platelets 150 - 400 K/uL 222  218  342        Latest Ref Rng & Units 04/13/2023    3:28 AM 04/12/2023    9:10 AM 07/13/2022    7:19 AM  BMP  Glucose 70 - 99 mg/dL 99  580  98   BUN 8 - 23 mg/dL 14  22  17    Creatinine 0.44 - 1.00 mg/dL 9.98  3.38  2.50   Sodium 135 - 145 mmol/L 138  137  138   Potassium 3.5 - 5.1 mmol/L 3.4  3.0  3.8   Chloride 98 - 111 mmol/L 111  105  99   CO2 22 - 32 mmol/L 23  25  28    Calcium 8.9 - 10.3 mg/dL 7.4  8.1  8.6     CT angio of the abdomen with GI bleed protocol did not show any evidence of acute bleeding   Time spent: 55 minutes  Author: Loyce Dys, MD 04/13/2023 4:05 PM  For on call review [email or web address].

## 2023-04-13 NOTE — Progress Notes (Signed)
Pt continues to have rectal bleeding, she has had 5 bright red stools with clots. Current bp 115/72, Hr 98 but did elevate up to 140 when she goes to the bathroom and goes back to the 90's at rest. Dr. Geradine Girt made aware, order given for stat labs.

## 2023-04-13 NOTE — Plan of Care (Signed)
  Problem: Nutrition: Goal: Adequate nutrition will be maintained Outcome: Progressing   Problem: Safety: Goal: Ability to remain free from injury will improve Outcome: Progressing   

## 2023-04-14 ENCOUNTER — Inpatient Hospital Stay: Payer: Medicare Other | Admitting: Anesthesiology

## 2023-04-14 ENCOUNTER — Encounter
Admission: EM | Disposition: A | Discharge status: Home / Self Care | Payer: Self-pay | Source: Home / Self Care | Attending: Internal Medicine

## 2023-04-14 ENCOUNTER — Other Ambulatory Visit: Payer: Self-pay

## 2023-04-14 DIAGNOSIS — K921 Melena: Secondary | ICD-10-CM | POA: Diagnosis not present

## 2023-04-14 DIAGNOSIS — K922 Gastrointestinal hemorrhage, unspecified: Secondary | ICD-10-CM | POA: Diagnosis not present

## 2023-04-14 DIAGNOSIS — K5730 Diverticulosis of large intestine without perforation or abscess without bleeding: Secondary | ICD-10-CM | POA: Diagnosis not present

## 2023-04-14 HISTORY — PX: ESOPHAGOGASTRODUODENOSCOPY (EGD) WITH PROPOFOL: SHX5813

## 2023-04-14 HISTORY — PX: COLONOSCOPY WITH PROPOFOL: SHX5780

## 2023-04-14 LAB — CBC WITH DIFFERENTIAL/PLATELET
Abs Immature Granulocytes: 0.02 10*3/uL (ref 0.00–0.07)
Basophils Absolute: 0 10*3/uL (ref 0.0–0.1)
Basophils Relative: 0 %
Eosinophils Absolute: 0.3 10*3/uL (ref 0.0–0.5)
Eosinophils Relative: 4 %
HCT: 21.2 % — ABNORMAL LOW (ref 36.0–46.0)
Hemoglobin: 7.2 g/dL — ABNORMAL LOW (ref 12.0–15.0)
Immature Granulocytes: 0 %
Lymphocytes Relative: 21 %
Lymphs Abs: 1.5 10*3/uL (ref 0.7–4.0)
MCH: 31.3 pg (ref 26.0–34.0)
MCHC: 34 g/dL (ref 30.0–36.0)
MCV: 92.2 fL (ref 80.0–100.0)
Monocytes Absolute: 0.7 10*3/uL (ref 0.1–1.0)
Monocytes Relative: 10 %
Neutro Abs: 4.4 10*3/uL (ref 1.7–7.7)
Neutrophils Relative %: 65 %
Platelets: 186 10*3/uL (ref 150–400)
RBC: 2.3 MIL/uL — ABNORMAL LOW (ref 3.87–5.11)
RDW: 16.9 % — ABNORMAL HIGH (ref 11.5–15.5)
WBC: 6.8 10*3/uL (ref 4.0–10.5)
nRBC: 0 % (ref 0.0–0.2)

## 2023-04-14 LAB — BASIC METABOLIC PANEL
Anion gap: 5 (ref 5–15)
BUN: 13 mg/dL (ref 8–23)
CO2: 22 mmol/L (ref 22–32)
Calcium: 7.7 mg/dL — ABNORMAL LOW (ref 8.9–10.3)
Chloride: 110 mmol/L (ref 98–111)
Creatinine, Ser: 0.73 mg/dL (ref 0.44–1.00)
GFR, Estimated: >60 mL/min (ref >60)
Glucose, Bld: 83 mg/dL (ref 70–99)
Potassium: 3.9 mmol/L (ref 3.5–5.1)
Sodium: 137 mmol/L (ref 135–145)

## 2023-04-14 LAB — PREPARE RBC (CROSSMATCH)

## 2023-04-14 SURGERY — ESOPHAGOGASTRODUODENOSCOPY (EGD) WITH PROPOFOL
Anesthesia: General

## 2023-04-14 MED ORDER — SODIUM CHLORIDE 0.9 % IV SOLN: INTRAVENOUS | Status: DC | PRN

## 2023-04-14 MED ORDER — LIDOCAINE HCL (CARDIAC) PF 100 MG/5ML IV SOSY
PREFILLED_SYRINGE | INTRAVENOUS | Status: DC | PRN
  Administered 2023-04-14: 100 mg via INTRAVENOUS

## 2023-04-14 MED ORDER — ADULT MULTIVITAMIN W/MINERALS CH
1.0000 | ORAL_TABLET | Freq: Every day | ORAL | Status: DC
  Administered 2023-04-15 – 2023-04-16 (×2): 1 via ORAL
  Filled 2023-04-14 (×2): qty 1

## 2023-04-14 MED ORDER — PROPOFOL 10 MG/ML IV BOLUS
INTRAVENOUS | Status: DC | PRN
  Administered 2023-04-14: 50 mg via INTRAVENOUS
  Administered 2023-04-14: 80 ug/kg/min via INTRAVENOUS

## 2023-04-14 MED ORDER — SODIUM CHLORIDE 0.9% IV SOLUTION: Freq: Once | INTRAVENOUS | Status: AC

## 2023-04-14 NOTE — Anesthesia Postprocedure Evaluation (Signed)
Anesthesia Post Note  Patient: [name]  Procedure(s) Performed: ESOPHAGOGASTRODUODENOSCOPY (EGD) WITH PROPOFOL COLONOSCOPY WITH PROPOFOL  Patient location during evaluation: Endoscopy Anesthesia Type: General Level of consciousness: awake and alert Pain management: pain level controlled Vital Signs Assessment: post-procedure vital signs reviewed and stable Respiratory status: spontaneous breathing, nonlabored ventilation, respiratory function stable and patient connected to nasal cannula oxygen Cardiovascular status: blood pressure returned to baseline and stable Postop Assessment: no apparent nausea or vomiting Anesthetic complications: no   There were no known notable events for this encounter.   Last Vitals:  Vitals:   04/14/23 1153 04/14/23 1202  BP: 130/87 126/70  Pulse: 75 72  Resp: 17 15  Temp:    SpO2: 100% 100%    Last Pain:  Vitals:   04/14/23 1202  TempSrc:   PainSc: 5                  Corinda Gubler

## 2023-04-14 NOTE — Anesthesia Preprocedure Evaluation (Signed)
Anesthesia Evaluation  Patient identified by MRN, date of birth, ID band Patient awake  General Assessment Comment:  Patient AO x 3, has understanding of what is happening today. She expresses concerns of anesthesia's effect on her dementia. I deem her to have capacity to consent for this anesthetic today.  Reviewed: Allergy & Precautions, NPO status , Patient's Chart, lab work & pertinent test results  History of Anesthesia Complications (+) PONV and history of anesthetic complications  Airway Mallampati: II  TM Distance: >3 FB Neck ROM: Full    Dental  (+) Upper Dentures, Edentulous Lower   Pulmonary neg sleep apnea, COPD, Patient abstained from smoking.Not current smoker, former smoker   Pulmonary exam normal breath sounds clear to auscultation       Cardiovascular Exercise Tolerance: Good METS(-) hypertension+ Peripheral Vascular Disease  (-) CAD and (-) Past MI (-) dysrhythmias  Rhythm:Regular Rate:Normal - Systolic murmurs    Neuro/Psych  PSYCHIATRIC DISORDERS Anxiety    Dementia TIA   GI/Hepatic ,GERD  ,,(+)     (-) substance abuse  Denies N/V   Endo/Other  neg diabetesHypothyroidism    Renal/GU negative Renal ROS     Musculoskeletal   Abdominal   Peds  Hematology   Anesthesia Other Findings Past Medical History: No date: Abdominal pain No date: Anemia No date: Anxiety No date: Arthritis, rheumatoid (HCC) No date: Breast pain No date: Chest discomfort No date: Chest pain No date: Chronic pain syndrome No date: Collagen vascular disease (HCC)     Comment:  hx rheumatoid arthritis.  No date: Constipation No date: COPD (chronic obstructive pulmonary disease) (HCC) No date: Diverticulosis No date: Fibrocystic breast disease No date: Gastritis No date: GERD (gastroesophageal reflux disease) No date: Hemorrhoids No date: Hx of colonic polyps No date: Hypothyroidism No date: Osteopenia No date:  PONV (postoperative nausea and vomiting) No date: Renal artery stenosis (HCC) No date: Right ankle pain No date: Thyroid binding globulin deficiency No date: Tobacco abuse  Reproductive/Obstetrics                             Anesthesia Physical Anesthesia Plan  ASA: 3  Anesthesia Plan: General   Post-op Pain Management: Minimal or no pain anticipated   Induction: Intravenous  PONV Risk Score and Plan: 4 or greater and Propofol infusion, TIVA, Ondansetron and Treatment may vary due to age or medical condition  Airway Management Planned: Nasal Cannula  Additional Equipment: None  Intra-op Plan:   Post-operative Plan:   Informed Consent: I have reviewed the patients History and Physical, chart, labs and discussed the procedure including the risks, benefits and alternatives for the proposed anesthesia with the patient or authorized representative who has indicated his/her understanding and acceptance.     Dental advisory given  Plan Discussed with: CRNA and Surgeon  Anesthesia Plan Comments: (Discussed risks of anesthesia with patient, including possibility of difficulty with spontaneous ventilation under anesthesia necessitating airway intervention, PONV, post operative cognitive dysfunction, and rare risks such as cardiac or respiratory or neurological events, and allergic reactions. Discussed the role of CRNA in patient's perioperative care. Patient understands.)       Anesthesia Quick Evaluation

## 2023-04-14 NOTE — Progress Notes (Signed)
Progress Note   Patient: [name] UJW:[id number] DOB: Nov 11, 1940 DOA: 04/12/2023     2 DOS: the patient was seen and examined on 04/14/2023     Brief hospital course: [name] is a 82 y.o. female with medical history significant of previous GI bleed in the setting of gastritis and duodenal ectasia s/p embolization, iron deficiency anemia, dementia, COPD, rheumatoid arthritis, B12 deficiency, hypothyroidism, who presents to the ED due to rectal bleeding.     Assessment and Plan:    Acute GI bleeding Gastroenterologist on board and case discussed Upper GI endoscopy was done on 04/14/2023 which was normal Colonoscopy was done on 04/14/2023 that showed diverticulosis of the sigmoid and descending colon with evidence of fresh bleeding Continue soft diet According to gastroenterologist if rebleeding occurs to consider CT angio or IR guided embolization Continue Protonix 40 mg IV twice daily   Acute blood loss anemia Previous hemoglobin of 12.0 approximately 5 weeks ago, currently 8.5.  Given tachycardia, 1 unit of packed RBC ordered in the ED. Status post 1 unit of packed RBC transfusion Hemoglobin today 7.2 we will give 1 more unit of packed RBC Monitor CBC closely   Mild cognitive impairment with memory loss Continue home galantamine and rivastigmine   Weight loss Chronic issue that patient has been following with GI at [facility]. Dietitian consulted   Pernicious anemia Receives outpatient B12 injections.  Last B12 above 1500 approximately 3 months ago.   RA (rheumatoid arthritis) (HCC) Patient previously on methotrexate, Simponi and Cimzia injections per chart review. Holding at this time.    COPD (chronic obstructive pulmonary disease) (HCC) Continue as needed nebulization   Hypothyroid Continue Synthroid   Advance Care Planning:   Code Status: Full Code verified by patient   Consults: GI   Family Communication: No family at bedside, will update brother  via telephone per patient's request.        Subjective:  Patient seen and examined at bedside this morning Underwent EGD and colonoscopy today Denies nausea vomiting or ongoing bleeding per rectum   Physical Exam:   Appearance: She is underweight.  HENT:     Head: Normocephalic and atraumatic.     Mouth/Throat:     Mouth: Mucous membranes are moist.     Pharynx: Oropharynx is clear.  Cardiovascular:     Rate and Rhythm: Frequent PVCs noted Pulmonary:     Effort: Pulmonary effort is normal.    Breath sounds: Normal breath sounds. No wheezing or rales.  Abdominal:     General: Bowel sounds are normal.     Palpations: Abdomen is soft.  Musculoskeletal:     Right lower leg: No edema.     Left lower leg: No edema.  Skin:    General: Skin is warm and dry.  Neurological:     Mental Status: She is alert.     Data Reviewed:     Latest Ref Rng & Units 04/14/2023    4:13 AM 04/13/2023    3:28 AM 04/12/2023    5:17 PM  CBC  WBC 4.0 - 10.5 K/uL 6.8  6.3  6.6   Hemoglobin 12.0 - 15.0 g/dL 7.2  7.7  9.1   Hematocrit 36.0 - 46.0 % 21.2  22.1  27.0   Platelets 150 - 400 K/uL 186  222  218        Latest Ref Rng & Units 04/14/2023    4:13 AM 04/13/2023    3:28 AM 04/12/2023  9:10 AM  BMP  Glucose 70 - 99 mg/dL 83  99  951   BUN 8 - 23 mg/dL 13  14  22    Creatinine 0.44 - 1.00 mg/dL 8.84  1.66  0.63   Sodium 135 - 145 mmol/L 137  138  137   Potassium 3.5 - 5.1 mmol/L 3.9  3.4  3.0   Chloride 98 - 111 mmol/L 110  111  105   CO2 22 - 32 mmol/L 22  23  25    Calcium 8.9 - 10.3 mg/dL 7.7  7.4  8.1     Vitals:   04/14/23 1202 04/14/23 1222 04/14/23 1345 04/14/23 1528  BP: 126/70 138/76 (!) 141/80   Pulse: 72  78   Resp: 15 17 16    Temp:   98.4 F (36.9 C)   TempSrc:   Oral   SpO2: 100%  100%   Weight:    44 kg  Height:        Author: Loyce Dys, MD 04/14/2023 3:43 PM  For on call review [email or web address].

## 2023-04-14 NOTE — Plan of Care (Signed)
?  Problem: Clinical Measurements: ?Goal: Cardiovascular complication will be avoided ?Outcome: Progressing ?  ?Problem: Nutrition: ?Goal: Adequate nutrition will be maintained ?Outcome: Progressing ?  ?Problem: Elimination: ?Goal: Will not experience complications related to bowel motility ?Outcome: Progressing ?  ?

## 2023-04-14 NOTE — Op Note (Signed)
[facility] Gastroenterology Patient Name: [name] Procedure Date: 04/14/2023 11:19 AM MRN: [id number] Account #: [id number] Date of Birth: [birth year] Admit Type: Inpatient Age: 82 Room: [facility] ENDO ROOM 4 Gender: Female Note Status: Finalized Instrument Name: Upper Endoscope [id number] Procedure:             Upper GI endoscopy Indications:           Hematochezia Providers:             Midge Minium MD, MD Referring MD:          Danella Penton, MD (Referring MD) Medicines:             Propofol per Anesthesia Complications:         No immediate complications. Procedure:             Pre-Anesthesia Assessment:                        - Prior to the procedure, a History and Physical was                         performed, and patient medications and allergies were                         reviewed. The patient's tolerance of previous                         anesthesia was also reviewed. The risks and benefits                         of the procedure and the sedation options and risks                         were discussed with the patient. All questions were                         answered, and informed consent was obtained. Prior                         Anticoagulants: The patient has taken no anticoagulant                         or antiplatelet agents. ASA Grade Assessment: III - A                         patient with severe systemic disease. After reviewing                         the risks and benefits, the patient was deemed in                         satisfactory condition to undergo the procedure.                        After obtaining informed consent, the endoscope was                         passed under direct vision. Throughout the procedure,  the patient's blood pressure, pulse, and oxygen                         saturations were monitored continuously. The Endoscope                         was introduced through the mouth, and  advanced to the                         third part of duodenum. The upper GI endoscopy was                         accomplished without difficulty. The patient tolerated                         the procedure well. Findings:      The examined esophagus was normal.      The stomach was normal.      The examined duodenum was normal. Impression:            - Normal esophagus.                        - Normal stomach.                        - Normal examined duodenum.                        - No specimens collected. Recommendation:        - Discharge patient to home.                        - Resume previous diet.                        - Continue present medications.                        - Perform a colonoscopy today. Procedure Code(s):     --- Professional ---                        [phone], Esophagogastroduodenoscopy, flexible,                         transoral; diagnostic, including collection of                         specimen(s) by brushing or washing, when performed                         (separate procedure) Diagnosis Code(s):     --- Professional ---                        K92.1, Melena (includes Hematochezia) CPT copyright 2022 American Medical Association. All rights reserved. The codes documented in this report are preliminary and upon coder review may  be revised to meet current compliance requirements. Midge Minium MD, MD 04/14/2023 11:31:27 AM This report has been signed electronically. Number of Addenda: 0 Note Initiated On: 04/14/2023 11:19 AM Estimated Blood Loss:  Estimated blood loss: none.  [facility]

## 2023-04-14 NOTE — Consult Note (Signed)
PHARMACY CONSULT NOTE - ELECTROLYTES  Pharmacy Consult for Electrolyte Monitoring and Replacement   Recent Labs: Height: 5\' 4"  (162.6 cm) Weight: 47.4 kg (104 lb 8 oz) IBW/kg (Calculated) : 54.7 Estimated Creatinine Clearance: 41.3 mL/min (by C-G formula based on SCr of 0.73 mg/dL). Potassium (mmol/L)  Date Value  04/14/2023 3.9   Magnesium (mg/dL)  Date Value  16/03/9603 2.0   Calcium (mg/dL)  Date Value  54/02/8118 7.7 (L)   Albumin (g/dL)  Date Value  14/78/2956 3.2 (L)   Phosphorus (mg/dL)  Date Value  21/30/8657 3.5   Sodium (mmol/L)  Date Value  04/14/2023 137   Corrected Ca: 8.3 mg/dL  Assessment  [name] is a 82 y.o. female presenting with rectal bleeding. PMH significant for previous GI bleed ISO gastritis and duodenal ectasia s/p embolization, iron deficiency anemia, COPD, hypothyroidism and rheumatoid arthritis. Pharmacy has been consulted to monitor and replace electrolytes.  Diet: NPO  Goal of Therapy: Electrolytes WNL  Plan:  No replacement indicated at this time Check BMP with AM labs  Thank you for allowing pharmacy to be a part of this patient's care.  Barrie Folk, PharmD Clinical Pharmacist 04/14/2023 7:14 AM

## 2023-04-14 NOTE — Transfer of Care (Signed)
Immediate Anesthesia Transfer of Care Note  Patient: [name]  Procedure(s) Performed: ESOPHAGOGASTRODUODENOSCOPY (EGD) WITH PROPOFOL COLONOSCOPY WITH PROPOFOL  Patient Location: PACU  Anesthesia Type:General  Level of Consciousness: drowsy and patient cooperative  Airway & Oxygen Therapy: Patient Spontanous Breathing  Post-op Assessment: Report given to RN and Post -op Vital signs reviewed and stable  Post vital signs: stable  Last Vitals:  Vitals Value Taken Time  BP    Temp    Pulse    Resp    SpO2      Last Pain:  Vitals:   04/14/23 1034  TempSrc: Temporal  PainSc: 0-No pain         Complications: No notable events documented.

## 2023-04-14 NOTE — Op Note (Signed)
[facility] Gastroenterology Patient Name: [name] Procedure Date: 04/14/2023 11:18 AM MRN: [id number] Account #: [id number] Date of Birth: November 21, 1940 Admit Type: Inpatient Age: 82 Room: [facility] ENDO ROOM 4 Gender: Female Note Status: Finalized Instrument Name: Peds Colonoscope [id number] Procedure:             Colonoscopy Indications:           Hematochezia Providers:             Midge Minium MD, MD Referring MD:          Danella Penton, MD (Referring MD) Medicines:             Propofol per Anesthesia Complications:         No immediate complications. Procedure:             Pre-Anesthesia Assessment:                        - Prior to the procedure, a History and Physical was                         performed, and patient medications and allergies were                         reviewed. The patient's tolerance of previous                         anesthesia was also reviewed. The risks and benefits                         of the procedure and the sedation options and risks                         were discussed with the patient. All questions were                         answered, and informed consent was obtained. Prior                         Anticoagulants: The patient has taken no anticoagulant                         or antiplatelet agents. ASA Grade Assessment: III - A                         patient with severe systemic disease. After reviewing                         the risks and benefits, the patient was deemed in                         satisfactory condition to undergo the procedure.                        After obtaining informed consent, the colonoscope was                         passed under direct vision. Throughout the procedure,  the patient's blood pressure, pulse, and oxygen                         saturations were monitored continuously. The                         Colonoscope was introduced through the anus and                          advanced to the the cecum, identified by appendiceal                         orifice and ileocecal valve. Findings:      The perianal and digital rectal examinations were normal.      Multiple small-mouthed diverticula were found in the sigmoid colon and       descending colon.      There was blood seen in only the lower part of the colon where the most       diverticui were seen. Impression:            - Diverticulosis in the sigmoid colon and in the                         descending colon.                        - No specimens collected.                        - Likely a diverticular bleed.                        No other source seen.                        Fresh blood in the lower colon without any blood in                         the upper colon. Recommendation:        - Resume previous diet.                        - Return patient to hospital ward for ongoing care.                        - If further bleeding then CT angio vs vascular                         surgery for localization and embolization. Procedure Code(s):     --- Professional ---                        [phone], Colonoscopy, flexible; diagnostic, including                         collection of specimen(s) by brushing or washing, when                         performed (separate procedure) Diagnosis Code(s):     --- Professional ---  K92.1, Melena (includes Hematochezia) CPT copyright 2022 American Medical Association. All rights reserved. The codes documented in this report are preliminary and upon coder review may  be revised to meet current compliance requirements. Midge Minium MD, MD 04/14/2023 12:01:41 PM This report has been signed electronically. Number of Addenda: 0 Note Initiated On: 04/14/2023 11:18 AM Scope Withdrawal Time: 0 hours 8 minutes 21 seconds  Total Procedure Duration: 0 hours 13 minutes 17 seconds  Estimated Blood Loss:  Estimated blood loss: none.       [facility]

## 2023-04-14 NOTE — Progress Notes (Signed)
Initial Nutrition Assessment  DOCUMENTATION CODES:   Severe malnutrition in context of chronic illness  INTERVENTION:   Boost Breeze po TID, each supplement provides 250 kcal and 9 grams of protein  Magic cup TID with meals, each supplement provides 290 kcal and 9 grams of protein  MVI po daily   Dysphagia 3 diet   Pt at high refeed risk; recommend monitor potassium, magnesium and phosphorus labs daily until stable  Daily weights   NUTRITION DIAGNOSIS:   Severe Malnutrition related to chronic illness as evidenced by severe fat depletion, severe muscle depletion.  GOAL:   Patient will meet greater than or equal to 90% of their needs  MONITOR:   PO intake, Supplement acceptance, Labs, Weight trends, Skin, I & O's  REASON FOR ASSESSMENT:   Malnutrition Screening Tool    ASSESSMENT:   82 y/o female with h/o hypothyroidism, COPD, RA, chronic pain, duodenal ectasia s/p embolization, IBS-C, GERD, anxiety, TIA, IDA and HLD who is admitted with GIB.  Pt s/p EGD/colonoscopy today; pt noted to have likely diverticular bleed   Met with pt in room today. Pt reports poor appetite and oral intake at baseline; pt reports that she has never been a good eater. Pt reports IBS-C and chronic abdominal pain. Pt does have h/o duodenal ectasia. Pt reports that she drinks Boost Breeze at home. Pt's lunch tray is sitting untouched on her side table; pt reports that she could not eat as she does not have her dentures. RD discussed with pt the importance of adequate nutrition needed to preserve lean muscle. Pt requesting a mechanical soft diet. Recommend continue Boost. Pt is at refeed risk. Per chart, pt appears weight stable pta. Pt reports that she has always been thin and usually stays between 90-100lbs.   Medications reviewed and include: folic acid, synthroid  Labs reviewed: K 3.9 wnl P 3.5 wnl, Mg 2.0 wnl- 11/6 Hgb 7.2(L), Hct 21.2(L)  NUTRITION - FOCUSED PHYSICAL EXAM:  Flowsheet Row  Most Recent Value  Orbital Region Mild depletion  Upper Arm Region Severe depletion  Thoracic and Lumbar Region Severe depletion  Buccal Region Mild depletion  Temple Region Mild depletion  Clavicle Bone Region Moderate depletion  Clavicle and Acromion Bone Region Moderate depletion  Scapular Bone Region Severe depletion  Dorsal Hand Severe depletion  Patellar Region Severe depletion  Anterior Thigh Region Severe depletion  Posterior Calf Region Severe depletion  Edema (RD Assessment) None  Hair Reviewed  Eyes Reviewed  Mouth Reviewed  Skin Reviewed  Nails Reviewed   Diet Order:   Diet Order             DIET SOFT Room service appropriate? Yes; Fluid consistency: Thin  Diet effective now                  EDUCATION NEEDS:   Education needs have been addressed  Skin:  Skin Assessment: Reviewed RN Assessment  Last BM:  11/6- type 6  Height:   Ht Readings from Last 1 Encounters:  04/12/23 5\' 4"  (1.626 m)    Weight:   Wt Readings from Last 1 Encounters:  04/14/23 44 kg    Ideal Body Weight:  54.5 kg  BMI:  Body mass index is 16.65 kg/m.  Estimated Nutritional Needs:   Kcal:  1300-1500kcal/day  Protein:  65-75g/day  Fluid:  1.1-1.3L/day  Betsey Holiday MS, RD, LDN Please refer to [facility] for RD and/or RD on-call/weekend/after hours pager

## 2023-04-15 ENCOUNTER — Encounter: Payer: Self-pay | Admitting: Gastroenterology

## 2023-04-15 DIAGNOSIS — K922 Gastrointestinal hemorrhage, unspecified: Secondary | ICD-10-CM | POA: Diagnosis not present

## 2023-04-15 LAB — CBC WITH DIFFERENTIAL/PLATELET
Abs Immature Granulocytes: 0.02 10*3/uL (ref 0.00–0.07)
Abs Immature Granulocytes: 0.02 10*3/uL (ref 0.00–0.07)
Basophils Absolute: 0 10*3/uL (ref 0.0–0.1)
Basophils Absolute: 0 10*3/uL (ref 0.0–0.1)
Basophils Relative: 1 %
Basophils Relative: 1 %
Eosinophils Absolute: 0.3 10*3/uL (ref 0.0–0.5)
Eosinophils Absolute: 0.3 10*3/uL (ref 0.0–0.5)
Eosinophils Relative: 5 %
Eosinophils Relative: 4 %
HCT: 30.3 % — ABNORMAL LOW (ref 36.0–46.0)
HCT: 29.4 % — ABNORMAL LOW (ref 36.0–46.0)
Hemoglobin: 10.4 g/dL — ABNORMAL LOW (ref 12.0–15.0)
Hemoglobin: 9.7 g/dL — ABNORMAL LOW (ref 12.0–15.0)
Immature Granulocytes: 0 %
Immature Granulocytes: 0 %
Lymphocytes Relative: 25 %
Lymphocytes Relative: 19 %
Lymphs Abs: 1.6 10*3/uL (ref 0.7–4.0)
Lymphs Abs: 1.3 10*3/uL (ref 0.7–4.0)
MCH: 31 pg (ref 26.0–34.0)
MCH: 30.4 pg (ref 26.0–34.0)
MCHC: 34.3 g/dL (ref 30.0–36.0)
MCHC: 33 g/dL (ref 30.0–36.0)
MCV: 90.4 fL (ref 80.0–100.0)
MCV: 92.2 fL (ref 80.0–100.0)
Monocytes Absolute: 0.7 10*3/uL (ref 0.1–1.0)
Monocytes Absolute: 0.7 10*3/uL (ref 0.1–1.0)
Monocytes Relative: 11 %
Monocytes Relative: 10 %
Neutro Abs: 3.7 10*3/uL (ref 1.7–7.7)
Neutro Abs: 4.4 10*3/uL (ref 1.7–7.7)
Neutrophils Relative %: 58 %
Neutrophils Relative %: 66 %
Platelets: 190 10*3/uL (ref 150–400)
Platelets: 198 10*3/uL (ref 150–400)
RBC: 3.35 MIL/uL — ABNORMAL LOW (ref 3.87–5.11)
RBC: 3.19 MIL/uL — ABNORMAL LOW (ref 3.87–5.11)
RDW: 16.3 % — ABNORMAL HIGH (ref 11.5–15.5)
RDW: 16.4 % — ABNORMAL HIGH (ref 11.5–15.5)
WBC: 6.4 10*3/uL (ref 4.0–10.5)
WBC: 6.7 10*3/uL (ref 4.0–10.5)
nRBC: 0 % (ref 0.0–0.2)
nRBC: 0 % (ref 0.0–0.2)

## 2023-04-15 LAB — BPAM RBC
Blood Product Expiration Date: 202411172359
Blood Product Expiration Date: 202411262359
Blood Product Expiration Date: 202412022359
ISSUE DATE / TIME: 202411051104
ISSUE DATE / TIME: 202411060500
ISSUE DATE / TIME: 202411071822
Unit Type and Rh: 5100
Unit Type and Rh: 5100
Unit Type and Rh: 5100

## 2023-04-15 LAB — TYPE AND SCREEN
ABO/RH(D): O POS
Antibody Screen: NEGATIVE
Unit division: 0
Unit division: 0
Unit division: 0

## 2023-04-15 LAB — BASIC METABOLIC PANEL
Anion gap: 7 (ref 5–15)
BUN: 10 mg/dL (ref 8–23)
CO2: 24 mmol/L (ref 22–32)
Calcium: 8.2 mg/dL — ABNORMAL LOW (ref 8.9–10.3)
Chloride: 107 mmol/L (ref 98–111)
Creatinine, Ser: 0.71 mg/dL (ref 0.44–1.00)
GFR, Estimated: >60 mL/min (ref >60)
Glucose, Bld: 102 mg/dL — ABNORMAL HIGH (ref 70–99)
Potassium: 4 mmol/L (ref 3.5–5.1)
Sodium: 138 mmol/L (ref 135–145)

## 2023-04-15 LAB — MAGNESIUM: Magnesium: 2 mg/dL (ref 1.7–2.4)

## 2023-04-15 LAB — PHOSPHORUS: Phosphorus: 4.4 mg/dL (ref 2.5–4.6)

## 2023-04-15 MED ORDER — TRAMADOL HCL 50 MG PO TABS
50.0000 mg | ORAL_TABLET | Freq: Once | ORAL | Status: DC
  Filled 2023-04-15: qty 1

## 2023-04-15 MED ORDER — CYCLOBENZAPRINE HCL 10 MG PO TABS
5.0000 mg | ORAL_TABLET | Freq: Once | ORAL | Status: AC
  Administered 2023-04-15: 5 mg via ORAL
  Filled 2023-04-15: qty 1

## 2023-04-15 NOTE — Progress Notes (Signed)
Progress Note   Patient: [name] WNU:[id number] DOB: [birth year] DOA: 04/12/2023     3 DOS: the patient was seen and examined on 04/15/2023      Brief hospital course: Sakira June Klingshirn is a 82 y.o. female with medical history significant of previous GI bleed in the setting of gastritis and duodenal ectasia s/p embolization, iron deficiency anemia, dementia, COPD, rheumatoid arthritis, B12 deficiency, hypothyroidism, who presents to the ED due to rectal bleeding.     Assessment and Plan:    Acute GI bleeding Gastroenterologist on board and case discussed Upper GI endoscopy was done on 04/14/2023 which was normal Colonoscopy was done on 04/14/2023 that showed diverticulosis of the sigmoid and descending colon with evidence of fresh bleeding Advance diet as tolerated According to gastroenterologist if rebleeding occurs to consider CT angio or IR guided embolization Continue Protonix 40 mg IV twice daily Repeat CBC today  acute blood loss anemia Previous hemoglobin of 12.0 approximately 5 weeks ago, currently 8.5.  Given tachycardia, 1 unit of packed RBC ordered in the ED. Status post 2 unit of packed RBC transfusion Patient received 1 unit of blood transfusion on 04/14/2023   Mild cognitive impairment with memory loss Continue home galantamine and rivastigmine   Weight loss Chronic issue that patient has been following with GI at [facility] Dietitian consulted   Pernicious anemia Receives outpatient B12 injections.  Last B12 above 1500 approximately 3 months ago.   RA (rheumatoid arthritis) (HCC) Patient previously on methotrexate, Simponi and Cimzia injections per chart review. Holding at this time.    COPD (chronic obstructive pulmonary disease) (HCC) Continue as needed nebulization   Hypothyroid Continue Synthroid   Advance Care Planning:   Code Status: Full Code verified by patient   Consults: GI   Family Communication: No family at bedside, will update brother  via telephone per patient's request.        Subjective:  Patient underwent EGD and colonoscopy yesterday She tells me that she had an episode of bloody stool this morning Unsure if this is related to old bleed We will continue to monitor CBC closely   Physical Exam:   Appearance: She is underweight.  HENT:     Head: Normocephalic and atraumatic.     Mouth/Throat:     Mouth: Mucous membranes are moist.     Pharynx: Oropharynx is clear.  Cardiovascular:     Rate and Rhythm: Frequent PVCs noted Pulmonary:     Effort: Pulmonary effort is normal.    Breath sounds: Normal breath sounds. No wheezing or rales.  Abdominal:     General: Bowel sounds are normal.     Palpations: Abdomen is soft.  Musculoskeletal:     Right lower leg: No edema.     Left lower leg: No edema.  Skin:    General: Skin is warm and dry.  Neurological:     Mental Status: She is alert.      Data Reviewed:    Latest Ref Rng & Units 04/15/2023    5:04 AM 04/14/2023    4:13 AM 04/13/2023    3:28 AM  CBC  WBC 4.0 - 10.5 K/uL 6.4  6.8  6.3   Hemoglobin 12.0 - 15.0 g/dL 03.4  7.2  7.7   Hematocrit 36.0 - 46.0 % 30.3  21.2  22.1   Platelets 150 - 400 K/uL 190  186  222        Latest Ref Rng & Units 04/15/2023    5:04  AM 04/14/2023    4:13 AM 04/13/2023    3:28 AM  BMP  Glucose 70 - 99 mg/dL 962  83  99   BUN 8 - 23 mg/dL 10  13  14    Creatinine 0.44 - 1.00 mg/dL 9.52  8.41  3.24   Sodium 135 - 145 mmol/L 138  137  138   Potassium 3.5 - 5.1 mmol/L 4.0  3.9  3.4   Chloride 98 - 111 mmol/L 107  110  111   CO2 22 - 32 mmol/L 24  22  23    Calcium 8.9 - 10.3 mg/dL 8.2  7.7  7.4     Vitals:   04/14/23 2216 04/15/23 0500 04/15/23 0643 04/15/23 0754  BP: 125/82  (!) 144/85 112/68  Pulse: 79  76 72  Resp: 17  16 16   Temp: 98.4 F (36.9 C)  97.9 F (36.6 C) 98 F (36.7 C)  TempSrc: Oral  Oral Axillary  SpO2: 99%  95% 99%  Weight:  45.2 kg    Height:        Author: Loyce Dys, MD 04/15/2023 2:17  PM  For on call review [email or web address].

## 2023-04-15 NOTE — Care Management Important Message (Signed)
Important Message  Patient Details  Name: Carrissa Neeper MRN: [id number] Date of Birth: 12-03-1940   Important Message Given:  Yes - Medicare IM     Olegario Messier A Dusti Tetro 04/15/2023, 3:15 PM

## 2023-04-15 NOTE — Consult Note (Signed)
PHARMACY CONSULT NOTE - ELECTROLYTES  Pharmacy Consult for Electrolyte Monitoring and Replacement   Recent Labs: Height: 5\' 4"  (162.6 cm) Weight: 45.2 kg (99 lb 10.4 oz) IBW/kg (Calculated) : 54.7 Estimated Creatinine Clearance: 39.4 mL/min (by C-G formula based on SCr of 0.71 mg/dL). Potassium (mmol/L)  Date Value  04/15/2023 4.0   Magnesium (mg/dL)  Date Value  16/03/9603 2.0   Calcium (mg/dL)  Date Value  54/02/8118 8.2 (L)   Albumin (g/dL)  Date Value  14/78/2956 3.2 (L)   Phosphorus (mg/dL)  Date Value  21/30/8657 4.4   Sodium (mmol/L)  Date Value  04/15/2023 138   Corrected Ca: 8.3 mg/dL  Assessment  [name] is a 82 y.o. female presenting with rectal bleeding. PMH significant for previous GI bleed ISO gastritis and duodenal ectasia s/p embolization, iron deficiency anemia, COPD, hypothyroidism and rheumatoid arthritis. Pharmacy has been consulted to monitor and replace electrolytes.  Diet: NPO  Goal of Therapy: Electrolytes WNL  Plan:  No replacement indicated at this time Check BMP with AM labs  Thank you for allowing pharmacy to be a part of this patient's care.  Barrie Folk, PharmD Clinical Pharmacist 04/15/2023 7:15 AM

## 2023-04-16 DIAGNOSIS — K922 Gastrointestinal hemorrhage, unspecified: Secondary | ICD-10-CM | POA: Diagnosis not present

## 2023-04-16 LAB — CBC WITH DIFFERENTIAL/PLATELET
Abs Immature Granulocytes: 0.02 10*3/uL (ref 0.00–0.07)
Basophils Absolute: 0 10*3/uL (ref 0.0–0.1)
Basophils Relative: 1 %
Eosinophils Absolute: 0.4 10*3/uL (ref 0.0–0.5)
Eosinophils Relative: 6 %
HCT: 28.3 % — ABNORMAL LOW (ref 36.0–46.0)
Hemoglobin: 9.6 g/dL — ABNORMAL LOW (ref 12.0–15.0)
Immature Granulocytes: 0 %
Lymphocytes Relative: 23 %
Lymphs Abs: 1.5 10*3/uL (ref 0.7–4.0)
MCH: 30.9 pg (ref 26.0–34.0)
MCHC: 33.9 g/dL (ref 30.0–36.0)
MCV: 91 fL (ref 80.0–100.0)
Monocytes Absolute: 0.7 10*3/uL (ref 0.1–1.0)
Monocytes Relative: 11 %
Neutro Abs: 3.7 10*3/uL (ref 1.7–7.7)
Neutrophils Relative %: 59 %
Platelets: 207 10*3/uL (ref 150–400)
RBC: 3.11 MIL/uL — ABNORMAL LOW (ref 3.87–5.11)
RDW: 16.1 % — ABNORMAL HIGH (ref 11.5–15.5)
WBC: 6.3 10*3/uL (ref 4.0–10.5)
nRBC: 0 % (ref 0.0–0.2)

## 2023-04-16 LAB — BASIC METABOLIC PANEL
Anion gap: 8 (ref 5–15)
BUN: 13 mg/dL (ref 8–23)
CO2: 26 mmol/L (ref 22–32)
Calcium: 7.9 mg/dL — ABNORMAL LOW (ref 8.9–10.3)
Chloride: 105 mmol/L (ref 98–111)
Creatinine, Ser: 0.83 mg/dL (ref 0.44–1.00)
GFR, Estimated: >60 mL/min (ref >60)
Glucose, Bld: 116 mg/dL — ABNORMAL HIGH (ref 70–99)
Potassium: 3.8 mmol/L (ref 3.5–5.1)
Sodium: 139 mmol/L (ref 135–145)

## 2023-04-16 LAB — MAGNESIUM: Magnesium: 2.3 mg/dL (ref 1.7–2.4)

## 2023-04-16 LAB — PHOSPHORUS: Phosphorus: 5.7 mg/dL — ABNORMAL HIGH (ref 2.5–4.6)

## 2023-04-16 MED ORDER — DOCUSATE SODIUM 100 MG PO CAPS: 100.0000 mg | ORAL_CAPSULE | Freq: Two times a day (BID) | ORAL | 2 refills | Status: AC

## 2023-04-16 NOTE — Discharge Summary (Signed)
Physician Discharge Summary   Patient: [name] MRN: [id number] DOB: Apr 25, 1941  Admit date:     04/12/2023  Discharge date: 04/16/23  Discharge Physician: [name]   PCP: Pamela Penton, MD   Recommendations at discharge:  Follow-up with PCP on gastroenterologist  Discharge Diagnoses: Acute GI bleeding acute blood loss anemia Mild cognitive impairment with memory loss Weight loss Pernicious anemia RA (rheumatoid arthritis) (HCC) COPD (chronic obstructive pulmonary disease) [facility]) Hypothyroid    Hospital Course: [name] is a 82 y.o. female with medical history significant of previous GI bleed in the setting of gastritis and duodenal ectasia s/p embolization, iron deficiency anemia, dementia, COPD, rheumatoid arthritis, B12 deficiency, hypothyroidism, who presents to the ED due to rectal bleeding.  Patient was seen by gastroenterologist and underwent EGD that was normal.  Patient also underwent colonoscopy that found diverticulosis with no active bleeding.  Patient was monitored for 48 hours post colonoscopy with no findings of active bleeding and therefore being discharged home to follow-up with PCP and her gastroenterologist.    Consultants: gastroEnterologist Procedures performed: EGD colonoscopy Disposition: Home Diet recommendation:  Cardiac diet DISCHARGE MEDICATION: Allergies as of 04/16/2023       Reactions   Other Other (See Comments)   Uncoded Allergy. Allergen: symmetral Uncoded Allergy. Allergen: etrafon Uncoded Allergy. Allergen: desbutal Uncoded Allergy. Allergen: symmetral Uncoded Allergy. Allergen: etrafon Uncoded Allergy. Allergen: desbutal Other reaction(s): Unknown Uncoded Allergy. Allergen: symmetral Other reaction(s): Unknown Uncoded Allergy. Allergen: etrafon Uncoded Allergy. Allergen: symmetral Uncoded Allergy. Allergen: etrafon Uncoded Allergy. Allergen: desbutal   Sulfa Antibiotics    Other reaction(s):  Unknown Uncoded Allergy. Allergen: desbutal   Etrafon [perphenazine-amitriptyline]    Hydrocodone Nausea And Vomiting   Mirtazapine    Other reaction(s): Hallucination   Denosumab Itching   Doxycycline Nausea Only   Oxycodone-acetaminophen Nausea And Vomiting, Other (See Comments)   Other reaction(s): Other (See Comments)   Percocet [oxycodone-acetaminophen] Nausea And Vomiting   Sertraline Palpitations        Medication List     STOP taking these medications    buPROPion 150 MG 24 hr tablet Commonly known as: WELLBUTRIN XL   dicyclomine 10 MG capsule Commonly known as: BENTYL   etodolac 400 MG tablet Commonly known as: LODINE   galantamine 8 MG tablet Commonly known as: RAZADYNE   lidocaine 5 % Commonly known as: Lidoderm       TAKE these medications    acetaminophen-codeine 300-30 MG tablet Commonly known as: TYLENOL #3 Take 1 tablet by mouth every 6 (six) hours as needed.   aspirin EC 81 MG tablet Take 81 mg by mouth daily.   atorvastatin 10 MG tablet Commonly known as: LIPITOR Take 10 mg by mouth daily.   certolizumab pegol 2 X 200 MG Kit Commonly known as: CIMZIA Inject into the skin.   clobetasol 0.05 % external solution Commonly known as: TEMOVATE APPLY TO SCALP DAILY AS NEEDED FOR ITCHING.   docusate sodium 100 MG capsule Commonly known as: Colace Take 1 capsule (100 mg total) by mouth 2 (two) times daily.   DULoxetine 20 MG capsule Commonly known as: CYMBALTA Take 20 mg by mouth 2 (two) times daily.   folic acid 1 MG tablet Commonly known as: FOLVITE Take 1 tablet by mouth daily.   gentamicin ointment 0.1 % Commonly known as: GARAMYCIN Apply 1 Application topically 2 (two) times daily.   hyoscyamine 0.125 MG SL tablet Commonly known as: LEVSIN SL Place under the tongue every  4 (four) hours as needed.   levothyroxine 50 MCG tablet Commonly known as: SYNTHROID Take 50 mcg by mouth daily.   magic mouthwash (nystatin,  hydrocortisone, diphenhydrAMINE, lidocaine) suspension SWISH AND SPIT OUT 1 TABLESPOONFUL ( ) FOUR TIMES DAILY   megestrol 20 MG tablet Commonly known as: MEGACE Take 20 mg by mouth daily.   methotrexate 50 MG/2ML injection Inject 0.8 mLs into the muscle once a week. 0.8 Milliliter(s) Injection Once a Week   mupirocin ointment 2 % Commonly known as: BACTROBAN Apply 1 Application topically daily.   nystatin 100000 UNIT/ML suspension Commonly known as: MYCOSTATIN PLEASE SEE ATTACHED FOR DETAILED DIRECTIONS   nystatin 500000 units Tabs tablet Commonly known as: MYCOSTATIN Take by mouth.   omeprazole 40 MG capsule Commonly known as: PRILOSEC Take 40 mg by mouth 2 (two) times daily.   rivastigmine 3 MG capsule Commonly known as: EXELON Take 3 mg by mouth 2 (two) times daily.   senna-docusate 8.6-50 MG tablet Commonly known as: Senokot-S Take 2 tablets by mouth 2 (two) times daily as needed for mild constipation or moderate constipation.   Simponi Aria 50 MG/4ML Soln injection Generic drug: golimumab 2mg /kg Intravenous every 8 weeks   tiZANidine 2 MG tablet Commonly known as: ZANAFLEX Take 2 mg by mouth 2 (two) times daily.        Discharge Exam: Filed Weights   04/14/23 1034 04/14/23 1528 04/15/23 0500  Weight: 47.4 kg 44 kg 45.2 kg   Appearance: She is underweight.  HENT:     Head: Normocephalic and atraumatic.     Mouth/Throat:     Mouth: Mucous membranes are moist.     Pharynx: Oropharynx is clear.  Cardiovascular:     Rate and Rhythm: Frequent PVCs noted Pulmonary:     Effort: Pulmonary effort is normal.    Breath sounds: Normal breath sounds. No wheezing or rales.  Abdominal:     General: Bowel sounds are normal.     Palpations: Abdomen is soft.  Musculoskeletal:     Right lower leg: No edema.     Left lower leg: No edema.  Skin:    General: Skin is warm and dry.  Neurological:     Mental Status: She is alert.   Condition at discharge:  good  The results of significant diagnostics from this hospitalization (including imaging, microbiology, ancillary and laboratory) are listed below for reference.   Imaging Studies: CT ANGIO GI BLEED  Result Date: 04/12/2023 CLINICAL DATA:  Brisk bright red blood per rectum overnight EXAM: CTA ABDOMEN AND PELVIS WITHOUT AND WITH CONTRAST TECHNIQUE: Multidetector CT imaging of the abdomen and pelvis was performed using the standard protocol during bolus administration of intravenous contrast. Multiplanar reconstructed images and MIPs were obtained and reviewed to evaluate the vascular anatomy. RADIATION DOSE REDUCTION: This exam was performed according to the departmental dose-optimization program which includes automated exposure control, adjustment of the mA and/or kV according to patient size and/or use of iterative reconstruction technique. CONTRAST:  80mL OMNIPAQUE IOHEXOL 350 MG/ML SOLN COMPARISON:  CT pelvis dated 10/25/2022, CT abdomen and pelvis dated 07/13/2022 FINDINGS: VASCULAR Aorta: Aortic atherosclerosis with segmental irregularity, most notably in the infrarenal aorto bi-iliac region. No dissection, vasculitis, or significant stenosis. Focal saccular outpouching arising from the right lateral aspect of the suprarenal aorta measures 8 x 4 mm (8:40). Celiac: Patent without evidence of aneurysm, dissection, vasculitis or significant stenosis. SMA: Right hepatic artery arises from the SMA. Patent without evidence of aneurysm, dissection, vasculitis or significant stenosis.  Renals: 2 renal arteries bilaterally. Mild narrowing of the left renal artery origins due to atherosclerotic plaque. No evidence of aneurysm, dissection, or vasculitis. IMA: Patent without evidence of aneurysm, dissection, vasculitis or significant stenosis. Inflow: Segmental mild luminal narrowing of the bilateral common iliac arteries due to atherosclerotic plaque. No evidence of aneurysm, dissection, or vasculitis. Proximal  Outflow: Bilateral common femoral and visualized portions of the superficial and profunda femoral arteries are patent without evidence of aneurysm, dissection, vasculitis or significant stenosis. Veins: No obvious venous abnormality. Retroaortic left renal vein. Review of the MIP images confirms the above findings. NON-VASCULAR Lower chest: No focal consolidation or pulmonary nodule in the lung bases. No pleural effusion or pneumothorax demonstrated. Partially imaged heart size is normal. Hepatobiliary: No focal hepatic lesions. No intra or extrahepatic biliary ductal dilation. Cholecystectomy. Pancreas: No focal lesions or main ductal dilation. Spleen: Normal in size without focal abnormality. Adrenals/Urinary Tract: No adrenal nodules. No suspicious renal mass, calculi or hydronephrosis. No focal bladder wall thickening. Stomach/Bowel: Normal appearance of the stomach. Diffuse mural thickening of the sigmoid colon, where there is extensive diverticulosis. No abnormal bowel dilation. Normal appendix. Lymphatic: No enlarged abdominal or pelvic lymph nodes. Reproductive: No adnexal masses. Bilateral adnexal metallic radiodensities, likely related to tubal ligation. Other: Trace pelvic free fluid.  No free air or fluid collection. Musculoskeletal: No acute or abnormal lytic or blastic osseous lesions. Partially imaged postsurgical changes from right proximal femoral fixation. Hardware appears intact. Old fracture of the left superior pubic ramus. Multilevel degenerative changes of the partially imaged thoracic and lumbar spine. Unchanged wedging of L2 and L3. IMPRESSION: 1. No evidence of active GI bleed. 2. Diffuse mural thickening of the sigmoid colon, where there is extensive diverticulosis, likely reflecting sequela of prior diverticulitis. 3. 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 Recommend referral to or continued  care with vascular specialist. (Ref.: J Vasc Surg. 2018; 67:2-77 and J Am Coll Radiol 2013;10(10):789-794.) 4.  Aortic Atherosclerosis (ICD10-I70.0). Electronically Signed   By: Agustin Cree M.D.   On: 04/12/2023 13:02   MM 3D SCREENING MAMMOGRAM BILATERAL BREAST  Result Date: 04/07/2023 CLINICAL DATA:  Screening. EXAM: DIGITAL SCREENING BILATERAL MAMMOGRAM WITH TOMOSYNTHESIS AND CAD TECHNIQUE: Bilateral screening digital craniocaudal and mediolateral oblique mammograms were obtained. Bilateral screening digital breast tomosynthesis was performed. The images were evaluated with computer-aided detection. COMPARISON:  Previous exam(s). ACR Breast Density Category c: The breasts are heterogeneously dense, which may obscure small masses. FINDINGS: There are no findings suspicious for malignancy. IMPRESSION: No mammographic evidence of malignancy. A result letter of this screening mammogram will be mailed directly to the patient. RECOMMENDATION: Screening mammogram in one year. (Code:SM-B-01Y) BI-RADS CATEGORY  1: Negative. Electronically Signed   By: Amie Portland M.D.   On: 04/07/2023 13:31    Microbiology: Results for orders placed or performed during the hospital encounter of 01/21/22  Blood Culture (routine x 2)     Status: None   Collection Time: 01/21/22  3:17 PM   Specimen: BLOOD  Result Value Ref Range Status   Specimen Description BLOOD BLOOD LEFT FOREARM  Final   Special Requests   Final    BOTTLES DRAWN AEROBIC AND ANAEROBIC Blood Culture adequate volume   Culture   Final    NO GROWTH 6 DAYS Performed at [facility], [address], [place], [place] [address]    Report Status 01/27/2022 FINAL  Final  Blood Culture (routine x 2)     Status: None   Collection Time: 01/21/22  3:17 PM   Specimen: BLOOD  Result Value Ref Range Status   Specimen Description BLOOD BLOOD LEFT HAND  Final   Special Requests   Final    BOTTLES DRAWN AEROBIC AND ANAEROBIC Blood Culture results may not  be optimal due to an excessive volume of blood received in culture bottles   Culture   Final    NO GROWTH 6 DAYS Performed at [facility], [address] [place], [place] [address]    Report Status 01/27/2022 FINAL  Final  MRSA Next Gen by PCR, Nasal     Status: None   Collection Time: 01/21/22  8:26 PM   Specimen: Nasal Mucosa; Nasal Swab  Result Value Ref Range Status   MRSA by PCR Next Gen NOT DETECTED NOT DETECTED Final    Comment: (NOTE) The GeneXpert MRSA Assay (FDA approved for NASAL specimens only), is one component of a comprehensive MRSA colonization surveillance program. It is not intended to diagnose MRSA infection nor to guide or monitor treatment for MRSA infections. Test performance is not FDA approved in patients less than 51 years old. Performed at [facility] [address] [place], [place] [address]   Urine Culture     Status: None   Collection Time: 01/22/22  6:01 AM   Specimen: Urine, Random  Result Value Ref Range Status   Specimen Description   Final    URINE, RANDOM Performed at [facility], [address], [place], [place] [address]    Special Requests   Final    NONE Performed at [facility], [address], [place], [place] [address]    Culture   Final    NO GROWTH Performed at [facility], [address], [place], [place] [address]    Report Status 01/23/2022 FINAL  Final    Labs: CBC: Recent Labs  Lab 04/13/23 0328 04/14/23 0413 04/15/23 0504 04/15/23 1456 04/16/23 0306  WBC 6.3 6.8 6.4 6.7 6.3  NEUTROABS 3.7 4.4 3.7 4.4 3.7  HGB 7.7* 7.2* 10.4* 9.7* 9.6*  HCT 22.1* 21.2* 30.3* 29.4* 28.3*  MCV 92.5 92.2 90.4 92.2 91.0  PLT 222 186 190 198 207   Basic Metabolic Panel: Recent Labs  Lab 04/12/23 0910 04/13/23 0328 04/14/23 0413 04/15/23 0504 04/16/23 0306  NA 137 138 137 138 139  K 3.0* 3.4* 3.9 4.0 3.8  CL 105 111 110 107 105  CO2 25 23 22 24 26   GLUCOSE 185* 99 83 102*  116*  BUN 22 14 13 10 13   CREATININE 0.87 0.73 0.73 0.71 0.83  CALCIUM 8.1* 7.4* 7.7* 8.2* 7.9*  MG 2.1 2.0  --  2.0 2.3  PHOS  --  3.5  --  4.4 5.7*   Liver Function Tests: Recent Labs  Lab 04/12/23 0910  AST 22  ALT 17  ALKPHOS 59  BILITOT 0.4  PROT 5.7*  ALBUMIN 3.2*   CBG: No results for input(s): "GLUCAP" in the last 168 hours.  Discharge time spent:  37 minutes.  Signed: Loyce Dys, MD [facility] 04/16/2023

## 2023-04-16 NOTE — Plan of Care (Signed)

## 2023-04-16 NOTE — Progress Notes (Signed)
Discharge instructions and medication details reviewed with patient and family members. Verbalized understanding. Printed AVS given to patient, IV removed. Patient escorted via wheelchair.

## 2023-04-16 NOTE — Consult Note (Signed)
PHARMACY CONSULT NOTE - ELECTROLYTES  Pharmacy Consult for Electrolyte Monitoring and Replacement   Recent Labs: Height: 5\' 4"  (162.6 cm) Weight: 45.2 kg (99 lb 10.4 oz) IBW/kg (Calculated) : 54.7 Estimated Creatinine Clearance: 37.9 mL/min (by C-G formula based on SCr of 0.83 mg/dL). Potassium (mmol/L)  Date Value  04/16/2023 3.8   Magnesium (mg/dL)  Date Value  09/81/1914 2.3   Calcium (mg/dL)  Date Value  78/29/5621 7.9 (L)   Albumin (g/dL)  Date Value  30/86/5784 3.2 (L)   Phosphorus (mg/dL)  Date Value  69/62/9528 5.7 (H)   Sodium (mmol/L)  Date Value  04/16/2023 139   Assessment  [name] is a 82 y.o. female presenting with rectal bleeding. PMH significant for previous GI bleed ISO gastritis and duodenal ectasia s/p embolization, iron deficiency anemia, COPD, hypothyroidism and rheumatoid arthritis. Pharmacy has been consulted to monitor and replace electrolytes.  Diet: Dysphagia 3  Goal of Therapy: Electrolytes WNL  Plan:  No replacement indicated at this time Follow-up labs tomorrow AM  Thank you for allowing pharmacy to be a part of this patient's care.  Tressie Ellis 04/16/2023 9:27 AM

## 2023-04-18 ENCOUNTER — Telehealth: Payer: Self-pay

## 2023-04-18 NOTE — Telephone Encounter (Signed)
Spoke with pt today regarding her hospital stay. Pt was wanting to speak with you after the procedure and before she went home but you were not available. Pt stated she is has since being home passing small blood clots. She hasn't had a bowel movement in several days. She asked if she could take colace

## 2023-04-21 NOTE — Telephone Encounter (Signed)
Patient verbalized understanding of instructions. She states she will call them to schedule a appointment and will get a bottle of magnesium citrate

## 2023-04-21 NOTE — Telephone Encounter (Signed)
Patient states she called on Monday, Wednesday and then today and have not got a call back. She states she was in a hospital last week for rectal bleeding and had a colonoscopy done. She was hoping to see the provider before she left the hospital that day to ask him some questions but he never came by. She states she has not had a bowel movement in 9 days. She states the hospital gave her a script for stool softener called Senokot and it has not helped. She states she she has took Miralax the last 4 days. She states last night she drunk water all night long  but still has not had a bowel movement. She is worries about having impacted. Is having middle abdominal pain that is aching right under her belly button all the away across but worse in the middle of her stomach under her belly button. She does take tylenol 3 every day for the rheumatoid arthritis . She wants to know what is recommend to have a bowel movement and then if the rectal bleeding comes back every day like it was what is recommended she needs to do?

## 2023-06-29 ENCOUNTER — Other Ambulatory Visit: Payer: Self-pay

## 2023-06-29 DIAGNOSIS — D509 Iron deficiency anemia, unspecified: Secondary | ICD-10-CM

## 2023-07-01 ENCOUNTER — Inpatient Hospital Stay: Payer: Medicare Other | Attending: Oncology

## 2023-07-13 ENCOUNTER — Inpatient Hospital Stay: Payer: Medicare Other | Attending: Oncology

## 2023-07-13 DIAGNOSIS — D509 Iron deficiency anemia, unspecified: Secondary | ICD-10-CM | POA: Insufficient documentation

## 2023-07-13 LAB — CBC (CANCER CENTER ONLY)
HCT: 37 % (ref 36.0–46.0)
Hemoglobin: 11.8 g/dL — ABNORMAL LOW (ref 12.0–15.0)
MCH: 28.3 pg (ref 26.0–34.0)
MCHC: 31.9 g/dL (ref 30.0–36.0)
MCV: 88.7 fL (ref 80.0–100.0)
Platelet Count: 318 10*3/uL (ref 150–400)
RBC: 4.17 MIL/uL (ref 3.87–5.11)
RDW: 14.8 % (ref 11.5–15.5)
WBC Count: 7.1 10*3/uL (ref 4.0–10.5)
nRBC: 0 % (ref 0.0–0.2)

## 2023-07-13 LAB — IRON AND TIBC
Iron: 35 ug/dL (ref 28–170)
Saturation Ratios: 8 % — ABNORMAL LOW (ref 10.4–31.8)
TIBC: 448 ug/dL (ref 250–450)
UIBC: 413 ug/dL

## 2023-07-13 LAB — FERRITIN: Ferritin: 9 ng/mL — ABNORMAL LOW (ref 11–307)

## 2023-07-14 ENCOUNTER — Other Ambulatory Visit: Payer: Self-pay | Admitting: Oncology

## 2023-07-18 ENCOUNTER — Inpatient Hospital Stay: Payer: Medicare Other

## 2023-07-18 VITALS — BP 126/77 | HR 78 | Temp 98.0°F | Resp 18

## 2023-07-18 DIAGNOSIS — D509 Iron deficiency anemia, unspecified: Secondary | ICD-10-CM | POA: Diagnosis not present

## 2023-07-18 MED ORDER — SODIUM CHLORIDE 0.9 % IV SOLN
INTRAVENOUS | Status: DC
  Filled 2023-07-18 (×2): qty 250

## 2023-07-18 MED ORDER — SODIUM CHLORIDE 0.9 % IV SOLN
1000.0000 mg | Freq: Once | INTRAVENOUS | Status: AC
  Administered 2023-07-18: 1000 mg via INTRAVENOUS
  Filled 2023-07-18: qty 1000

## 2023-07-18 NOTE — Patient Instructions (Signed)

## 2023-10-31 ENCOUNTER — Other Ambulatory Visit: Payer: Self-pay

## 2023-10-31 ENCOUNTER — Emergency Department

## 2023-10-31 ENCOUNTER — Emergency Department
Admission: EM | Admit: 2023-10-31 | Discharge: 2023-10-31 | Disposition: A | Discharge status: Home / Self Care | Attending: Emergency Medicine | Admitting: Emergency Medicine

## 2023-10-31 DIAGNOSIS — J449 Chronic obstructive pulmonary disease, unspecified: Secondary | ICD-10-CM | POA: Insufficient documentation

## 2023-10-31 DIAGNOSIS — R079 Chest pain, unspecified: Secondary | ICD-10-CM

## 2023-10-31 DIAGNOSIS — R072 Precordial pain: Secondary | ICD-10-CM | POA: Diagnosis present

## 2023-10-31 LAB — TROPONIN I (HIGH SENSITIVITY)
Troponin I (High Sensitivity): 14 ng/L (ref <18)
Troponin I (High Sensitivity): 14 ng/L (ref <18)

## 2023-10-31 LAB — CBC WITH DIFFERENTIAL/PLATELET
Abs Immature Granulocytes: 0.09 10*3/uL — ABNORMAL HIGH (ref 0.00–0.07)
Basophils Absolute: 0.1 10*3/uL (ref 0.0–0.1)
Basophils Relative: 0 %
Eosinophils Absolute: 0.2 10*3/uL (ref 0.0–0.5)
Eosinophils Relative: 2 %
HCT: 35.1 % — ABNORMAL LOW (ref 36.0–46.0)
Hemoglobin: 11.8 g/dL — ABNORMAL LOW (ref 12.0–15.0)
Immature Granulocytes: 1 %
Lymphocytes Relative: 20 %
Lymphs Abs: 2.5 10*3/uL (ref 0.7–4.0)
MCH: 33 pg (ref 26.0–34.0)
MCHC: 33.6 g/dL (ref 30.0–36.0)
MCV: 98 fL (ref 80.0–100.0)
Monocytes Absolute: 0.7 10*3/uL (ref 0.1–1.0)
Monocytes Relative: 6 %
Neutro Abs: 8.8 10*3/uL — ABNORMAL HIGH (ref 1.7–7.7)
Neutrophils Relative %: 71 %
Platelets: 290 10*3/uL (ref 150–400)
RBC: 3.58 MIL/uL — ABNORMAL LOW (ref 3.87–5.11)
RDW: 13.4 % (ref 11.5–15.5)
WBC: 12.3 10*3/uL — ABNORMAL HIGH (ref 4.0–10.5)
nRBC: 0 % (ref 0.0–0.2)

## 2023-10-31 LAB — URINALYSIS, W/ REFLEX TO CULTURE (INFECTION SUSPECTED)
Bilirubin Urine: NEGATIVE
Glucose, UA: NEGATIVE mg/dL
Hgb urine dipstick: NEGATIVE
Ketones, ur: 5 mg/dL — AB
Leukocytes,Ua: NEGATIVE
Nitrite: NEGATIVE
Protein, ur: NEGATIVE mg/dL
RBC / HPF: 0 RBC/hpf (ref 0–5)
Specific Gravity, Urine: 1.015 (ref 1.005–1.030)
pH: 5 (ref 5.0–8.0)

## 2023-10-31 LAB — COMPREHENSIVE METABOLIC PANEL WITH GFR
ALT: 19 U/L (ref 0–44)
AST: 26 U/L (ref 15–41)
Albumin: 3.9 g/dL (ref 3.5–5.0)
Alkaline Phosphatase: 60 U/L (ref 38–126)
Anion gap: 8 (ref 5–15)
BUN: 22 mg/dL (ref 8–23)
CO2: 23 mmol/L (ref 22–32)
Calcium: 8.4 mg/dL — ABNORMAL LOW (ref 8.9–10.3)
Chloride: 104 mmol/L (ref 98–111)
Creatinine, Ser: 0.74 mg/dL (ref 0.44–1.00)
GFR, Estimated: >60 mL/min (ref >60)
Glucose, Bld: 113 mg/dL — ABNORMAL HIGH (ref 70–99)
Potassium: 3.6 mmol/L (ref 3.5–5.1)
Sodium: 135 mmol/L (ref 135–145)
Total Bilirubin: 0.8 mg/dL (ref 0.0–1.2)
Total Protein: 6.5 g/dL (ref 6.5–8.1)

## 2023-10-31 LAB — BRAIN NATRIURETIC PEPTIDE: B Natriuretic Peptide: 103.7 pg/mL — ABNORMAL HIGH (ref 0.0–100.0)

## 2023-10-31 MED ORDER — GABAPENTIN 100 MG PO CAPS: 100.0000 mg | ORAL_CAPSULE | Freq: Three times a day (TID) | ORAL | 0 refills | Status: AC | PRN

## 2023-10-31 MED ORDER — KETOROLAC TROMETHAMINE 30 MG/ML IJ SOLN
15.0000 mg | Freq: Once | INTRAMUSCULAR | Status: AC
  Administered 2023-10-31: 15 mg via INTRAVENOUS
  Filled 2023-10-31: qty 1

## 2023-10-31 NOTE — ED Notes (Signed)
 MD back in room to reiterate discharge education.

## 2023-10-31 NOTE — Discharge Instructions (Addendum)
 Please use ibuprofen (Motrin) up to 800 mg every 8 hours, naproxen (Naprosyn) up to 500 mg every 12 hours, and/or acetaminophen (Tylenol) up to 4 g/day for any continued pain.  Please do not use this medication regimen for longer than 7 days

## 2023-10-31 NOTE — ED Notes (Signed)
 Pt provided with warm blankets and a pillow. Head of bed adjusted for comfort. No other comfort measures requested at this time.

## 2023-10-31 NOTE — ED Notes (Signed)
 Pt asleep prior to this RN entering room. Asked pt if I could call brother for discharge. Pt states she doesn't want to leave in the same state she arrived and wants a reason for her pain. Reiterated to pt that all emergent testing we had conducted was negative but pt continues to demand answers. Told pt I will ask MD to speak with her again.

## 2023-10-31 NOTE — ED Triage Notes (Signed)
 BIB [facility] from home where pt lives alone and called EMS for fall but then said central chest pain once EMS arrived. EMS reports pt refused aspirin  and IV. Pt yelling out whenever staff touches pt. A&Ox4.

## 2023-10-31 NOTE — ED Notes (Signed)
 Pt assisted calling brother for ride home and getting dressed at this time. Pt AOX4, NAD noted.

## 2023-10-31 NOTE — ED Provider Notes (Addendum)
 [facility] Provider Note   Event Date/Time   First MD Initiated Contact with Patient 10/31/23 [phone]     (approximate) History  Chest Pain  HPI [name] is a 83 y.o. female with a past medical history of COPD, tobacco abuse, chronic chest pain, rheumatoid arthritis who presents via EMS complaining of midsternal chest pain that is worse with palpation and deep inspiration.  Patient denies any other exacerbating or relieving factors.  Patient is not tried any medications for the symptoms. ROS: Patient currently denies any vision changes, tinnitus, difficulty speaking, facial droop, sore throat, shortness of breath, abdominal pain, nausea/vomiting/diarrhea, dysuria, or weakness/numbness/paresthesias in any extremity   Physical Exam  Triage Vital Signs: ED Triage Vitals [10/31/23 0226]  Encounter Vitals Group     BP      Systolic BP Percentile      Diastolic BP Percentile      Pulse      Resp (!) 24     Temp      Temp src      SpO2      Weight 94 lb 11.2 oz (43 kg)     Height 5\' 4"  (1.626 m)     Head Circumference      Peak Flow      Pain Score 10     Pain Loc      Pain Education      Exclude from Growth Chart    Most recent vital signs: Vitals:   10/31/23 0500 10/31/23 0530  BP: (!) 150/82 (!) 161/86  Pulse: 91 87  Resp:  16  Temp:    SpO2: 98% 98%   General: Awake, oriented x4. CV:  Good peripheral perfusion.  Resp:  Normal effort.  Abd:  No distention.  Other:  Elderly well-developed, well-nourished Caucasian female resting comfortably in no mild distress secondary to pain ED Results / Procedures / Treatments  Labs (all labs ordered are listed, but only abnormal results are displayed) Labs Reviewed  BRAIN NATRIURETIC PEPTIDE - Abnormal; Notable for the following components:      Result Value   B Natriuretic Peptide 103.7 (*)    All other components within normal limits  COMPREHENSIVE METABOLIC PANEL WITH GFR - Abnormal; Notable  for the following components:   Glucose, Bld 113 (*)    Calcium  8.4 (*)    All other components within normal limits  CBC WITH DIFFERENTIAL/PLATELET - Abnormal; Notable for the following components:   WBC 12.3 (*)    RBC 3.58 (*)    Hemoglobin 11.8 (*)    HCT 35.1 (*)    Neutro Abs 8.8 (*)    Abs Immature Granulocytes 0.09 (*)    All other components within normal limits  URINALYSIS, W/ REFLEX TO CULTURE (INFECTION SUSPECTED) - Abnormal; Notable for the following components:   Color, Urine YELLOW (*)    APPearance CLEAR (*)    Ketones, ur 5 (*)    Bacteria, UA RARE (*)    All other components within normal limits  TROPONIN I (HIGH SENSITIVITY)  TROPONIN I (HIGH SENSITIVITY)   EKG ED ECG REPORT I, Charleen Conn, the attending physician, personally viewed and interpreted this ECG. Date: 10/31/2023 EKG Time: 0227 Rate: 93 Rhythm: normal sinus rhythm QRS Axis: normal Intervals: normal ST/T Wave abnormalities: normal Narrative Interpretation: Normal sinus rhythm with premature ventricular contractions.  No evidence of acute ischemia RADIOLOGY ED MD interpretation: One-view portable chest x-ray interpreted by me shows no evidence  of acute abnormalities including no pneumonia, pneumothorax, or widened mediastinum -Agree with radiology assessment Official radiology report(s): DG Chest Port 1 View Result Date: 10/31/2023 CLINICAL DATA:  Status post fall with central chest pain. EXAM: PORTABLE CHEST 1 VIEW COMPARISON:  July 13, 2022 FINDINGS: The heart size and mediastinal contours are within normal limits. The lungs are hyperinflated with mild, stable biapical pleural thickening. No acute infiltrate, pleural effusion or pneumothorax is identified. No acute osseous abnormality is identified. IMPRESSION: No active cardiopulmonary disease. Electronically Signed   By: Virgle Grime M.D.   On: 10/31/2023 03:37   PROCEDURES: Critical Care performed: No Procedures MEDICATIONS  ORDERED IN ED: Medications  ketorolac (TORADOL) 30 MG/ML injection 15 mg (15 mg Intravenous Given 10/31/23 0458)   IMPRESSION / MDM / ASSESSMENT AND PLAN / ED COURSE  I reviewed the triage vital signs and the nursing notes.                             The patient is on the cardiac monitor to evaluate for evidence of arrhythmia and/or significant heart rate changes. Patient's presentation is most consistent with acute presentation with potential threat to life or bodily function. This patient presents with atypical chest pain, most likely secondary to musculoskeletal injury. Differential diagnosis includes rib fracture, costochondritis, sternal fracture. Low suspicion for ACS, acute PE, pericarditis / myocarditis, thoracic aortic dissection, pneumothorax, pneumonia or other acute infectious process. Presentation not consistent with other acute, emergent causes of chest pain at this time. No indication for cardiac enzyme testing. Plan to order CXR to evaluate for acute cardiopulmonary causes.  Plan: EKG, CXR, pain control  Dispo: Discharge home with home care   FINAL CLINICAL IMPRESSION(S) / ED DIAGNOSES   Final diagnoses:  Chest pain, unspecified type   Rx / DC Orders   ED Discharge Orders     None      Note:  This document was prepared using Dragon voice recognition software and may include unintentional dictation errors.   Charleen Conn, MD 10/31/23 Clementina Cutter    Auguste Tebbetts K, MD 10/31/23 [phone]

## 2023-11-01 ENCOUNTER — Telehealth: Payer: Self-pay | Admitting: Nurse Practitioner

## 2023-11-01 ENCOUNTER — Inpatient Hospital Stay: Payer: Medicare Other | Admitting: Nurse Practitioner

## 2023-11-01 ENCOUNTER — Inpatient Hospital Stay: Payer: Medicare Other

## 2023-11-01 NOTE — Telephone Encounter (Signed)
 Patient sent message through our answering service to cancel her 11/01/23 appt for Lab/NP and call back to reschedule.

## 2023-11-07 NOTE — Telephone Encounter (Signed)
 Pt spouse came into clinic to ask what the phone call was for. He stated pt had been in the hospital. I tried to r/s appt with the spouse and he stated that she is not doing well right now so he doesn't know when she will be able to come back. Pt has an appt with her pcp and pt spouse stated that he will call back when he knows more. I gave him a card with the [facility] scheduling phone number on it.

## 2023-11-08 ENCOUNTER — Other Ambulatory Visit: Payer: Self-pay | Admitting: Internal Medicine

## 2023-11-08 ENCOUNTER — Ambulatory Visit
Admission: RE | Admit: 2023-11-08 | Discharge: 2023-11-08 | Disposition: A | Discharge status: Home / Self Care | Source: Ambulatory Visit | Attending: Internal Medicine | Admitting: Internal Medicine

## 2023-11-08 DIAGNOSIS — R0781 Pleurodynia: Secondary | ICD-10-CM | POA: Diagnosis present

## 2023-11-09 ENCOUNTER — Other Ambulatory Visit: Payer: Self-pay | Admitting: Internal Medicine

## 2023-11-09 DIAGNOSIS — S22050A Wedge compression fracture of T5-T6 vertebra, initial encounter for closed fracture: Secondary | ICD-10-CM

## 2023-11-10 ENCOUNTER — Inpatient Hospital Stay: Admission: RE | Admit: 2023-11-10 | Source: Ambulatory Visit

## 2023-11-10 ENCOUNTER — Other Ambulatory Visit: Payer: Self-pay | Admitting: Interventional Radiology

## 2023-11-10 DIAGNOSIS — S22050A Wedge compression fracture of T5-T6 vertebra, initial encounter for closed fracture: Secondary | ICD-10-CM

## 2023-11-11 ENCOUNTER — Ambulatory Visit
Admission: RE | Admit: 2023-11-11 | Discharge: 2023-11-11 | Disposition: A | Discharge status: Home / Self Care | Source: Ambulatory Visit | Attending: Interventional Radiology | Admitting: Interventional Radiology

## 2023-11-11 DIAGNOSIS — S22050A Wedge compression fracture of T5-T6 vertebra, initial encounter for closed fracture: Secondary | ICD-10-CM | POA: Insufficient documentation

## 2023-11-14 NOTE — Discharge Instructions (Signed)

## 2023-11-15 ENCOUNTER — Inpatient Hospital Stay
Admission: RE | Admit: 2023-11-15 | Discharge: 2023-11-15 | Disposition: A | Discharge status: Home / Self Care | Source: Ambulatory Visit | Attending: Internal Medicine | Admitting: Internal Medicine

## 2024-02-16 ENCOUNTER — Emergency Department
Admission: EM | Admit: 2024-02-16 | Discharge: 2024-02-17 | Disposition: A | Discharge status: Home / Self Care | Attending: Emergency Medicine | Admitting: Emergency Medicine

## 2024-02-16 ENCOUNTER — Emergency Department

## 2024-02-16 ENCOUNTER — Other Ambulatory Visit: Payer: Self-pay

## 2024-02-16 ENCOUNTER — Encounter: Payer: Self-pay | Admitting: Emergency Medicine

## 2024-02-16 DIAGNOSIS — R1084 Generalized abdominal pain: Secondary | ICD-10-CM

## 2024-02-16 DIAGNOSIS — R1032 Left lower quadrant pain: Secondary | ICD-10-CM | POA: Insufficient documentation

## 2024-02-16 DIAGNOSIS — K5900 Constipation, unspecified: Secondary | ICD-10-CM

## 2024-02-16 DIAGNOSIS — J449 Chronic obstructive pulmonary disease, unspecified: Secondary | ICD-10-CM | POA: Insufficient documentation

## 2024-02-16 LAB — COMPREHENSIVE METABOLIC PANEL WITH GFR
ALT: 21 U/L (ref 0–44)
AST: 31 U/L (ref 15–41)
Albumin: 4.2 g/dL (ref 3.5–5.0)
Alkaline Phosphatase: 57 U/L (ref 38–126)
Anion gap: 11 (ref 5–15)
BUN: 31 mg/dL — ABNORMAL HIGH (ref 8–23)
CO2: 23 mmol/L (ref 22–32)
Calcium: 9 mg/dL (ref 8.9–10.3)
Chloride: 105 mmol/L (ref 98–111)
Creatinine, Ser: 1.2 mg/dL — ABNORMAL HIGH (ref 0.44–1.00)
GFR, Estimated: 45 mL/min — ABNORMAL LOW (ref >60)
Glucose, Bld: 126 mg/dL — ABNORMAL HIGH (ref 70–99)
Potassium: 3.5 mmol/L (ref 3.5–5.1)
Sodium: 139 mmol/L (ref 135–145)
Total Bilirubin: 1 mg/dL (ref 0.0–1.2)
Total Protein: 7 g/dL (ref 6.5–8.1)

## 2024-02-16 LAB — CBC
HCT: 33.5 % — ABNORMAL LOW (ref 36.0–46.0)
Hemoglobin: 11.4 g/dL — ABNORMAL LOW (ref 12.0–15.0)
MCH: 33.9 pg (ref 26.0–34.0)
MCHC: 34 g/dL (ref 30.0–36.0)
MCV: 99.7 fL (ref 80.0–100.0)
Platelets: 342 K/uL (ref 150–400)
RBC: 3.36 MIL/uL — ABNORMAL LOW (ref 3.87–5.11)
RDW: 13.5 % (ref 11.5–15.5)
WBC: 5.7 K/uL (ref 4.0–10.5)
nRBC: 0 % (ref 0.0–0.2)

## 2024-02-16 LAB — LIPASE, BLOOD: Lipase: 42 U/L (ref 11–51)

## 2024-02-16 MED ORDER — SODIUM CHLORIDE 0.9 % IV BOLUS
1000.0000 mL | Freq: Once | INTRAVENOUS | Status: AC
  Administered 2024-02-16: 1000 mL via INTRAVENOUS

## 2024-02-16 MED ORDER — IOHEXOL 300 MG/ML  SOLN
75.0000 mL | Freq: Once | INTRAMUSCULAR | Status: AC | PRN
  Administered 2024-02-16: 75 mL via INTRAVENOUS

## 2024-02-16 NOTE — ED Notes (Signed)
 Answered call light, pt was sitting on the toilet unable to advise urine specimen needed. Advised pt we need a urine specimen. Put a hat in the toilet and advised pt to try to cough and get us  a few drops. Pt ask for Dr. Cyrena to come in and speak with him. Dr.Wong came in to speak with pt as well and advised pt we need a urine specimen before we can dispo.

## 2024-02-16 NOTE — ED Triage Notes (Signed)
 Patient to ED via POV for lower abd pain with nausea. Family reports pt has been using suppositories to help with the pain. Ongoing x1 week. Not eating much.

## 2024-02-16 NOTE — ED Provider Notes (Signed)
 [facility] Provider Note    Event Date/Time   First MD Initiated Contact with Patient 02/16/24 2207     (approximate)  History   Chief Complaint: Abdominal Pain  HPI  [name] is a 83 y.o. female with a past medical history of anxiety, anemia, COPD, gastric reflux, presents to the emergency department for abdominal discomfort.  According to the patient and husband for the last 5 days she has been complaining of abdominal discomfort.  Patient has not been eating or drinking very much per family member.  Patient states sometimes the pain will be in lower abdomen and sometimes in the upper abdomen.  Patient denies any urinary symptoms.  No vomiting.  No diarrhea.  States she thought it might be due to constipation so she tried a suppository but this did not seem to help.  Physical Exam   Triage Vital Signs: ED Triage Vitals [02/16/24 1646]  Encounter Vitals Group     BP 111/76     Girls Systolic BP Percentile      Girls Diastolic BP Percentile      Boys Systolic BP Percentile      Boys Diastolic BP Percentile      Pulse Rate (!) 101     Resp 18     Temp 98.2 F (36.8 C)     Temp Source Oral     SpO2 100 %     Weight 86 lb (39 kg)     Height 5' (1.524 m)     Head Circumference      Peak Flow      Pain Score 7     Pain Loc      Pain Education      Exclude from Growth Chart     Most recent vital signs: Vitals:   02/16/24 1646  BP: 111/76  Pulse: (!) 101  Resp: 18  Temp: 98.2 F (36.8 C)  SpO2: 100%    General: Awake, no distress.  CV:  Good peripheral perfusion.  Regular rate and rhythm  Resp:  Normal effort.  Equal breath sounds bilaterally.  Abd:  No distention.  Soft, mild left lower quadrant tenderness otherwise benign abdomen.  No rebound or guarding.  ED Results / Procedures / Treatments   RADIOLOGY  CT pending   MEDICATIONS ORDERED IN ED: Medications  sodium chloride  0.9 % bolus 1,000 mL (has no administration in  time range)     IMPRESSION / MDM / ASSESSMENT AND PLAN / ED COURSE  I reviewed the triage vital signs and the nursing notes.  Patient's presentation is most consistent with acute presentation with potential threat to life or bodily function.  Patient presents to the emergency department for abdominal discomfort ongoing over the past 5 days with decreased oral intake.  Patient's lab work today shows reassuring CBC with a normal white blood cell count.  Overall reassuring chemistry with normal LFTs and lipase.  Given the patient's 5 days of symptoms and her age with left lower quadrant tenderness on exam we will obtain a CT scan of the abdomen and pelvis to further evaluate.  Differential would include constipation, colitis, diverticulitis, UTI pyelonephritis.  Urinalysis pending.  Patient agreeable to plan of care.  Overall the patient appears well.  CT scan pending, patient care signed out to oncoming provider.  FINAL CLINICAL IMPRESSION(S) / ED DIAGNOSES   Abdominal pain    Note:  This document was prepared using Dragon voice recognition software and may  include unintentional dictation errors.   Dorothyann Drivers, MD 02/17/24 2312

## 2024-02-17 DIAGNOSIS — R1032 Left lower quadrant pain: Secondary | ICD-10-CM | POA: Diagnosis not present

## 2024-02-17 LAB — URINALYSIS, ROUTINE W REFLEX MICROSCOPIC
Bilirubin Urine: NEGATIVE
Glucose, UA: NEGATIVE mg/dL
Hgb urine dipstick: NEGATIVE
Ketones, ur: NEGATIVE mg/dL
Leukocytes,Ua: NEGATIVE
Nitrite: NEGATIVE
Protein, ur: NEGATIVE mg/dL
Specific Gravity, Urine: 1.024 (ref 1.005–1.030)
pH: 5 (ref 5.0–8.0)

## 2024-02-17 NOTE — ED Provider Notes (Signed)
 Signed out pending urinalysis and CT scan both of which are unremarkable.  Patient appropriate for discharge.   Cyrena Mylar, MD 02/17/24 [phone]

## 2024-02-17 NOTE — Discharge Instructions (Signed)
 Fortunately your evaluation in the Emergency Department did not show any emergency conditions that require hospitalization or surgery at this time.  In case you are discomfort is due to constipation, take MiraLAX  2 capfuls daily to help make bowel movements.  Thank you for choosing us  for your health care today!  Please see your primary doctor this week for a follow up appointment.   If you have any new, worsening, or unexpected symptoms call your doctor right away or come back to the emergency department for reevaluation.  It was my pleasure to care for you today.   Ginnie EDISON Cyrena, MD

## 2024-03-28 ENCOUNTER — Other Ambulatory Visit: Payer: Self-pay | Admitting: Physician Assistant

## 2024-03-28 ENCOUNTER — Ambulatory Visit
Admission: RE | Admit: 2024-03-28 | Discharge: 2024-03-28 | Disposition: A | Source: Ambulatory Visit | Attending: Physician Assistant

## 2024-03-28 ENCOUNTER — Ambulatory Visit
Admission: RE | Admit: 2024-03-28 | Discharge: 2024-03-28 | Disposition: A | Discharge status: Home / Self Care | Source: Ambulatory Visit | Attending: Physician Assistant | Admitting: Physician Assistant

## 2024-03-28 DIAGNOSIS — G4486 Cervicogenic headache: Secondary | ICD-10-CM

## 2024-03-28 DIAGNOSIS — M5030 Other cervical disc degeneration, unspecified cervical region: Secondary | ICD-10-CM | POA: Insufficient documentation

## 2024-03-28 DIAGNOSIS — R42 Dizziness and giddiness: Secondary | ICD-10-CM | POA: Diagnosis present

## 2024-04-20 ENCOUNTER — Other Ambulatory Visit: Payer: Self-pay | Admitting: Family Medicine

## 2024-04-20 DIAGNOSIS — M5416 Radiculopathy, lumbar region: Secondary | ICD-10-CM

## 2024-04-23 ENCOUNTER — Encounter: Payer: Self-pay | Admitting: Oncology

## 2024-05-10 ENCOUNTER — Encounter: Payer: Self-pay | Admitting: Oncology

## 2024-05-10 ENCOUNTER — Inpatient Hospital Stay: Admission: RE | Admit: 2024-05-10 | Discharge: 2024-05-10 | Attending: Family Medicine | Admitting: Family Medicine

## 2024-05-10 DIAGNOSIS — M5416 Radiculopathy, lumbar region: Secondary | ICD-10-CM
